# Patient Record
Sex: Male | Born: 1935 | Race: White | Hispanic: No | Marital: Married | Smoking: Former smoker
Health system: Southern US, Community
[De-identification: ages and names within clinical notes are randomized; demographics above are authoritative.]

## PROBLEM LIST (undated history)

## (undated) ENCOUNTER — Emergency Department (HOSPITAL_COMMUNITY): Admission: EM | Payer: PRIVATE HEALTH INSURANCE | Source: Home / Self Care

## (undated) DIAGNOSIS — K56609 Unspecified intestinal obstruction, unspecified as to partial versus complete obstruction: Secondary | ICD-10-CM

## (undated) DIAGNOSIS — H409 Unspecified glaucoma: Secondary | ICD-10-CM

## (undated) DIAGNOSIS — R112 Nausea with vomiting, unspecified: Secondary | ICD-10-CM

## (undated) DIAGNOSIS — Z860101 Personal history of adenomatous and serrated colon polyps: Secondary | ICD-10-CM

## (undated) DIAGNOSIS — E860 Dehydration: Secondary | ICD-10-CM

## (undated) DIAGNOSIS — N179 Acute kidney failure, unspecified: Secondary | ICD-10-CM

## (undated) DIAGNOSIS — I639 Cerebral infarction, unspecified: Secondary | ICD-10-CM

## (undated) DIAGNOSIS — E538 Deficiency of other specified B group vitamins: Secondary | ICD-10-CM

## (undated) DIAGNOSIS — H269 Unspecified cataract: Secondary | ICD-10-CM

## (undated) DIAGNOSIS — G459 Transient cerebral ischemic attack, unspecified: Secondary | ICD-10-CM

## (undated) DIAGNOSIS — T7840XA Allergy, unspecified, initial encounter: Secondary | ICD-10-CM

## (undated) DIAGNOSIS — E785 Hyperlipidemia, unspecified: Secondary | ICD-10-CM

## (undated) DIAGNOSIS — K219 Gastro-esophageal reflux disease without esophagitis: Secondary | ICD-10-CM

## (undated) DIAGNOSIS — Z86010 Personal history of colonic polyps: Secondary | ICD-10-CM

## (undated) DIAGNOSIS — E7211 Homocystinuria: Secondary | ICD-10-CM

## (undated) DIAGNOSIS — C61 Malignant neoplasm of prostate: Secondary | ICD-10-CM

## (undated) DIAGNOSIS — K5090 Crohn's disease, unspecified, without complications: Secondary | ICD-10-CM

## (undated) DIAGNOSIS — Z9889 Other specified postprocedural states: Secondary | ICD-10-CM

## (undated) DIAGNOSIS — K5790 Diverticulosis of intestine, part unspecified, without perforation or abscess without bleeding: Secondary | ICD-10-CM

## (undated) HISTORY — PX: VASECTOMY: SHX75

## (undated) HISTORY — DX: Unspecified cataract: H26.9

## (undated) HISTORY — DX: Crohn's disease, unspecified, without complications: K50.90

## (undated) HISTORY — DX: Dehydration: E86.0

## (undated) HISTORY — DX: Hyperlipidemia, unspecified: E78.5

## (undated) HISTORY — DX: Deficiency of other specified B group vitamins: E53.8

## (undated) HISTORY — DX: Personal history of adenomatous and serrated colon polyps: Z86.0101

## (undated) HISTORY — PX: TONSILLECTOMY: SUR1361

## (undated) HISTORY — PX: COLONOSCOPY: SHX174

## (undated) HISTORY — DX: Diverticulosis of intestine, part unspecified, without perforation or abscess without bleeding: K57.90

## (undated) HISTORY — PX: OTHER SURGICAL HISTORY: SHX169

## (undated) HISTORY — PX: APPENDECTOMY: SHX54

## (undated) HISTORY — DX: Unspecified intestinal obstruction, unspecified as to partial versus complete obstruction: K56.609

## (undated) HISTORY — DX: Homocystinuria: E72.11

## (undated) HISTORY — DX: Unspecified glaucoma: H40.9

## (undated) HISTORY — PX: CATARACT EXTRACTION: SUR2

## (undated) HISTORY — PX: CHOLECYSTECTOMY: SHX55

## (undated) HISTORY — PX: POLYPECTOMY: SHX149

## (undated) HISTORY — DX: Allergy, unspecified, initial encounter: T78.40XA

## (undated) HISTORY — DX: Gastro-esophageal reflux disease without esophagitis: K21.9

## (undated) HISTORY — DX: Personal history of colonic polyps: Z86.010

## (undated) HISTORY — DX: Malignant neoplasm of prostate: C61

---

## 1985-02-18 ENCOUNTER — Encounter: Payer: Self-pay | Admitting: Internal Medicine

## 2000-12-05 ENCOUNTER — Encounter: Payer: Self-pay | Admitting: Internal Medicine

## 2000-12-05 ENCOUNTER — Ambulatory Visit (HOSPITAL_COMMUNITY): Admission: RE | Admit: 2000-12-05 | Discharge: 2000-12-05 | Payer: Self-pay | Admitting: Internal Medicine

## 2003-01-14 ENCOUNTER — Ambulatory Visit (HOSPITAL_COMMUNITY): Admission: RE | Admit: 2003-01-14 | Discharge: 2003-01-14 | Payer: Self-pay | Admitting: Internal Medicine

## 2004-02-07 ENCOUNTER — Ambulatory Visit: Payer: Self-pay | Admitting: Internal Medicine

## 2004-07-31 ENCOUNTER — Ambulatory Visit: Payer: Self-pay | Admitting: Internal Medicine

## 2004-08-14 ENCOUNTER — Ambulatory Visit: Payer: Self-pay | Admitting: Internal Medicine

## 2004-08-14 ENCOUNTER — Encounter (INDEPENDENT_AMBULATORY_CARE_PROVIDER_SITE_OTHER): Payer: Self-pay | Admitting: *Deleted

## 2005-02-12 ENCOUNTER — Ambulatory Visit: Payer: Self-pay | Admitting: Internal Medicine

## 2005-02-26 ENCOUNTER — Emergency Department (HOSPITAL_COMMUNITY): Admission: EM | Admit: 2005-02-26 | Discharge: 2005-02-26 | Payer: Self-pay | Admitting: Emergency Medicine

## 2005-03-02 ENCOUNTER — Inpatient Hospital Stay (HOSPITAL_COMMUNITY): Admission: EM | Admit: 2005-03-02 | Discharge: 2005-03-03 | Payer: Self-pay | Admitting: Emergency Medicine

## 2005-03-14 ENCOUNTER — Ambulatory Visit: Payer: Self-pay

## 2005-03-14 ENCOUNTER — Encounter: Payer: Self-pay | Admitting: Cardiology

## 2006-03-14 ENCOUNTER — Ambulatory Visit: Payer: Self-pay | Admitting: Internal Medicine

## 2007-02-17 ENCOUNTER — Ambulatory Visit: Payer: Self-pay | Admitting: Internal Medicine

## 2007-02-17 LAB — CONVERTED CEMR LAB: Sed Rate: 24 mm/hr — ABNORMAL HIGH (ref 0–20)

## 2007-02-20 ENCOUNTER — Encounter: Payer: Self-pay | Admitting: Internal Medicine

## 2008-02-24 ENCOUNTER — Telehealth (INDEPENDENT_AMBULATORY_CARE_PROVIDER_SITE_OTHER): Payer: Self-pay | Admitting: *Deleted

## 2008-03-23 DIAGNOSIS — E785 Hyperlipidemia, unspecified: Secondary | ICD-10-CM | POA: Insufficient documentation

## 2008-03-23 DIAGNOSIS — Z862 Personal history of diseases of the blood and blood-forming organs and certain disorders involving the immune mechanism: Secondary | ICD-10-CM | POA: Insufficient documentation

## 2008-03-23 DIAGNOSIS — E538 Deficiency of other specified B group vitamins: Secondary | ICD-10-CM | POA: Insufficient documentation

## 2008-03-23 DIAGNOSIS — E7210 Disorders of sulfur-bearing amino-acid metabolism, unspecified: Secondary | ICD-10-CM | POA: Insufficient documentation

## 2008-03-23 DIAGNOSIS — K5090 Crohn's disease, unspecified, without complications: Secondary | ICD-10-CM | POA: Insufficient documentation

## 2008-03-23 DIAGNOSIS — Z8719 Personal history of other diseases of the digestive system: Secondary | ICD-10-CM

## 2008-03-23 DIAGNOSIS — Z86010 Personal history of colonic polyps: Secondary | ICD-10-CM | POA: Insufficient documentation

## 2008-03-28 ENCOUNTER — Ambulatory Visit: Payer: Self-pay | Admitting: Internal Medicine

## 2008-03-28 LAB — CONVERTED CEMR LAB
ALT: 14 units/L (ref 0–53)
AST: 18 units/L (ref 0–37)
Albumin: 3 g/dL — ABNORMAL LOW (ref 3.5–5.2)
Alkaline Phosphatase: 43 units/L (ref 39–117)
BUN: 12 mg/dL (ref 6–23)
Basophils Absolute: 0.1 10*3/uL (ref 0.0–0.1)
Basophils Relative: 0.7 % (ref 0.0–3.0)
CO2: 29 meq/L (ref 19–32)
Calcium: 8.8 mg/dL (ref 8.4–10.5)
Chloride: 108 meq/L (ref 96–112)
Creatinine, Ser: 1.2 mg/dL (ref 0.4–1.5)
Eosinophils Absolute: 0.1 10*3/uL (ref 0.0–0.7)
Eosinophils Relative: 1.3 % (ref 0.0–5.0)
GFR calc Af Amer: 77 mL/min
GFR calc non Af Amer: 63 mL/min
Glucose, Bld: 87 mg/dL (ref 70–99)
HCT: 38.9 % — ABNORMAL LOW (ref 39.0–52.0)
Hemoglobin: 13.4 g/dL (ref 13.0–17.0)
Iron: 158 ug/dL (ref 42–165)
Lymphocytes Relative: 25.4 % (ref 12.0–46.0)
MCHC: 34.4 g/dL (ref 30.0–36.0)
MCV: 99.9 fL (ref 78.0–100.0)
Monocytes Absolute: 0.5 10*3/uL (ref 0.1–1.0)
Monocytes Relative: 7.3 % (ref 3.0–12.0)
Neutro Abs: 4.7 10*3/uL (ref 1.4–7.7)
Neutrophils Relative %: 65.3 % (ref 43.0–77.0)
Platelets: 272 10*3/uL (ref 150–400)
Potassium: 4.1 meq/L (ref 3.5–5.1)
RBC: 3.89 M/uL — ABNORMAL LOW (ref 4.22–5.81)
RDW: 16.4 % — ABNORMAL HIGH (ref 11.5–14.6)
Saturation Ratios: 35.2 % (ref 20.0–50.0)
Sodium: 142 meq/L (ref 135–145)
Total Bilirubin: 1.2 mg/dL (ref 0.3–1.2)
Total Protein: 6 g/dL (ref 6.0–8.3)
Transferrin: 320.3 mg/dL (ref >=212.0)
Vitamin B-12: 1117 pg/mL — ABNORMAL HIGH (ref 211–911)
WBC: 7.3 10*3/uL (ref 4.5–10.5)

## 2008-09-19 ENCOUNTER — Encounter: Payer: Self-pay | Admitting: Internal Medicine

## 2009-01-03 ENCOUNTER — Ambulatory Visit: Payer: Self-pay | Admitting: Internal Medicine

## 2009-01-03 LAB — CONVERTED CEMR LAB: Vitamin B-12: 349 pg/mL (ref 211–911)

## 2009-01-04 LAB — CONVERTED CEMR LAB
Basophils Absolute: 0 10*3/uL (ref 0.0–0.1)
Basophils Relative: 0.7 % (ref 0.0–3.0)
Eosinophils Absolute: 0.1 10*3/uL (ref 0.0–0.7)
Eosinophils Relative: 1.4 % (ref 0.0–5.0)
HCT: 32.1 % — ABNORMAL LOW (ref 39.0–52.0)
Hemoglobin: 11.2 g/dL — ABNORMAL LOW (ref 13.0–17.0)
Iron: 39 ug/dL — ABNORMAL LOW (ref 42–165)
Lymphocytes Relative: 37.6 % (ref 12.0–46.0)
Lymphs Abs: 2.6 10*3/uL (ref 0.7–4.0)
MCHC: 34.9 g/dL (ref 30.0–36.0)
MCV: 94.1 fL (ref 78.0–100.0)
Monocytes Absolute: 0.7 10*3/uL (ref 0.1–1.0)
Monocytes Relative: 9.7 % (ref 3.0–12.0)
Neutro Abs: 3.5 10*3/uL (ref 1.4–7.7)
Neutrophils Relative %: 50.6 % (ref 43.0–77.0)
Platelets: 270 10*3/uL (ref 150.0–400.0)
RBC: 3.42 M/uL — ABNORMAL LOW (ref 4.22–5.81)
RDW: 15.6 % — ABNORMAL HIGH (ref 11.5–14.6)
Saturation Ratios: 8 % — ABNORMAL LOW (ref 20.0–50.0)
Transferrin: 349.3 mg/dL (ref 212.0–360.0)
WBC: 6.9 10*3/uL (ref 4.5–10.5)

## 2009-01-05 ENCOUNTER — Encounter: Payer: Self-pay | Admitting: Internal Medicine

## 2009-02-27 ENCOUNTER — Ambulatory Visit: Payer: Self-pay | Admitting: Internal Medicine

## 2009-02-28 LAB — CONVERTED CEMR LAB
Basophils Absolute: 0 10*3/uL (ref 0.0–0.1)
Basophils Relative: 0.3 % (ref 0.0–3.0)
Eosinophils Absolute: 0.1 10*3/uL (ref 0.0–0.7)
Eosinophils Relative: 1.4 % (ref 0.0–5.0)
HCT: 36.3 % — ABNORMAL LOW (ref 39.0–52.0)
Hemoglobin: 12.2 g/dL — ABNORMAL LOW (ref 13.0–17.0)
Iron: 148 ug/dL (ref 42–165)
Lymphocytes Relative: 27 % (ref 12.0–46.0)
Lymphs Abs: 1.9 10*3/uL (ref 0.7–4.0)
MCHC: 33.7 g/dL (ref 30.0–36.0)
MCV: 99 fL (ref 78.0–100.0)
Monocytes Absolute: 0.6 10*3/uL (ref 0.1–1.0)
Monocytes Relative: 9.1 % (ref 3.0–12.0)
Neutro Abs: 4.4 10*3/uL (ref 1.4–7.7)
Neutrophils Relative %: 62.2 % (ref 43.0–77.0)
Platelets: 214 10*3/uL (ref 150.0–400.0)
RBC: 3.67 M/uL — ABNORMAL LOW (ref 4.22–5.81)
RDW: 17.5 % — ABNORMAL HIGH (ref 11.5–14.6)
Saturation Ratios: 33.2 % (ref 20.0–50.0)
Transferrin: 318.6 mg/dL (ref 212.0–360.0)
WBC: 7 10*3/uL (ref 4.5–10.5)

## 2009-03-17 ENCOUNTER — Encounter: Payer: Self-pay | Admitting: Internal Medicine

## 2009-04-04 ENCOUNTER — Telehealth: Payer: Self-pay | Admitting: Internal Medicine

## 2009-06-19 ENCOUNTER — Encounter: Payer: Self-pay | Admitting: Internal Medicine

## 2009-08-02 ENCOUNTER — Encounter (INDEPENDENT_AMBULATORY_CARE_PROVIDER_SITE_OTHER): Payer: Self-pay | Admitting: *Deleted

## 2009-09-18 ENCOUNTER — Ambulatory Visit: Payer: Self-pay | Admitting: Internal Medicine

## 2009-09-18 LAB — CONVERTED CEMR LAB
ALT: 12 units/L (ref 0–53)
AST: 19 units/L (ref 0–37)
Albumin: 3.1 g/dL — ABNORMAL LOW (ref 3.5–5.2)
Alkaline Phosphatase: 41 units/L (ref 39–117)
BUN: 12 mg/dL (ref 6–23)
Basophils Absolute: 0 10*3/uL (ref 0.0–0.1)
Basophils Relative: 0.3 % (ref 0.0–3.0)
CO2: 28 meq/L (ref 19–32)
Calcium: 8.6 mg/dL (ref 8.4–10.5)
Chloride: 107 meq/L (ref 96–112)
Creatinine, Ser: 1.1 mg/dL (ref 0.4–1.5)
Eosinophils Absolute: 0.1 10*3/uL (ref 0.0–0.7)
Eosinophils Relative: 1.2 % (ref 0.0–5.0)
GFR calc non Af Amer: 69.66 mL/min (ref >60)
Glucose, Bld: 101 mg/dL — ABNORMAL HIGH (ref 70–99)
HCT: 33 % — ABNORMAL LOW (ref 39.0–52.0)
Hemoglobin: 11.4 g/dL — ABNORMAL LOW (ref 13.0–17.0)
Iron: 413 ug/dL — ABNORMAL HIGH (ref 42–165)
Lymphocytes Relative: 30.2 % (ref 12.0–46.0)
Lymphs Abs: 2.1 10*3/uL (ref 0.7–4.0)
MCHC: 34.6 g/dL (ref 30.0–36.0)
MCV: 98.6 fL (ref 78.0–100.0)
Monocytes Absolute: 0.5 10*3/uL (ref 0.1–1.0)
Monocytes Relative: 7.1 % (ref 3.0–12.0)
Neutro Abs: 4.3 10*3/uL (ref 1.4–7.7)
Neutrophils Relative %: 61.2 % (ref 43.0–77.0)
Platelets: 214 10*3/uL (ref 150.0–400.0)
Potassium: 3.8 meq/L (ref 3.5–5.1)
RBC: 3.35 M/uL — ABNORMAL LOW (ref 4.22–5.81)
RDW: 16.4 % — ABNORMAL HIGH (ref 11.5–14.6)
Saturation Ratios: 95.5 % — ABNORMAL HIGH (ref 20.0–50.0)
Sodium: 141 meq/L (ref 135–145)
Total Bilirubin: 1.3 mg/dL — ABNORMAL HIGH (ref 0.3–1.2)
Total Protein: 5.7 g/dL — ABNORMAL LOW (ref 6.0–8.3)
Transferrin: 308.9 mg/dL (ref 212.0–360.0)
Vitamin B-12: 447 pg/mL (ref 211–911)
WBC: 6.9 10*3/uL (ref 4.5–10.5)

## 2009-09-27 ENCOUNTER — Ambulatory Visit: Payer: Self-pay | Admitting: Internal Medicine

## 2009-09-30 ENCOUNTER — Encounter: Payer: Self-pay | Admitting: Internal Medicine

## 2009-11-23 ENCOUNTER — Encounter: Payer: Self-pay | Admitting: Internal Medicine

## 2009-11-29 ENCOUNTER — Encounter: Payer: Self-pay | Admitting: Internal Medicine

## 2009-12-26 ENCOUNTER — Encounter: Payer: Self-pay | Admitting: Internal Medicine

## 2010-03-13 ENCOUNTER — Encounter: Payer: Self-pay | Admitting: Internal Medicine

## 2010-03-23 ENCOUNTER — Telehealth (INDEPENDENT_AMBULATORY_CARE_PROVIDER_SITE_OTHER): Payer: Self-pay | Admitting: *Deleted

## 2010-03-26 ENCOUNTER — Telehealth: Payer: Self-pay | Admitting: Internal Medicine

## 2010-03-28 ENCOUNTER — Encounter: Payer: Self-pay | Admitting: Internal Medicine

## 2010-03-31 ENCOUNTER — Encounter: Payer: Self-pay | Admitting: Neurology

## 2010-04-10 NOTE — Letter (Signed)
Summary: Colonoscopy Letter  [facility]  [place], [place] [address]   Phone: [phone]  Fax: [phone]      Aug 02, 2009 MRN: [id number]   Ricardo Villa [address] [place], [place]  [address]   Dear Mr. STARY,   According to your medical record, it is time for you to schedule a Colonoscopy. The American Cancer Society recommends this procedure as a method to detect early colon cancer. Patients with a family history of colon cancer, or a personal history of colon polyps or inflammatory bowel disease are at increased risk.  This letter has beeen generated based on the recommendations made at the time of your procedure. If you feel that in your particular situation this may no longer apply, please contact our office.  Please call our office at [phone] to schedule this appointment or to update your records at your earliest convenience.  Thank you for cooperating with [place] to provide you with the very best care possible.   Sincerely,  Lowella Bandy. Olevia Perches, M.D.  [facility] [phone]

## 2010-04-10 NOTE — Progress Notes (Signed)
Summary: TRIAGE  Phone Note Call from Patient Call back at Home Phone [phone]   Call For: Dr Olevia Perches Complaint: Cough/Sore throat Summary of Call: Pt. is scheduled to take a shingles shot but was told it might not work because he is on mercatopurine 95m.  Initial call taken by: YIrwin BrakemanPSt. [facility]  April 04, 2009 10:32 AM  Follow-up for Phone Call        Pt. states he won't take the shot if Dr.Viki Carrera advised against it.  DR.Cato Liburd PLEASE ADVISE  Follow-up by: DVivia EwingLPN,  January 25, 2657910:41 AM  Additional Follow-up for Phone Call Additional follow up Details #1::        He is right, but very unlikely scanario. He should  take the shot. He will likely develop an immunity. Additional Follow-up by: DLafayette DragonMD,  April 04, 2009 1:35 PM    Additional Follow-up for Phone Call Additional follow up Details #2::    Above MD orders reviewed with patient's wife. Pt. instructed to call back as needed.  Follow-up by: DVivia EwingLPN,  January 25, 203832:07 PM

## 2010-04-10 NOTE — Letter (Signed)
Summary: Patient Notice- Polyp Results  [facility]  [address] [place], [place] [address]   Phone: [phone]  Fax: [phone]        September 30, 2009 MRN: [id number]    Ricardo Villa [address] [place], [place]  [address]    Dear [name],  I am pleased to inform you that the colon polyp(s) removed during your recent colonoscopy was (were) found to be benign (no cancer detected) upon pathologic examination.The polyp was tubovillous ( premalignant), there was no evidence of active Crohn's disease.Since is no active Crohn's disease I would like to reduce Your 6MP ( Mercaptopurine) to 25 mg/day ( 1/2 of the pill) instead of 50 mg a day ( 1 pill)  I recommend you have a repeat colonoscopy examination in 5_ years to look for recurrent polyps, as having colon polyps increases your risk for having recurrent polyps or even colon cancer in the future.  Should you develop new or worsening symptoms of abdominal pain, bowel habit changes or bleeding from the rectum or bowels, please schedule an evaluation with either your primary care physician or with me.  Additional information/recommendations:  __ No further action with gastroenterology is needed at this time. Please      follow-up with your primary care physician for your other healthcare      needs.  _x_ Please call [phone] to sche  _x_ Please come to see me in 6 months.  __ Continue treatment plan as outlined the day of your exam.  Please call us if you are having persistent problems or have questions about your condition that have not been fully answered at this time.  Sincerely,  Lafayette Dragon MD  This letter has been electronically signed by your physician.  Appended Document: Patient Notice- Polyp Results letter mailed

## 2010-04-10 NOTE — Letter (Signed)
Summary: [facility] Instructions  [facility]  [place], [place] [address]   Phone: [phone]  Fax: [phone]       Ricardo Villa    [birth year]    MRN: [id number]       Procedure Day Sudie Grumbling: Wednesday 09/27/09     Arrival Time: 7:30 am     Procedure Time: 8:00 am     Location of Procedure:                    _ x_  [place] (4th Floor)  [place]  Starting 5 days prior to your procedure (09/22/09) do not eat nuts, seeds, popcorn, corn, beans, peas,  salads, or any raw vegetables.  Do not take any fiber supplements (e.g. Metamucil, Citrucel, and Benefiber). ____________________________________________________________________________________________________   THE DAY BEFORE YOUR PROCEDURE         DATE: 09/26/09 DAY: Tuesday  1   Drink clear liquids the entire day-NO SOLID FOOD  2   Do not drink anything colored red or purple.  Avoid juices with pulp.  No orange juice.  3   Drink at least 64 oz. (8 glasses) of fluid/clear liquids during the day to prevent dehydration and help the prep work efficiently.  CLEAR LIQUIDS INCLUDE: Water Jello Ice Popsicles Tea (sugar ok, no milk/cream) Powdered fruit flavored drinks Coffee (sugar ok, no milk/cream) Gatorade Juice: apple, white grape, white cranberry  Lemonade Clear bullion, consomm, broth Carbonated beverages (any kind) Strained chicken noodle soup Hard Candy  4   Mix the entire bottle of Miralax with 64 oz. of Gatorade/Powerade in the morning and put in the refrigerator to chill.  5   At 3:00 pm take 2 Dulcolax/Bisacodyl tablets.  6   At 4:30 pm take one Reglan/Metoclopramide tablet.  7  Starting at 5:00 pm drink one 8 oz glass of the Miralax mixture every 15-20 minutes until you have finished drinking the entire 64 oz.  You should finish drinking prep around 7:30 or 8:00 pm.  8   If you are nauseated, you may take the 2nd Reglan/Metoclopramide  tablet at 6:30 pm.        9    At 8:00 pm take 2 more DULCOLAX/Bisacodyl tablets.        THE DAY OF YOUR PROCEDURE      DATE:  09/27/09 DAY: Wednesday  You may drink clear liquids until 6:00 am  (2 HOURS BEFORE PROCEDURE).   MEDICATION INSTRUCTIONS  Unless otherwise instructed, you should take regular prescription medications with a small sip of water as early as possible the morning of your procedure.   Stop taking Plavix or Aggrenox on  09/20/09 Per Dr [name]  (7 days before procedure).  HOLD IRON BEGINNING 10/08/09 PER DR Leydi Winstead        OTHER INSTRUCTIONS  You will need a responsible adult at least 74 years of age to accompany you and drive you home.   This person must remain in the waiting room during your procedure.  Wear loose fitting clothing that is easily removed.  Leave jewelry and other valuables at home.  However, you may wish to bring a book to read or an iPod/MP3 player to listen to music as you wait for your procedure to start.  Remove all body piercing jewelry and leave at home.  Total time from sign-in until discharge is approximately 2-3 hours.  You should go home directly after your procedure and  rest.  You can resume normal activities the day after your procedure.  The day of your procedure you should not:   Drive   Make legal decisions   Operate machinery   Drink alcohol   Return to work  You will receive specific instructions about eating, activities and medications before you leave.   The above instructions have been reviewed and explained to me by  Madlyn Frankel CMA Deborra Medina)  September 18, 2009 11:40 AM     I fully understand and can verbalize these instructions _____________________________ Date 09/18/09  Appended Document: Miralax Instructions After patient left, I realized that Dr Olevia Perches had written in her note that patient was only to be off Plavix x 5 days prior to his procedure (we normally hold Plavix x 7 days). I spoke with  Dr Olevia Perches and she states that patients urologist only held patient's Plavix x 3 days prior to surgery so we will go between 3 and 7 days and make him hold Plavix x only 5 days. I have called and spoken to Mr Dunshee to advise him of the above. He states that he will adjust the date of his discontinuation of Plavix to correspond with our wishes.

## 2010-04-10 NOTE — Miscellaneous (Signed)
Summary: Lomotil Refill  Clinical Lists Changes  Medications: Rx of LOMOTIL 2.5-0.025 MG  TABS (DIPHENOXYLATE-ATROPINE) Take 1  tablet by mouth once daily as directed;  #30 x 2;  Signed;  Entered by: Awilda Bill CMA (AAMA);  Authorized by: Lafayette Dragon MD;  Method used: Printed then faxed to [facility]*, [address] [place], [place]  [id number], Ph: [phone] or [phone], Fax: [phone]    Prescriptions: LOMOTIL 2.5-0.025 MG  TABS (DIPHENOXYLATE-ATROPINE) Take 1  tablet by mouth once daily as directed  #30 x 2   Entered by:   Awilda Bill CMA ([place])   Authorized by:   Lafayette Dragon MD   Signed by:   Awilda Bill CMA ([place]) on 03/17/2009   Method used:   Printed then faxed to ...       [facility] (retail)       [address] [place], [place]  [id number]       Ph: [phone] or [phone]       Fax: [phone]   RxID:   [id number]

## 2010-04-10 NOTE — Assessment & Plan Note (Signed)
Summary: CONSULT BEFORE COL PT ON PLAVIX.Marland KitchenMarland KitchenEM   History of Present Illness Visit Type: Follow-up Visit Primary GI MD: [name] MD Primary Provider: na Requesting Provider: na Chief Complaint: Consult for colon. Pt denies any GI complaints History of Present Illness:   This is a 76 year old white male with Crohn's disease of the terminal ileum who is status post terminal ileum resection in 1988 and again in 1995. He has been on Plavix since  hospitalization for TIA in December 2006. He has not seen Dr.Reynolds, his neurologist,  for several years. He has not had any episodes of dizziness. He has been diagnosed with prostate cancer. There is a history of adenomatous polyps of the colon on his last colonoscopy in 2006. He has a history of erythema nodosum in 1986. He is status post laparoscopic cholecystectomy in 1983. A CT Scan of the abdomen in 2004 showed a thickened small bowel in the terminal ileum c/w recurrent Crohn's disease,  the last colonoscopy also showed recurrence of the Crohn's disease at the anastomosis. He has been chronically anemic, taking iron supplements. His last hemoglobin in December 2010 was 12.2.   GI Review of Systems      Denies abdominal pain, acid reflux, belching, bloating, chest pain, dysphagia with liquids, dysphagia with solids, heartburn, loss of appetite, nausea, vomiting, vomiting blood, weight loss, and  weight gain.        Denies anal fissure, black tarry stools, change in bowel habit, constipation, diarrhea, diverticulosis, fecal incontinence, heme positive stool, hemorrhoids, irritable bowel syndrome, jaundice, light color stool, liver problems, rectal bleeding, and  rectal pain.    Current Medications (verified): 1)  Mercaptopurine 50 Mg Tabs (Mercaptopurine) .... Take 1 & 1/2 Tablet By Mouth Once A Day 2)  Lomotil 2.5-0.025 Mg  Tabs (Diphenoxylate-Atropine) .... Take 1  Tablet By Mouth Once Daily As Directed. Must Have Colonoscopy For Further  Refills! 3)  Tricor 48 Mg Tabs (Fenofibrate) .... Take 1 Tablet By Mouth Once A Day 4)  Plavix 75 Mg Tabs (Clopidogrel Bisulfate) .... Take 1 Tablet By Mouth Once A Day 5)  Ferrous Sulfate 325 (65 Fe) Mg Tabs (Ferrous Sulfate) .... One Tablet By Mouth Once Daily  Allergies (verified): No Known Drug Allergies  Past History:  Past Medical History: Reviewed history from 01/03/2009 and no changes required. 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 Cholecystectomy regional TI resection  appendectomy  Family History: Family History of Inflammatory Bowel Disease: Mother (crohns) Family History of Heart Disease: Father Family History of Diabetes: Paternal Grandfather No FH of Colon Cancer:  Social History: Reviewed history from 03/28/2008 and no changes required. Married, 2 girls Alcohol Use - yes Illicit Drug Use - no Patient is a former smoker. Quit 15 yrs ago  Daily Caffeine Use 2 cups coffee/day  Review of Systems  The patient denies allergy/sinus, anemia, anxiety-new, arthritis/joint pain, back pain, blood in urine, breast changes/lumps, change in vision, confusion, cough, coughing up blood, depression-new, fainting, fatigue, fever, headaches-new, hearing problems, heart murmur, heart rhythm changes, itching, menstrual pain, muscle pains/cramps, night sweats, nosebleeds, pregnancy symptoms, shortness of breath, skin rash, sleeping problems, sore throat, swelling of feet/legs, swollen lymph glands, thirst - excessive , urination - excessive , urination changes/pain, urine leakage, vision changes, and voice change.  Pertinent positive and  negative review of systems were noted in the above HPI. All other ROS was otherwise negative.   Vital Signs:  Patient profile:   76 year old male Height:      73 inches Weight:      168 pounds BMI:     22.25 BSA:     2.00 Pulse rate:   76 / minute Pulse rhythm:   regular BP sitting:   124 / 68  (left arm) Cuff size:   regular  Vitals Entered By: Hope Pigeon CMA (September 18, 2009 11:09 AM)   Impression & Recommendations:  Problem # 1:  COLONIC POLYPS, ADENOMATOUS, HX OF (ICD-V12.72) Patient had 2 adenomatous polyps on his last colonoscopy in 2006. Patient is due for a repeat colonoscopy at this time. He will discontinue Plavix for 5 days prior to the procedure. He will also stop his iron supplements.  Orders: Colonoscopy (Colon)  Problem # 2:  SMALL BOWEL OBSTRUCTION, HX OF (ICD-V12.79) There are no current symptoms of obstruction.  Problem # 3:  IRON DEFICIENCY ANEMIA, HX OF (ICD-V12.3) We will recheck a CBC and iron studies today.  Orders: TLB-CBC Platelet - w/Differential (85025-CBCD) TLB-CMP (Comprehensive Metabolic Pnl) (55374-MOLM) TLB-IBC Pnl (Iron/FE;Transferrin) (83550-IBC) TLB-B12, Serum-Total ONLY (78675-Q49) Colonoscopy (Colon)  Problem # 4:  VITAMIN B12 DEFICIENCY (ICD-266.2) Patient is on a monthly B12 supplement.  Orders: TLB-B12, Serum-Total ONLY (20100-F12)  Problem # 5:  CROHN'S DISEASE (ICD-555.9) Patient is asymptomatic at this time. He had an episode of diarrhea 2 weeks ago which subsided spontaneously. He is due for a colonoscopy at this time.  Orders: TLB-CBC Platelet - w/Differential (85025-CBCD) TLB-CMP (Comprehensive Metabolic Pnl) (19758-ITGP) TLB-IBC Pnl (Iron/FE;Transferrin) (83550-IBC) Colonoscopy (Colon)  Patient Instructions: 1)  Patient will be scheduled for a recall colonoscopy off Plavix. 2)  I encouraged patient to see Dr.Reynolds for followup of his TIA to see whether he still needs to stay on Plavix. 3)  Continue 6-MP 75 mg  daily. 4)  CBC, CMET, iron studies and B12 today. 5)  Refills on Diphenoxylate. 6)  Copy sent to: Dr Doy Mince, Neurology 7)  The medication list was reviewed and reconciled.  All changed / newly prescribed medications were explained.  A complete medication list was provided to the patient / caregiver. Prescriptions: LOMOTIL 2.5-0.025 MG  TABS (DIPHENOXYLATE-ATROPINE) Take 1  tablet by mouth once daily as directed.  #30 x 2   Entered by:   Madlyn Frankel CMA ([place])   Authorized by:   Lafayette Dragon MD   Signed by:   [place] ([place]) on 09/18/2009   Method used:   Printed then faxed to ...       [facility] (retail)       [address] [place], [place]  [id number]       Ph: [phone] or [phone]       Fax: [phone]   RxID:   [id number] DULCOLAX 5 MG  TBEC (BISACODYL) Day before procedure take 2 at 3pm and 2 at 8pm.  #4 x 0   Entered by:   Madlyn Frankel CMA ([place])   Authorized by:   Lafayette Dragon MD   Signed by:   [place] ([place]) on 09/18/2009   Method used:   Electronically to        [facility] (retail)       [address] [place], [place]  [id number]       Ph: [phone] or [phone]       Fax: [phone]   RxID:   [id number] REGLAN 10 MG  TABS (METOCLOPRAMIDE HCL) As per prep instructions.  #2 x 0   Entered by:   Madlyn Frankel CMA (AAMA)   Authorized by:   Lafayette Dragon MD   Signed by:   [place] ([place]) on 09/18/2009   Method used:   Electronically to        [facility] (retail)       [address] [place], [place]  [id number]       Ph: [phone] or [phone]       Fax: [phone]   RxID:   [id number] MIRALAX   POWD (POLYETHYLENE GLYCOL 3350) As per prep  instructions.  #255gm x 0   Entered by:   Madlyn Frankel CMA (AAMA)   Authorized by:   Lafayette Dragon MD   Signed by:   [place] ([place]) on 09/18/2009   Method  used:   Electronically to        [facility] (retail)       [address] [place], [place]  [id number]       Ph: [phone] or [phone]       Fax: [phone]   RxID:   [id number]

## 2010-04-10 NOTE — Miscellaneous (Signed)
Summary: Lomotil Rx  Clinical Lists Changes  Medications: Changed medication from LOMOTIL 2.5-0.025 MG  TABS (DIPHENOXYLATE-ATROPINE) Take 1  tablet by mouth once daily as directed to LOMOTIL 2.5-0.025 MG  TABS (DIPHENOXYLATE-ATROPINE) Take 1  tablet by mouth once daily as directed. MUST HAVE COLONOSCOPY FOR FURTHER REFILLS! - Signed Rx of LOMOTIL 2.5-0.025 MG  TABS (DIPHENOXYLATE-ATROPINE) Take 1  tablet by mouth once daily as directed. MUST HAVE COLONOSCOPY FOR FURTHER REFILLS!;  #30 x 1;  Signed;  Entered by: Awilda Bill CMA (AAMA);  Authorized by: Lafayette Dragon MD;  Method used: Printed then faxed to [facility]*, [address] [place], [place]  [id number], Ph: [phone] or [phone], Fax: [phone]    Prescriptions: LOMOTIL 2.5-0.025 MG  TABS (DIPHENOXYLATE-ATROPINE) Take 1  tablet by mouth once daily as directed. MUST HAVE COLONOSCOPY FOR FURTHER REFILLS!  #30 x 1   Entered by:   Awilda Bill CMA ([place])   Authorized by:   Lafayette Dragon MD   Signed by:   Awilda Bill CMA ([place]) on 06/19/2009   Method used:   Printed then faxed to ...       [facility] (retail)       [address] [place], [place]  [address]       Ph: [phone] or [phone]       Fax: [phone]   RxID:   [id number]

## 2010-04-10 NOTE — Procedures (Signed)
Summary: Colonoscopy  Patient: [name] Note: All result statuses are Final unless otherwise noted.  Tests: (1) Colonoscopy (COL)   COL Colonoscopy           [facility]     [place], [place]  [address]           COLONOSCOPY PROCEDURE REPORT           PATIENT:  Ricardo Villa, Ricardo Villa  MR#:  [id number]     BIRTHDATE:  19-Apr-1934, 75 yrs. old  GENDER:  male     ENDOSCOPIST:  Lowella Bandy. Olevia Perches, MD     REF. BY:     PROCEDURE DATE:  09/27/2009     PROCEDURE:  Colonoscopy 82[address]     ASA CLASS:  Class II     INDICATIONS:  Crohn's disease Crohn's since 1988, s/p TI resection     71696 and 1995,     last colon 2006- adenomatous polyp at the anastomosis     MEDICATIONS:   Versed 7 mg, Fentanyl 75 mcg           DESCRIPTION OF PROCEDURE:   After the risks benefits and     alternatives of the procedure were thoroughly explained, informed     consent was obtained.  Digital rectal exam was performed and     revealed no rectal masses.   The LB [id number] endoscope     was introduced through the anus and advanced to the terminal ileum     which was intubated for a short distance, without limitations.     The quality of the prep was good, using MiraLax.  The instrument     was then slowly withdrawn as the colon was fully examined.     <<PROCEDUREIMAGES>>           FINDINGS:  The right colon was surgically resected and an     ileo-colonic anastamosis was seen (see image1, image6, and     image7). open anastomosis,  A sessile polyp was found. at the     anastomosis, 20 mm, soft, villous, retrieved in 2 pieces Polyp was     snared, then cauterized with monopolar cautery. Retrieval was     successful (see image5, image4, image3, and image2). snare polyp     This was otherwise a normal examination of the colon (see image8).     Retroflexed views in the rectum revealed no abnormalities.    The     scope was then withdrawn from the patient and the  procedure     completed.           COMPLICATIONS:  None     ENDOSCOPIC IMPRESSION:     1) Prior right hemi-colectomy     2) Sessile polyp     3) Otherwise normal examination     RECOMMENDATIONS:     1) Await pathology results     continue 6MP     , follow H/H, continue Iron supplements     REPEAT EXAM:  In 5 year(s) for.           ______________________________     Lowella Bandy. Olevia Perches, MD           CC:           n.     eSIGNED:   Lowella Bandy. Famous Eisenhardt at 09/27/2009 09:10 AM           [name],  [name], [id number]  Note: An exclamation mark (!) indicates a result that was not dispersed into the flowsheet. Document Creation Date: 09/27/2009 9:12 AM _______________________________________________________________________  (1) Order result status: Final Collection or observation date-time: 09/27/2009 08:55 Requested date-time:  Receipt date-time:  Reported date-time:  Referring Physician:   Ordering Physician: [name] [phone]) Specimen Source:  Source: Tawanna Cooler Order Number: [id number] Lab site:   Appended Document: Colonoscopy     Procedures Next Due Date:    Colonoscopy: 10/2014

## 2010-04-12 NOTE — Miscellaneous (Signed)
Summary: Lomoti refill  Clinical Lists Changes  Medications: Rx of LOMOTIL 2.5-0.025 MG  TABS (DIPHENOXYLATE-ATROPINE) Take 1  tablet by mouth once daily as directed.;  #30 x 0;  Signed;  Entered by: Marlon Pel CMA (AAMA);  Authorized by: Lafayette Dragon MD;  Method used: Printed then faxed to [facility]*, [address], [place], [place]  [id number], Ph: [phone] or [phone], Fax: [phone]    Prescriptions: LOMOTIL 2.5-0.025 MG  TABS (DIPHENOXYLATE-ATROPINE) Take 1  tablet by mouth once daily as directed.  #30 x 0   Entered by:   Marlon Pel CMA ([place])   Authorized by:   Lafayette Dragon MD   Signed by:   Marlon Pel CMA ([place]) on 03/13/2010   Method used:   Printed then faxed to ...       [facility] (retail)       [address] [place], [place]  [id number]       Ph: [phone] or [phone]       Fax: [phone]   RxID:   [id number]

## 2010-04-12 NOTE — Medication Information (Signed)
Summary: Diphenoxylate/[facility]  Diphenoxylate/[facility]   Imported By: Phillis Knack 04/04/2010 09:23:15  _____________________________________________________________________  External Attachment:    Type:   Image     Comment:   External Document

## 2010-04-12 NOTE — Progress Notes (Signed)
Summary: Returning Regina's call  Phone Note Call from Patient Call back at [facility] Phone [phone]   Call For: Dr Olevia Perches Summary of Call: Returning Call Initial call taken by: Irwin Brakeman [facility],  March 26, 2010 8:30 AM  Follow-up for Phone Call        Notified patient of Regina/Dotty's suggestion of ordering his Lomotil early so we can work on Prior [employer]. or appeal if necessay. Per patient request, called Maggie Font to request insurance rejection to obtain the number. Follow-up by: Shella Maxim RN,  March 26, 2010 10:05 AM

## 2010-04-12 NOTE — Progress Notes (Signed)
  Phone Note Outgoing Call Call back at Home Phone [phone]   Call placed by: Leone Payor, RN Call placed to: Patient Summary of Call: Patient came by today with insurance letter that his insurance was denying his Lomotil.Spoke with Carla Drape and she suggested that patient order his medication about a week prior to when he needs it. The pharmacy will then call her and she can get a prior auth on the medication. Left a message at patient's home number to call back so I could give him this information. Initial call taken by: Leone Payor RN,  March 23, 2010 4:48 PM  Follow-up for Phone Call        prescription sent and process started Follow-up by: Madlyn Frankel CMA Deborra Medina),  March 26, 2010 3:01 PM

## 2010-04-20 ENCOUNTER — Encounter (INDEPENDENT_AMBULATORY_CARE_PROVIDER_SITE_OTHER): Payer: Self-pay | Admitting: *Deleted

## 2010-04-20 ENCOUNTER — Other Ambulatory Visit: Payer: Self-pay | Admitting: Internal Medicine

## 2010-04-20 ENCOUNTER — Other Ambulatory Visit: Payer: PRIVATE HEALTH INSURANCE

## 2010-04-20 DIAGNOSIS — Z862 Personal history of diseases of the blood and blood-forming organs and certain disorders involving the immune mechanism: Secondary | ICD-10-CM

## 2010-04-20 DIAGNOSIS — Z79899 Other long term (current) drug therapy: Secondary | ICD-10-CM

## 2010-04-20 LAB — CBC WITH DIFFERENTIAL/PLATELET
Basophils Absolute: 0 10*3/uL (ref 0.0–0.1)
Basophils Relative: 0.2 % (ref 0.0–3.0)
Eosinophils Absolute: 0.1 10*3/uL (ref 0.0–0.7)
Eosinophils Relative: 1.2 % (ref 0.0–5.0)
HCT: 37.6 % — ABNORMAL LOW (ref 39.0–52.0)
Hemoglobin: 12.8 g/dL — ABNORMAL LOW (ref 13.0–17.0)
Lymphocytes Relative: 25.5 % (ref 12.0–46.0)
Lymphs Abs: 1.9 10*3/uL (ref 0.7–4.0)
MCHC: 33.9 g/dL (ref 30.0–36.0)
MCV: 92.4 fl (ref 78.0–100.0)
Monocytes Absolute: 0.5 10*3/uL (ref 0.1–1.0)
Monocytes Relative: 7.4 % (ref 3.0–12.0)
Neutro Abs: 4.9 10*3/uL (ref 1.4–7.7)
Neutrophils Relative %: 65.7 % (ref 43.0–77.0)
Platelets: 243 10*3/uL (ref 150.0–400.0)
RBC: 4.07 Mil/uL — ABNORMAL LOW (ref 4.22–5.81)
RDW: 15.8 % — ABNORMAL HIGH (ref 11.5–14.6)
WBC: 7.4 10*3/uL (ref 4.5–10.5)

## 2010-04-20 LAB — VITAMIN B12: Vitamin B-12: 658 pg/mL (ref 211–911)

## 2010-04-20 LAB — FERRITIN: Ferritin: 20.9 ng/mL — ABNORMAL LOW (ref 22.0–322.0)

## 2010-04-20 LAB — IBC PANEL
Iron: 94 ug/dL (ref 42–165)
Saturation Ratios: 19.4 % — ABNORMAL LOW (ref 20.0–50.0)
Transferrin: 345.8 mg/dL (ref 212.0–360.0)

## 2010-04-20 LAB — HEPATIC FUNCTION PANEL
ALT: 14 U/L (ref 0–53)
AST: 24 U/L (ref 0–37)
Albumin: 3.1 g/dL — ABNORMAL LOW (ref 3.5–5.2)
Alkaline Phosphatase: 39 U/L (ref 39–117)
Bilirubin, Direct: 0.1 mg/dL (ref 0.0–0.3)
Total Bilirubin: 1 mg/dL (ref 0.3–1.2)
Total Protein: 5.6 g/dL — ABNORMAL LOW (ref 6.0–8.3)

## 2010-06-07 ENCOUNTER — Other Ambulatory Visit: Payer: Self-pay | Admitting: Internal Medicine

## 2010-07-16 ENCOUNTER — Other Ambulatory Visit: Payer: Self-pay | Admitting: *Deleted

## 2010-07-16 MED ORDER — DIPHENOXYLATE-ATROPINE 2.5-0.025 MG PO TABS: 1.0000 | ORAL_TABLET | Freq: Every day | ORAL | Status: DC

## 2010-07-23 ENCOUNTER — Other Ambulatory Visit: Payer: Self-pay | Admitting: Dermatology

## 2010-07-24 NOTE — Assessment & Plan Note (Signed)
[facility]                         GASTROENTEROLOGY OFFICE NOTE   NAME:Ricardo Villa, Ricardo Villa                   MRN:          [id number]  DATE:02/17/2007                            DOB:          1934/09/30    Ricardo Villa is a 76 year old gentleman with Crohn disease of distal  ileum, status post terminal ileal resection and lysis of adhesions in  1995.  He had a regional TI resection in 1988 at the time of incidental  appendectomy.  He also had a cholecystectomy in 1983.  We have followed  him now for about 20 years, and his disease has been stable in the last  13 years, although he has intermittent small bowel obstruction, which  usually resolves on its own at home.  We have put him on 6-  mercaptopurine 75 mg a day, which has controlled the disease quite well,  and his blood levels of the 6PGN metabolites were therapeutic, while the  6MMPN metabolites for hepatotoxicity were very low.  He is followed by  Dr. Maylon Peppers, and has an upcoming complete physical in January 2009.  Last colonoscopy was done in June 2006, and showed Crohn disease at the  ileocolic anastomosis.  We were able to discontinue his mesalamine last  year, because the literature shows no change in the prevention of the  terminal ileum disease with mesalamine.   PHYSICAL EXAM:  Blood pressure 118/60, pulse 80 and weight 169 pounds,  which is a 7-pound loss since last year.  He is alert, oriented.  LUNGS:  Clear to auscultation.  COR:  Normal S1, normal S2.  ABDOMEN:  Soft, nontender.  The patient is ticklish and involuntary  guards.  His bowel sounds were normoactive.  Liver edge at costal  margin.  Well-healed surgical scar.  No tenderness.  No mass in right  lower quadrant.  RECTAL EXAM:  Normal rectal tone.  Stool was hemoccult negative.   Last CT scan of the abdomen obtained was January 14, 2003.  It showed a  small amount of thickening in the right lower quadrant medial to  the  cecum, compatible with Crohn disease.  No evidence of fistulization.   IMPRESSION:  11. 76 year old gentleman with Crohn disease of the distal ileum,      status post 2 terminal ileal resections.  He has occasional      diarrhea, but overall doing well on immunomodulator.  2. History of iron deficiency anemia.  He has an upcoming exam by Dr.      Maylon Peppers.   PLAN:  1. 6-MP metabolite levels on 75 mg a day.  2. Cut back on the roughage.  3. Imodium p.r.n. diarrhea.  4. I will see him again in 1 year.  At this point, there are no other      tests done.  He will have the physical exam, blood tests with Dr.      Maylon Peppers.     Lowella Bandy. Olevia Perches, MD  Electronically Signed    DMB/MedQ  DD: 02/17/2007  DT: 02/17/2007  Job #: [id number]   cc:   Candace Gallus, M.D.

## 2010-07-27 NOTE — H&P (Signed)
NAME:  Ricardo Villa, Ricardo Villa NO.:  [id number]   MEDICAL RECORD NO.:  [id number]          PATIENT TYPE:  INP   LOCATION:  3028                         FACILITY:  [facility]   PHYSICIAN:  Shaune Pascal. Champey, M.D.DATE OF BIRTH:  November 28, 1934   DATE OF ADMISSION:  03/01/2005  DATE OF DISCHARGE:  03/03/2005                                HISTORY & PHYSICAL   REASON FOR CONSULTATION:  TIA/stroke.   REQUESTING PHYSICIAN:  Dr. Dorna Mai   HISTORY OF PRESENT ILLNESS:  Ricardo Villa is a 76 year old Caucasian male  with past medical history of coronary artery disease who presents with  sudden onset of left hand and finger numbness greater than left lower  extremity numbness and worsening of left lower extremity weakness. The  patient stated initially the symptoms started last Sunday where he developed  left lower greater than upper extremity weakness which has been slowly  resolving. He came to the emergency room on Tuesday. The symptoms had not  completely resolved. He was referred to Dr. Doy Mince of neurology and was  seen in the neurology office today with symptoms of left-sided weakness but  still not completely resolved. Outpatient workup was arranged. This p.m.  around 9 p.m. the patient developed new onset of left finger and hand  tingling and numbness greater in the upper extremity than lower extremity  and worsening of left lower extremity weakness that has gradually resolved  and improved and the patient states he is at the baseline of his prior  status prior to these new symptoms occurring. He denies any other  neurological symptoms such as headaches, vision changes, speech changes,  swallowing problems, chewing problems, dizziness, vertigo, or loss of  consciousness.   PAST MEDICAL HISTORY:  Positive for Crohn's disease.   CURRENT MEDICATIONS:  Include Asacol, mercaptopurine, Lomotil, aspirin, fish  oil, and glucosamine.   ALLERGIES:  The patient has no known drug  allergies.   FAMILY HISTORY:  Positive for diabetes and stroke.   SOCIAL HISTORY:  The patient currently lives with his wife. Denies any  smoking or excessive alcohol use.   REVIEW OF SYSTEMS:  Positive as per history of present illness. Review of  systems is negative as per history of present illness in greater than nine  other organ systems.   PHYSICAL EXAMINATION:  VITAL SIGNS:  Temperature is 98.0, blood pressure is  112/68, pulse is 91, respirations are 20, O2 saturation is 95% on room air.  HEENT:  Normocephalic, atraumatic. Extraocular muscles are intact. Pupils  equal, round, and reactive to light.  NECK:  Supple, no carotid bruits.  HEART:  Regular.  LUNGS:  Clear.  ABDOMEN:  Soft, nontender.  EXTREMITIES:  Show no edema with good pulses.  NEUROLOGIC:  The patient is alert and oriented x3.  Language is fluent.  Memory and knowledge are within normal limits. Cranial nerves II-XII are  grossly intact. Motor examination reveals 5/5 strength and normal tone in  all four extremities. No drift is noted. Sensory examination is within  normal limits to light touch and pinprick. Reflexes are 1+ throughout,  symmetric. Toes are downgoing bilaterally. Cerebellar  function is within  normal limits finger-to-nose and rapid alternating movements. Gait not  assessed secondary to safety.   LABORATORY DATA:  Sodium is 140, potassium is 3.7, chloride was 108, CO2 is  28, BUN 13, creatinine is 1.1, glucose 122. Hemoglobin is 13.3, hematocrit  is 39. PT is 12.1, INR is 0.9, PTT is 26. CT of the head showed no acute  bleed or infarct.   IMPRESSION:  A 76 year old who presents with transient ischemic attack of  left-sided numbness and worsening weakness which has resolved and the  patient is back to baseline with some residual left lower extremity weakness  since last Sunday. As the is being admitted in the outpatient we will admit  the patient to the stroke MD service and obtain an MRI/MRA  of the brain and  MRA of the neck. Will check lipids and homocysteine levels. Will continue  his home medications and increase his aspirin to 325 mg per day. We will  also order a 2-D echocardiogram and carotid Dopplers and get PT, OT and  speech evaluation.      Shaune Pascal. Estella Husk, M.D.  Electronically Signed     DRC/MEDQ  D:  03/02/2005  T:  03/03/2005  Job:  [id number]

## 2010-07-27 NOTE — Discharge Summary (Signed)
NAME:  Ricardo Villa, Ricardo Villa NO.:  [id number]   MEDICAL RECORD NO.:  [id number]          PATIENT TYPE:  INP   LOCATION:  3028                         FACILITY:  [facility]   PHYSICIAN:  Legrand Como L. Reynolds, M.D.DATE OF BIRTH:  1934/12/29   DATE OF ADMISSION:  03/01/2005  DATE OF DISCHARGE:  03/03/2005                                 DISCHARGE SUMMARY   ADMISSION DIAGNOSES:  1.  Transient left-sided weakness sensory changes presumed transient      ischemic attack.  2.  History of Crohn's disease.   DISCHARGE DIAGNOSES:  1.  Transient left-sided weakness sensory changes presumed small vessel      transient ischemic attack.  2.  Extensive white matter hyperdensities by imaging of uncertain etiology      presumed due to small vessel disease with usual risk factors including      hypertension, diabetes and hypercholesterolemia, rule out vasculitis,      multiple emboli, MS or other inflammatory states.  3.  Hypertriglyceridemia.  4.  Hyperhomocysteinemia.  5.  History of Crohn's disease.  6.  Abnormal chest x-ray with benign finding of fluid in the fissure on      followup chest CT.   CONDITION ON DISCHARGE:  Stable.   DIET:  Low salt, low fat, low cholesterol.   ACTIVITY:  Ad lib.   DISCHARGE MEDICATIONS:  1.  Aggrenox one p.o. b.i.d. (new).  2.  Tricor 48 mg one p.o. daily (new).  3.  MetaNx one p.o. daily (new)  4.  Asacol 40 mg b.i.d.  5.  Purinethol 55 mg daily.  6.  Lomotil daily.  7.  Fish oil 1000 mg daily.  8.  Glucose 1000 mg daily.   STUDIES:  1.  CT of the head performed March 01, 2005 demonstrating diffuse small      vessel ischemic changes in the hemispheric white matter in the pons      without definite acute infarct and along with mild atrophy.  2.  MRI of the brain performed March 02, 2005 demonstrating multiple      hyperdensities on T2 and FLARE imaging throughout the subcortical areas      of the hemispheres and in the pons without  finding of acute infarct as      well as mild atrophy.  3.  MRA of the intracranial circulation demonstrating mild diffuse      intracranial atherosclerotic disease.  4.  MRA of the extracranial circulation with contrast demonstrating      questionable narrowing of the proximal and distal left vertebral      otherwise no hemodynamically significant findings.  5.  Chest x-ray March 01, 2005 possible 2-3 cm mass in the right lower      lobe.  6.  Chest CT March 02, 2005 (preliminary reading) Pseudotumor with      fluid in the fissure of the right lung and no significant findings.  7.  Laboratory review hemoglobin and hematocrit 13.3 and 39, respectively.      Electrolytes in the emergency room remarkable only for an elevated      glucose of  122.  AST and ALT 20 and 22, respectively.  Sedimentation      rate normal at 16.  Homocystine level elevated at 17.5.  Lipid panel      total cholesterol 126, triglycerides elevated at 333.  HDL low at 26.      VLDL elevated at 67.  LDL normal at 33 although likely falsely low due      to elevated triglyceride level.  Pending labs at discharge include      antinuclear antibodies, anticardiolipin antibody panel and lupus      anticoagulant panel.  Serum RPR is nonreactive.  8.  Telemetry demonstrating no arrhythmias.   HOSPITAL COURSE:  Please see admission H&P by Dr. Estella Husk for admission  details as well as my clinic note from March 01, 2005.  Briefly, this is  a 76 year old man who five days prior to admission had an episode of mild  left hemiparesis which lasted a day or two and was worked up in the  emergency room on February 26, 2005 but not admitted.  CT scan at that time  showed extensive white matter disease.  He was seen in the office on  March 01, 2005 and an urgent outpatient workup was arranged but then he  had recurrent symptoms on the evening of March 01, 2005 and presented to  the emergency room and was subsequently  admitted.  His symptoms only lasted  a couple of hours on the evening of March 01, 2005 and subsequently  resolved and he remained asymptomatic throughout his hospitalization.  His  neurologic examination was basically normal throughout his entire stay.  He  underwent workup for cerebrovascular disease as noted above.  Of note, the  patient did not have known chronic risk factors for small vessel disease  including hypertension, diabetes, hypercholesterolemia and has fairly  extensive small vessel disease changes on his imaging studies and the  etiology of this is uncertain at the time of discharge.  We could not  arrange for 2-D echocardiogram  to be done during his hospital stay but he  did have the MRI/MRA lab studies as noted above.  On the basis of his  laboratory studies Tricor and MetaNx were added to his regimen and his  aspirin was discontinued and changed to Aggrenox.  On the morning of  March 03, 2005 he was felt stable for discharge and was released home.   DISPOSITION:  The patient was advised to call [facility]  on March 05, 2005 to followup on scheduling for an outpatient 2-D  echocardiogram to be performed at [facility] which was ordered on  that day.  He is also asked to call the office to followup with myself and  Burnetta Sabin, nurse practitioner in two weeks.  At that time we will review  his labs which remain pending for his hospitalization as well as the results  of the 2-D echocardiogram and proceed with further workup.  At a minimum he  will need a transcranial Doppler and a  bubble study and depending on findings and other clinical course he may  require angiogram to exclude vasculitis, possibly LP to rule out vasculitis  and perhaps demyelinating disease.  The patient and his wife are  understanding and agreement of the plan.      Ricardo Villa, M.D.  Electronically Signed    MLR/MEDQ  D:  03/03/2005  T:  03/05/2005   Job:  [id number]   cc:   Candace Gallus,  M.D.  Fax: [phone]   [name], M.D. [facility]  [address]  [place] [address]

## 2010-07-27 NOTE — Assessment & Plan Note (Signed)
[facility]                         GASTROENTEROLOGY OFFICE NOTE   NAME:Ricardo Villa, Ricardo Villa                   MRN:          [id number]  DATE:03/14/2006                            DOB:          11/25/1934    Ricardo Villa is a 77 year old gentleman with Crohn disease of the  ileocolic anastomosis, status post terminal ileal resection and lysis of  adhesions in 1988 and in 1995.  Last colonoscopy in June 2006 showed  active Crohn disease at the ileocolic anastomosis.  He has a history of  iron-deficiency anemia as well as B12 deficiency.  He has done extremely  well for a period of past year being on immunomodulator 6-mercaptopurine  75 mg daily as well as Asacol 400 mg twice a day.  He denies any  abdominal pain, symptoms of obstruction, rectal bleeding.  He had a  hospitalization at [facility] in December 2006 on the neurology  service of Dr. Doy Mince with questionable TIA.  Since then the patient's  medications included Plavix 75 mg a day and Tricor 48 mg daily.   PHYSICAL EXAMINATION:  VITAL SIGNS:  Blood pressure 110/68, pulse 68,  weight 176 pounds and stable.  LUNGS:  Clear to auscultation.  CARDIAC:  With normal S1 and normal S2.  ABDOMEN:  Soft with well-healed surgical scar.  Somewhat hyperactive  bowel sounds but no distention and no tenderness.  RECTAL:  Prostate 2+,  Stool Hemoccult-negative.   IMPRESSION:  A 76 year old white male with stable Crohn disease at the  ileocolic anastomosis, status post previous terminal ileal resection x2.   PLAN:  1. May discontinue Asacol since therapeutic benefits of mesalamine at      the ileocolic anastomosis are doubtful.  2. Continue 6-MP at 75 mg a day.  We will get the metabolite levels      today.  3. Iron studies as well as B12 today.  4. I will see him again in one year.     Lowella Bandy. Olevia Perches, MD  Electronically Signed    DMB/MedQ  DD: 03/14/2006  DT: 03/14/2006  Job #: [id number]   cc:    Candace Gallus, M.D.

## 2010-10-12 ENCOUNTER — Other Ambulatory Visit: Payer: Self-pay | Admitting: *Deleted

## 2010-10-12 MED ORDER — DIPHENOXYLATE-ATROPINE 2.5-0.025 MG PO TABS: 1.0000 | ORAL_TABLET | Freq: Every day | ORAL | Status: DC

## 2010-11-13 ENCOUNTER — Encounter: Payer: Self-pay | Admitting: Internal Medicine

## 2010-11-13 ENCOUNTER — Other Ambulatory Visit (INDEPENDENT_AMBULATORY_CARE_PROVIDER_SITE_OTHER): Payer: Medicare Other

## 2010-11-13 ENCOUNTER — Ambulatory Visit (INDEPENDENT_AMBULATORY_CARE_PROVIDER_SITE_OTHER): Payer: Medicare Other | Admitting: Internal Medicine

## 2010-11-13 VITALS — BP 110/64 | HR 64 | Ht 73.0 in | Wt 172.0 lb

## 2010-11-13 DIAGNOSIS — E785 Hyperlipidemia, unspecified: Secondary | ICD-10-CM

## 2010-11-13 DIAGNOSIS — Z79899 Other long term (current) drug therapy: Secondary | ICD-10-CM

## 2010-11-13 DIAGNOSIS — K5010 Crohn's disease of large intestine without complications: Secondary | ICD-10-CM

## 2010-11-13 DIAGNOSIS — D509 Iron deficiency anemia, unspecified: Secondary | ICD-10-CM

## 2010-11-13 LAB — CBC WITH DIFFERENTIAL/PLATELET
Basophils Absolute: 0 10*3/uL (ref 0.0–0.1)
Eosinophils Relative: 1.1 % (ref 0.0–5.0)
Monocytes Relative: 8 % (ref 3.0–12.0)
Neutrophils Relative %: 68.6 % (ref 43.0–77.0)
Platelets: 267 10*3/uL (ref 150.0–400.0)
WBC: 7.9 10*3/uL (ref 4.5–10.5)

## 2010-11-13 LAB — COMPREHENSIVE METABOLIC PANEL
ALT: 14 U/L (ref 0–53)
Alkaline Phosphatase: 43 U/L (ref 39–117)
Creatinine, Ser: 1.3 mg/dL (ref 0.4–1.5)
Glucose, Bld: 114 mg/dL — ABNORMAL HIGH (ref 70–99)
Sodium: 143 mEq/L (ref 135–145)
Total Bilirubin: 0.9 mg/dL (ref 0.3–1.2)
Total Protein: 5.8 g/dL — ABNORMAL LOW (ref 6.0–8.3)

## 2010-11-13 LAB — VITAMIN B12: Vitamin B-12: 531 pg/mL (ref 211–911)

## 2010-11-13 LAB — IBC PANEL
Iron: 116 ug/dL (ref 42–165)
Transferrin: 336.7 mg/dL (ref 212.0–360.0)

## 2010-11-13 LAB — SEDIMENTATION RATE: Sed Rate: 19 mm/hr (ref 0–22)

## 2010-11-13 MED ORDER — DIPHENOXYLATE-ATROPINE 2.5-0.025 MG PO TABS: 1.0000 | ORAL_TABLET | Freq: Every day | ORAL | Status: DC

## 2010-11-13 MED ORDER — MERCAPTOPURINE 50 MG PO TABS: ORAL_TABLET | ORAL | Status: DC

## 2010-11-13 NOTE — Progress Notes (Signed)
Ricardo Villa 08-08-1934 MRN [id number]     History of Present Illness:  This is a 76 year old white male with Crohn's disease of the terminal ileum who is coming for a yearly checkup. His initial diagnosis was made in 1988. He had a terminal ileum resection in 1988 and again in 1995. His last colonoscopy in July 2011 showed a prior right hemicolectomy and sessile polyp at the anastomosis which was a tubular adenoma. He needs a repeat colonoscopy in 5 years. He denies any abdominal pain. He has occasional diarrhea but less than once a month. His weight has been stable. He has been followed for prostate cancer by Dr.Borden. He has been on 6 MP 50 mg daily, iron supplements daily and B12 supplements monthly.    Past Medical History  Diagnosis Date  . Hyperhomocysteinemia   . Hyperlipidemia   . Small bowel obstruction   . History of adenomatous polyp of colon   . Vitamin B12 deficiency   . Crohn disease   . Prostate cancer    Past Surgical History  Procedure Date  . Cholecystectomy   . Terminal ileum resection   . Appendectomy     reports that he has quit smoking. He has never used smokeless tobacco. He reports that he drinks alcohol. He reports that he does not use illicit drugs. family history includes Crohn's disease in his mother; Diabetes in his paternal grandfather; and Heart disease in his father.  There is no history of Colon cancer. No Known Allergies      Review of Systems:  The remainder of the 10  point ROS is negative except as outlined in H&P   Physical Exam: General appearance  Well developed, in no distress. Eyes- non icteric. HEENT nontraumatic, normocephalic. Mouth no lesions, tongue papillated, no cheilosis. Neck supple without adenopathy, thyroid not enlarged, no carotid bruits, no JVD. Lungs Clear to auscultation bilaterally. Cor normal S1 normal S2, regular rhythm , no murmur,  quiet precordium. Abdomen soft nontender abdomen with voluntary  guarding. Hyperactive bowel sounds. Liver edge at costal margin. Well-healed surgical scars. Rectal: Extremities no pedal edema. Skin no lesions, Dupuytren contractures right hand. Neurological alert and oriented x 3. Psychological normal mood and affect.  Assessment and Plan:  Problem #1 Crohn's disease of the terminal ileum, status post a right hemicolectomy. He is doing well. There have been no episodes of small bowel obstruction. He has chronic iron deficiency anemia which needs to be followed closely. He is also on 6-MP indefinitely. He will remain on the same dose.  We will repeat his labs today.   11/13/2010 Ricardo Villa

## 2010-11-13 NOTE — Patient Instructions (Signed)
Your physician has requested that you go to the basement for the following lab work before leaving today: CBC, IBC, B12 level, CMET, Sedimentation Rate, TSH We have sent the following medications to your pharmacy for you to pick up at your convenience: 6 MP #60 with refills Lomotil #30 with 11 refills CC: Dr Edwin Dada

## 2010-11-14 ENCOUNTER — Telehealth: Payer: Self-pay | Admitting: *Deleted

## 2010-11-14 NOTE — Telephone Encounter (Signed)
Message copied by Hulan Saas on Wed Nov 14, 2010  9:03 AM ------      Message from: Lafayette Dragon      Created: Tue Nov 13, 2010  5:55 PM       Please call pt with all labs normal, continue Iron and B12.

## 2010-11-14 NOTE — Telephone Encounter (Signed)
Spoke with patient's wife and gave her the results and Dr. Nichola Sizer recommendations

## 2011-09-07 ENCOUNTER — Other Ambulatory Visit: Payer: Self-pay | Admitting: Internal Medicine

## 2011-09-09 ENCOUNTER — Other Ambulatory Visit: Payer: Self-pay | Admitting: Internal Medicine

## 2011-09-09 MED ORDER — MERCAPTOPURINE 50 MG PO TABS: ORAL_TABLET | ORAL | Status: DC

## 2011-09-09 NOTE — Telephone Encounter (Signed)
rx sent

## 2011-10-19 ENCOUNTER — Other Ambulatory Visit: Payer: Self-pay | Admitting: Internal Medicine

## 2011-10-22 ENCOUNTER — Other Ambulatory Visit (INDEPENDENT_AMBULATORY_CARE_PROVIDER_SITE_OTHER): Payer: Medicare Other

## 2011-10-22 ENCOUNTER — Encounter: Payer: Self-pay | Admitting: Internal Medicine

## 2011-10-22 ENCOUNTER — Ambulatory Visit (INDEPENDENT_AMBULATORY_CARE_PROVIDER_SITE_OTHER): Payer: Medicare Other | Admitting: Internal Medicine

## 2011-10-22 VITALS — BP 120/60 | HR 80 | Ht 71.0 in | Wt 167.1 lb

## 2011-10-22 DIAGNOSIS — K5090 Crohn's disease, unspecified, without complications: Secondary | ICD-10-CM

## 2011-10-22 DIAGNOSIS — Z79899 Other long term (current) drug therapy: Secondary | ICD-10-CM

## 2011-10-22 DIAGNOSIS — D849 Immunodeficiency, unspecified: Secondary | ICD-10-CM

## 2011-10-22 LAB — IBC PANEL
Iron: 126 ug/dL (ref 42–165)
Saturation Ratios: 27.3 % (ref 20.0–50.0)
Transferrin: 330.2 mg/dL (ref 212.0–360.0)

## 2011-10-22 LAB — CBC WITH DIFFERENTIAL/PLATELET
Basophils Relative: 0.4 % (ref 0.0–3.0)
Eosinophils Absolute: 0.1 10*3/uL (ref 0.0–0.7)
Eosinophils Relative: 1.7 % (ref 0.0–5.0)
Hemoglobin: 12.5 g/dL — ABNORMAL LOW (ref 13.0–17.0)
Lymphocytes Relative: 18.6 % (ref 12.0–46.0)
Monocytes Relative: 8.4 % (ref 3.0–12.0)
Neutro Abs: 5.2 10*3/uL (ref 1.4–7.7)
Neutrophils Relative %: 70.9 % (ref 43.0–77.0)
RBC: 3.95 Mil/uL — ABNORMAL LOW (ref 4.22–5.81)
WBC: 7.4 10*3/uL (ref 4.5–10.5)

## 2011-10-22 LAB — COMPREHENSIVE METABOLIC PANEL
ALT: 13 U/L (ref 0–53)
AST: 22 U/L (ref 0–37)
Albumin: 3.5 g/dL (ref 3.5–5.2)
Alkaline Phosphatase: 42 U/L (ref 39–117)
BUN: 16 mg/dL (ref 6–23)
Potassium: 3.9 mEq/L (ref 3.5–5.1)
Sodium: 141 mEq/L (ref 135–145)

## 2011-10-22 MED ORDER — DIPHENOXYLATE-ATROPINE 2.5-0.025 MG PO TABS: 1.0000 | ORAL_TABLET | Freq: Every day | ORAL | Status: DC

## 2011-10-22 NOTE — Patient Instructions (Addendum)
Your physician has requested that you go to the basement for the following lab work before leaving today: CBC, CMET, Sed Rate, IBC Panel, B12 We have sent the following medications to your pharmacy for you to pick up at your convenience: Lomotil Please follow up with Dr Olevia Perches in 1 year. CC: Dr Leighton Ruff

## 2011-10-22 NOTE — Progress Notes (Signed)
Ricardo Villa [birth year] MRN [id number]        History of Present Illness:  This is a 77 year old, white male with the Crohn's disease since 77. Terminal ileum resection in 1988 and again in 1995. He has chronic iron deficiency and B12 deficiency. Last colonoscopy in July 2011 showed sessile polyp, which was a tubular adenoma. He has a history of partial small bowel obstruction. Last appointment September 2012. His  hemoglobin was 12 point 7, hematocrit 38 point 3, B12 531 and iron saturation of 24%. He has been on 6 MP, iron and B12 supplements. There is a history of cholecystectomy. He has a history of prostate cancer. He has no complaints today  Past Medical History  Diagnosis Date  . Hyperhomocysteinemia   . Hyperlipidemia   . Small bowel obstruction   . History of adenomatous polyp of colon   . Vitamin B12 deficiency   . Crohn disease   . Prostate cancer    Past Surgical History  Procedure Date  . Cholecystectomy   . Terminal ileum resection   . Appendectomy     reports that he has quit smoking. He has never used smokeless tobacco. He reports that he drinks alcohol. He reports that he does not use illicit drugs. family history includes Crohn's disease in his mother; Diabetes in his paternal grandfather; and Heart disease in his father.  There is no history of Colon cancer. No Known Allergies      Review of Systems: Negative for abdominal pain. Positive for occasional diarrhea  The remainder of the 10 point ROS is negative except as outlined in H&P   Physical Exam: General appearance  Well developed, in no distress. Eyes- non icteric. HEENT nontraumatic, normocephalic. Mouth no lesions, tongue papillated, no cheilosis. Neck supple without adenopathy, thyroid not enlarged, no carotid bruits, no JVD. Lungs Clear to auscultation bilaterally. Cor normal S1, normal S2, regular rhythm, no murmur,  quiet precordium. Abdomen: Soft, nontender with normal active bowel  sounds. No distention. Well-healed surgical scars. No palpable mass Rectal: Normal rectal sphincter tone. Dark Hemoccult-positive stool Extremities no pedal edema. Skin no lesions. Neurological alert and oriented x 3. Psychological normal mood and affect.  Assessment and Plan:  Asymptomatic. Crohn's disease with prior terminal ileum resection. He is again Hemoccult positive. And I suspect it originates at the ileocolic anastomosis. He will continue supplement. his iron and B12. We are checking his metabolic panel, blood count, B12 and iron levels. He needs a refill on his Lomotil. His recall colonoscopy. Will be due in 2016   10/22/2011 [name]

## 2011-10-28 ENCOUNTER — Telehealth: Payer: Self-pay | Admitting: Internal Medicine

## 2011-10-28 NOTE — Telephone Encounter (Signed)
Unable to reach patient on home phone. Left a message on cell number.

## 2011-10-28 NOTE — Telephone Encounter (Signed)
Spoke with patient and gave him labs results and recommendations,

## 2012-01-04 ENCOUNTER — Other Ambulatory Visit: Payer: Self-pay | Admitting: Internal Medicine

## 2012-04-18 ENCOUNTER — Other Ambulatory Visit: Payer: Self-pay | Admitting: Internal Medicine

## 2012-07-10 ENCOUNTER — Other Ambulatory Visit: Payer: Self-pay | Admitting: Internal Medicine

## 2012-07-10 DIAGNOSIS — K5090 Crohn's disease, unspecified, without complications: Secondary | ICD-10-CM

## 2012-07-10 NOTE — Telephone Encounter (Signed)
Patient to come for labs next week. He states he will do this. Advised him we will send 57m after his labs are complete. Also advised that he will need an office visit in August 2014.

## 2012-07-10 NOTE — Telephone Encounter (Signed)
Message copied by Larina Bras on Fri Jul 10, 2012 12:39 PM ------      Message from: Lafayette Dragon      Created: Fri Jul 10, 2012 12:24 PM       Please refill 6MP and obtain CBC, Iron, B12 levels. 33MO 55m, 2 po qd, #60  X 1 year      ----- Message -----         From: DLarina Bras CMA         Sent: 07/10/2012   9:20 AM           To: DLafayette Dragon MD            Dr BOlevia Perches      Patient requests refills on 6MP. However, [place] [facility]. He is due for follow up office visit this August. Should I have him come for labs/appointment before refilling med since he has apparently been off of the 6MP for quite sometime?       ------

## 2012-07-17 ENCOUNTER — Other Ambulatory Visit (INDEPENDENT_AMBULATORY_CARE_PROVIDER_SITE_OTHER): Payer: Medicare Other

## 2012-07-17 DIAGNOSIS — K5090 Crohn's disease, unspecified, without complications: Secondary | ICD-10-CM

## 2012-07-17 LAB — CBC WITH DIFFERENTIAL/PLATELET
Basophils Absolute: 0 K/uL (ref 0.0–0.1)
Basophils Relative: 0.3 % (ref 0.0–3.0)
Eosinophils Absolute: 0.1 K/uL (ref 0.0–0.7)
Eosinophils Relative: 1.4 % (ref 0.0–5.0)
HCT: 37.5 % — ABNORMAL LOW (ref 39.0–52.0)
Hemoglobin: 12.6 g/dL — ABNORMAL LOW (ref 13.0–17.0)
Lymphocytes Relative: 21.5 % (ref 12.0–46.0)
Lymphs Abs: 1.6 K/uL (ref 0.7–4.0)
MCHC: 33.7 g/dL (ref 30.0–36.0)
MCV: 95.3 fl (ref 78.0–100.0)
Monocytes Absolute: 0.7 K/uL (ref 0.1–1.0)
Monocytes Relative: 9.7 % (ref 3.0–12.0)
Neutro Abs: 5 K/uL (ref 1.4–7.7)
Neutrophils Relative %: 67.1 % (ref 43.0–77.0)
Platelets: 215 K/uL (ref 150.0–400.0)
RBC: 3.93 Mil/uL — ABNORMAL LOW (ref 4.22–5.81)
RDW: 15.4 % — ABNORMAL HIGH (ref 11.5–14.6)
WBC: 7.4 K/uL (ref 4.5–10.5)

## 2012-07-17 LAB — IBC PANEL
Iron: 264 ug/dL — ABNORMAL HIGH (ref 42–165)
Saturation Ratios: 56.2 % — ABNORMAL HIGH (ref 20.0–50.0)
Transferrin: 335.7 mg/dL (ref 212.0–360.0)

## 2012-07-20 ENCOUNTER — Other Ambulatory Visit: Payer: Self-pay | Admitting: *Deleted

## 2012-07-20 DIAGNOSIS — D508 Other iron deficiency anemias: Secondary | ICD-10-CM

## 2012-07-21 ENCOUNTER — Other Ambulatory Visit: Payer: Self-pay | Admitting: Internal Medicine

## 2012-09-17 ENCOUNTER — Other Ambulatory Visit: Payer: Self-pay | Admitting: Internal Medicine

## 2012-10-17 ENCOUNTER — Other Ambulatory Visit: Payer: Self-pay | Admitting: Internal Medicine

## 2012-10-27 ENCOUNTER — Encounter: Payer: Self-pay | Admitting: Internal Medicine

## 2012-10-27 ENCOUNTER — Ambulatory Visit (INDEPENDENT_AMBULATORY_CARE_PROVIDER_SITE_OTHER): Payer: Medicare Other | Admitting: Internal Medicine

## 2012-10-27 VITALS — BP 114/76 | HR 80 | Ht 71.0 in | Wt 168.8 lb

## 2012-10-27 DIAGNOSIS — K5000 Crohn's disease of small intestine without complications: Secondary | ICD-10-CM

## 2012-10-27 DIAGNOSIS — R195 Other fecal abnormalities: Secondary | ICD-10-CM

## 2012-10-27 DIAGNOSIS — D509 Iron deficiency anemia, unspecified: Secondary | ICD-10-CM

## 2012-10-27 MED ORDER — MERCAPTOPURINE 50 MG PO TABS: ORAL_TABLET | ORAL | Status: DC

## 2012-10-27 MED ORDER — DIPHENOXYLATE-ATROPINE 2.5-0.025 MG PO TABS: ORAL_TABLET | ORAL | Status: DC

## 2012-10-27 NOTE — Progress Notes (Signed)
Ricardo Villa 28-Jun-1934 MRN [id number]   History of Present Illness:  This is a 78 year old white male with Crohn's disease of the terminal ileum since 1988. He had a terminal ileal resection in 1988 and again in 1995. He has chronic iron deficiency anemia and B12 deficiency. His last colonoscopy in July 2011 showed a sessile polyp which was a tubular adenoma. He has been under good control with 6 MP 50 mg daily. There is a history of a laparoscopic cholecystectomy and prostate cancer. He has a history of recurrent small bowel obstructions but has had no episodes in the last year. He takes one Lomotil once a day. His weight has been stable at 168 pounds. He is up-to-date on his flu vaccine (12/03/11) as well as pneumococcal vaccine (02/08/03). Both of these vaccines were completed with Dr Leighton Ruff.   Past Medical History  Diagnosis Date  . Hyperhomocysteinemia   . Hyperlipidemia   . Small bowel obstruction   . History of adenomatous polyp of colon   . Vitamin B12 deficiency   . Crohn disease   . Prostate cancer    Past Surgical History  Procedure Laterality Date  . Cholecystectomy    . Terminal ileum resection    . Appendectomy      reports that he has quit smoking. He has never used smokeless tobacco. He reports that  drinks alcohol. He reports that he does not use illicit drugs. family history includes Crohn's disease in his mother; Diabetes in his paternal grandfather; Heart disease in his father. There is no history of Colon cancer. No Known Allergies      Review of Systems: Occasional diarrhea. Denies rectal bleeding  The remainder of the 10 point ROS is negative except as outlined in H&P   Physical Exam: General appearance  Well developed, in no distress. Eyes- non icteric. HEENT nontraumatic, normocephalic. Mouth no lesions, tongue papillated, no cheilosis. Neck supple without adenopathy, thyroid not enlarged, no carotid bruits, no JVD. Lungs Clear to  auscultation bilaterally. Cor normal S1, normal S2, regular rhythm, no murmur,  quiet precordium. Abdomen: Soft abdomen. Nontender. Increased tympany. Normoactive bowel sounds. No palpable mass, post cholecystectomy scar in right upper quadrant. Rectal: Soft Hemoccult-positive stool. Extremities no pedal edema. Skin no lesions., Dupuytren's' contractures Neurological alert and oriented x 3. Psychological normal mood and affect.  Assessment and Plan:  Problem #72 78 year old white male with stable Crohn's ileitis post 2 resections. He has had no recent small bowel obstructions. His iron levels as well as B12 levels were normal as of May 2014, he is always heme positive likely from an anastomotic inflammation. He is up-to-date on his colonoscopy. We will refill his 6-MP as well as his Lomotil. I will see him in one year.   10/27/2012 [name]

## 2012-10-27 NOTE — Patient Instructions (Addendum)
We have sent the following medications to your pharmacy for you to pick up at your convenience: 47m Lomotil  CC: Dr ELeighton Ruff

## 2012-11-14 ENCOUNTER — Other Ambulatory Visit: Payer: Self-pay | Admitting: Neurology

## 2012-12-09 ENCOUNTER — Emergency Department (HOSPITAL_BASED_OUTPATIENT_CLINIC_OR_DEPARTMENT_OTHER): Payer: Medicare Other

## 2012-12-09 ENCOUNTER — Inpatient Hospital Stay (HOSPITAL_BASED_OUTPATIENT_CLINIC_OR_DEPARTMENT_OTHER)
Admission: EM | Admit: 2012-12-09 | Discharge: 2012-12-16 | DRG: 871 | Disposition: A | Discharge status: Home / Self Care | Payer: Medicare Other | Attending: Internal Medicine | Admitting: Internal Medicine

## 2012-12-09 ENCOUNTER — Encounter (HOSPITAL_BASED_OUTPATIENT_CLINIC_OR_DEPARTMENT_OTHER): Payer: Self-pay | Admitting: Emergency Medicine

## 2012-12-09 DIAGNOSIS — J150 Pneumonia due to Klebsiella pneumoniae: Secondary | ICD-10-CM | POA: Diagnosis not present

## 2012-12-09 DIAGNOSIS — K5090 Crohn's disease, unspecified, without complications: Secondary | ICD-10-CM | POA: Diagnosis present

## 2012-12-09 DIAGNOSIS — Z79899 Other long term (current) drug therapy: Secondary | ICD-10-CM

## 2012-12-09 DIAGNOSIS — D696 Thrombocytopenia, unspecified: Secondary | ICD-10-CM | POA: Diagnosis not present

## 2012-12-09 DIAGNOSIS — R571 Hypovolemic shock: Secondary | ICD-10-CM

## 2012-12-09 DIAGNOSIS — D649 Anemia, unspecified: Secondary | ICD-10-CM | POA: Diagnosis not present

## 2012-12-09 DIAGNOSIS — E860 Dehydration: Secondary | ICD-10-CM | POA: Diagnosis present

## 2012-12-09 DIAGNOSIS — E876 Hypokalemia: Secondary | ICD-10-CM | POA: Diagnosis present

## 2012-12-09 DIAGNOSIS — K5669 Other intestinal obstruction: Secondary | ICD-10-CM | POA: Diagnosis present

## 2012-12-09 DIAGNOSIS — E872 Acidosis: Secondary | ICD-10-CM

## 2012-12-09 DIAGNOSIS — N19 Unspecified kidney failure: Secondary | ICD-10-CM

## 2012-12-09 DIAGNOSIS — Z8546 Personal history of malignant neoplasm of prostate: Secondary | ICD-10-CM

## 2012-12-09 DIAGNOSIS — N179 Acute kidney failure, unspecified: Secondary | ICD-10-CM | POA: Diagnosis present

## 2012-12-09 DIAGNOSIS — E785 Hyperlipidemia, unspecified: Secondary | ICD-10-CM | POA: Diagnosis present

## 2012-12-09 DIAGNOSIS — A419 Sepsis, unspecified organism: Principal | ICD-10-CM | POA: Diagnosis present

## 2012-12-09 DIAGNOSIS — K5000 Crohn's disease of small intestine without complications: Secondary | ICD-10-CM | POA: Diagnosis present

## 2012-12-09 DIAGNOSIS — J690 Pneumonitis due to inhalation of food and vomit: Secondary | ICD-10-CM | POA: Diagnosis not present

## 2012-12-09 DIAGNOSIS — Z87891 Personal history of nicotine dependence: Secondary | ICD-10-CM

## 2012-12-09 DIAGNOSIS — K56609 Unspecified intestinal obstruction, unspecified as to partial versus complete obstruction: Secondary | ICD-10-CM

## 2012-12-09 DIAGNOSIS — Z7982 Long term (current) use of aspirin: Secondary | ICD-10-CM

## 2012-12-09 DIAGNOSIS — N390 Urinary tract infection, site not specified: Secondary | ICD-10-CM

## 2012-12-09 LAB — CBC WITH DIFFERENTIAL/PLATELET
Eosinophils Relative: 0 % (ref 0–5)
HCT: 45.8 % (ref 39.0–52.0)
Lymphocytes Relative: 18 % (ref 12–46)
Lymphs Abs: 0.9 10*3/uL (ref 0.7–4.0)
MCV: 93.7 fL (ref 78.0–100.0)
Monocytes Absolute: 0.1 10*3/uL (ref 0.1–1.0)
RBC: 4.89 MIL/uL (ref 4.22–5.81)
WBC: 4.8 10*3/uL (ref 4.0–10.5)

## 2012-12-09 LAB — COMPREHENSIVE METABOLIC PANEL
ALT: 31 U/L (ref 0–53)
AST: 52 U/L — ABNORMAL HIGH (ref 0–37)
Alkaline Phosphatase: 60 U/L (ref 39–117)
CO2: 18 mEq/L — ABNORMAL LOW (ref 19–32)
Chloride: 105 mEq/L (ref 96–112)
GFR calc non Af Amer: 20 mL/min — ABNORMAL LOW (ref >90)
Potassium: 3.3 mEq/L — ABNORMAL LOW (ref 3.5–5.1)
Sodium: 147 mEq/L — ABNORMAL HIGH (ref 135–145)
Total Bilirubin: 2.2 mg/dL — ABNORMAL HIGH (ref 0.3–1.2)

## 2012-12-09 MED ORDER — SODIUM CHLORIDE 0.9 % IV BOLUS (SEPSIS)
1000.0000 mL | Freq: Once | INTRAVENOUS | Status: AC
  Administered 2012-12-09: 1000 mL via INTRAVENOUS

## 2012-12-09 MED ORDER — ONDANSETRON HCL 4 MG/2ML IJ SOLN
4.0000 mg | Freq: Once | INTRAMUSCULAR | Status: AC
  Administered 2012-12-09: 4 mg via INTRAVENOUS
  Filled 2012-12-09: qty 2

## 2012-12-09 NOTE — ED Notes (Signed)
Pt with vomiting since this am

## 2012-12-09 NOTE — ED Notes (Signed)
LAC drawn and hand delivered to Dr. Dina Rich. Results 7.69

## 2012-12-09 NOTE — ED Notes (Signed)
MD at bedside. 

## 2012-12-09 NOTE — ED Notes (Addendum)
Wife states she gave pt a phenergan suppository PTA-pt drowsy-easily aroused-sats 93%RA-placed on O2 2LNC-sats increased to 96% immediately after

## 2012-12-10 ENCOUNTER — Inpatient Hospital Stay (HOSPITAL_COMMUNITY): Payer: Medicare Other

## 2012-12-10 ENCOUNTER — Encounter (HOSPITAL_COMMUNITY): Payer: Self-pay | Admitting: General Surgery

## 2012-12-10 ENCOUNTER — Telehealth: Payer: Self-pay | Admitting: Internal Medicine

## 2012-12-10 DIAGNOSIS — A419 Sepsis, unspecified organism: Secondary | ICD-10-CM

## 2012-12-10 DIAGNOSIS — K56609 Unspecified intestinal obstruction, unspecified as to partial versus complete obstruction: Secondary | ICD-10-CM

## 2012-12-10 DIAGNOSIS — N179 Acute kidney failure, unspecified: Secondary | ICD-10-CM

## 2012-12-10 DIAGNOSIS — R6521 Severe sepsis with septic shock: Secondary | ICD-10-CM

## 2012-12-10 DIAGNOSIS — E872 Acidosis: Secondary | ICD-10-CM

## 2012-12-10 LAB — TYPE AND SCREEN
ABO/RH(D): A POS
Antibody Screen: NEGATIVE

## 2012-12-10 LAB — BASIC METABOLIC PANEL
BUN: 32 mg/dL — ABNORMAL HIGH (ref 6–23)
CO2: 17 mEq/L — ABNORMAL LOW (ref 19–32)
Chloride: 113 mEq/L — ABNORMAL HIGH (ref 96–112)
Creatinine, Ser: 2.32 mg/dL — ABNORMAL HIGH (ref 0.50–1.35)
GFR calc Af Amer: 30 mL/min — ABNORMAL LOW (ref >90)
GFR calc non Af Amer: 26 mL/min — ABNORMAL LOW (ref >90)
Glucose, Bld: 105 mg/dL — ABNORMAL HIGH (ref 70–99)
Potassium: 4 mEq/L (ref 3.5–5.1)

## 2012-12-10 LAB — URINE MICROSCOPIC-ADD ON

## 2012-12-10 LAB — GLUCOSE, CAPILLARY: Glucose-Capillary: 91 mg/dL (ref 70–99)

## 2012-12-10 LAB — MRSA PCR SCREENING: MRSA by PCR: NEGATIVE

## 2012-12-10 LAB — ABO/RH: ABO/RH(D): A POS

## 2012-12-10 LAB — URINALYSIS, ROUTINE W REFLEX MICROSCOPIC
Hgb urine dipstick: NEGATIVE
Ketones, ur: 15 mg/dL — AB
Protein, ur: NEGATIVE mg/dL
Urobilinogen, UA: 1 mg/dL (ref 0.0–1.0)

## 2012-12-10 LAB — CORTISOL: Cortisol, Plasma: 53.8 ug/dL

## 2012-12-10 LAB — TROPONIN I: Troponin I: <0.3 ng/mL (ref <0.30)

## 2012-12-10 LAB — CREATININE, SERUM
GFR calc Af Amer: 23 mL/min — ABNORMAL LOW (ref >90)
GFR calc non Af Amer: 20 mL/min — ABNORMAL LOW (ref >90)

## 2012-12-10 MED ORDER — IOHEXOL 300 MG/ML  SOLN
50.0000 mL | Freq: Once | INTRAMUSCULAR | Status: AC | PRN
  Administered 2012-12-10: 50 mL via ORAL

## 2012-12-10 MED ORDER — ONDANSETRON HCL 4 MG/2ML IJ SOLN
4.0000 mg | Freq: Four times a day (QID) | INTRAMUSCULAR | Status: DC | PRN
  Administered 2012-12-10: 4 mg via INTRAVENOUS

## 2012-12-10 MED ORDER — ONDANSETRON HCL 4 MG/2ML IJ SOLN
INTRAMUSCULAR | Status: AC
  Administered 2012-12-10: 4 mg via INTRAVENOUS
  Filled 2012-12-10: qty 2

## 2012-12-10 MED ORDER — HEPARIN SODIUM (PORCINE) 5000 UNIT/ML IJ SOLN
5000.0000 [IU] | Freq: Three times a day (TID) | INTRAMUSCULAR | Status: DC
  Administered 2012-12-10 – 2012-12-11 (×4): 5000 [IU] via SUBCUTANEOUS
  Filled 2012-12-10 (×7): qty 1

## 2012-12-10 MED ORDER — PIPERACILLIN-TAZOBACTAM 3.375 G IVPB
3.3750 g | Freq: Three times a day (TID) | INTRAVENOUS | Status: DC
  Administered 2012-12-10 – 2012-12-15 (×15): 3.375 g via INTRAVENOUS
  Filled 2012-12-10 (×17): qty 50

## 2012-12-10 MED ORDER — NOREPINEPHRINE BITARTRATE 1 MG/ML IJ SOLN
2.0000 ug/min | Freq: Once | INTRAVENOUS | Status: AC
  Administered 2012-12-10: 2 ug/min via INTRAVENOUS
  Filled 2012-12-10: qty 4

## 2012-12-10 MED ORDER — SODIUM CHLORIDE 0.9 % IV SOLN
Freq: Once | INTRAVENOUS | Status: AC
  Administered 2012-12-10: 01:00:00 via INTRAVENOUS

## 2012-12-10 MED ORDER — ONDANSETRON HCL 4 MG/2ML IJ SOLN
4.0000 mg | Freq: Once | INTRAMUSCULAR | Status: AC
  Administered 2012-12-10: 4 mg via INTRAVENOUS

## 2012-12-10 MED ORDER — SODIUM CHLORIDE 0.9 % IV SOLN
Freq: Once | INTRAVENOUS | Status: AC
  Administered 2012-12-10: 1000 mL via INTRAVENOUS

## 2012-12-10 MED ORDER — PANTOPRAZOLE SODIUM 40 MG IV SOLR
40.0000 mg | Freq: Every day | INTRAVENOUS | Status: DC
  Filled 2012-12-10: qty 40

## 2012-12-10 MED ORDER — VANCOMYCIN HCL IN DEXTROSE 750-5 MG/150ML-% IV SOLN
750.0000 mg | INTRAVENOUS | Status: DC
  Filled 2012-12-10: qty 150

## 2012-12-10 MED ORDER — SODIUM CHLORIDE 0.9 % IV BOLUS (SEPSIS)
1000.0000 mL | INTRAVENOUS | Status: DC | PRN
  Administered 2012-12-10: 1000 mL via INTRAVENOUS

## 2012-12-10 MED ORDER — SODIUM CHLORIDE 0.9 % IV SOLN
INTRAVENOUS | Status: DC
  Administered 2012-12-10: 05:00:00 via INTRAVENOUS

## 2012-12-10 MED ORDER — PIPERACILLIN-TAZOBACTAM IN DEX 2-0.25 GM/50ML IV SOLN
2.2500 g | Freq: Four times a day (QID) | INTRAVENOUS | Status: DC
  Administered 2012-12-10 (×2): 2.25 g via INTRAVENOUS
  Filled 2012-12-10 (×4): qty 50

## 2012-12-10 MED ORDER — SODIUM CHLORIDE 0.9 % IV BOLUS (SEPSIS): 1000.0000 mL | INTRAVENOUS | Status: DC | PRN

## 2012-12-10 MED ORDER — NOREPINEPHRINE BITARTRATE 1 MG/ML IJ SOLN
2.0000 ug/min | INTRAVENOUS | Status: DC
  Filled 2012-12-10: qty 4

## 2012-12-10 MED ORDER — DEXTROSE 5 % IV SOLN
5.0000 ug/min | INTRAVENOUS | Status: DC
  Administered 2012-12-10: 3 ug/min via INTRAVENOUS
  Administered 2012-12-10: 7 ug/min via INTRAVENOUS
  Administered 2012-12-10: 4 ug/min via INTRAVENOUS
  Administered 2012-12-10: 5 ug/min via INTRAVENOUS
  Filled 2012-12-10 (×3): qty 4

## 2012-12-10 MED ORDER — SODIUM CHLORIDE 0.9 % IV SOLN: 250.0000 mL | INTRAVENOUS | Status: DC | PRN

## 2012-12-10 MED ORDER — ACETAMINOPHEN 160 MG/5ML PO SOLN
650.0000 mg | Freq: Four times a day (QID) | ORAL | Status: DC | PRN
  Administered 2012-12-10 – 2012-12-14 (×3): 650 mg
  Filled 2012-12-10 (×3): qty 20.3

## 2012-12-10 MED ORDER — VANCOMYCIN HCL 10 G IV SOLR
1250.0000 mg | Freq: Once | INTRAVENOUS | Status: AC
  Administered 2012-12-10: 1250 mg via INTRAVENOUS
  Filled 2012-12-10: qty 1250

## 2012-12-10 MED ORDER — DEXTROSE-NACL 5-0.45 % IV SOLN
INTRAVENOUS | Status: DC
  Administered 2012-12-10 – 2012-12-12 (×2): via INTRAVENOUS
  Administered 2012-12-12: 75 mL/h via INTRAVENOUS

## 2012-12-10 MED ORDER — PANTOPRAZOLE SODIUM 40 MG IV SOLR
40.0000 mg | Freq: Every day | INTRAVENOUS | Status: DC
  Administered 2012-12-10 – 2012-12-15 (×6): 40 mg via INTRAVENOUS
  Filled 2012-12-10 (×8): qty 40

## 2012-12-10 NOTE — Care Management Note (Signed)
    Page 1 of 1   12/11/2012     1:52:21 PM   CARE MANAGEMENT NOTE 12/11/2012  Patient:  Ricardo Villa, Ricardo Villa   Account Number:  [id number]  Date Initiated:  12/10/2012  Documentation initiated by:  [facility]  Subjective/Objective Assessment:   Admitted with sepsis.     Action/Plan:   Anticipated DC Date:  12/17/2012   Anticipated DC Plan:  [place]  CM consult      Choice offered to / List presented to:             Status of service:  In process, will continue to follow Medicare Important Message given?   (If response is "NO", the following Medicare IM given date fields will be blank) Date Medicare IM given:   Date Additional Medicare IM given:    Discharge Disposition:    Per UR Regulation:  Reviewed for med. necessity/level of care/duration of stay  If discussed at [place] of Stay Meetings, dates discussed:    Comments:  Contact:  Wife Loyal Gambler   [phone]  12-11-12 1:45pm Luz Lex, RNBSN [phone] Independent at home prior to admission.  Lives with wife Guerry Minors.

## 2012-12-10 NOTE — Procedures (Signed)
Central Venous Catheter Insertion Procedure Note Ricardo Villa [id number] 10/23/1934  Procedure: Insertion of Central Venous Catheter Indications: Assessment of intravascular volume and Drug and/or fluid administration  Procedure Details Consent: Risks of procedure as well as the alternatives and risks of each were explained to the (patient/caregiver).  Consent for procedure obtained. Time Out: Verified patient identification, verified procedure, site/side was marked, verified correct patient position, special equipment/implants available, medications/allergies/relevent history reviewed, required imaging and test results available.  Performed  Maximum sterile technique was used including antiseptics, cap, gloves, gown, hand hygiene, mask and sheet. Skin prep: Chlorhexidine; local anesthetic administered A antimicrobial bonded/coated triple lumen catheter was placed in the left internal jugular vein using the Seldinger technique.  Ultrasound was used to verify the patency of the vein and for real time needle guidance.  Evaluation Blood flow good Complications: No apparent complications Patient did tolerate procedure well. Chest X-ray ordered to verify placement.  CXR: pending.  MCQUAID, DOUGLAS 12/10/2012, 5:09 AM

## 2012-12-10 NOTE — Progress Notes (Signed)
Positive blood culture results called to Dr Oliver Pila, aerobic bottle gram positive cocci in clusters.

## 2012-12-10 NOTE — Progress Notes (Addendum)
78yo male c/o vomiting since am, CT reveals SBO w/ transition point near prior abdominal surgery, to begin IV ABX.  Will start Zosyn 2.25g IV Q6H for CrCl ~20 ml/min and monitor CBC, Cx, SCr.  Wynona Neat, PharmD, BCPS 12/10/2012 4:13 AM   Addendum: Renal function is improving, SCr down to 2.32, CrCl ~29 ml/min. Cultures pending, WBC wnl, afeb  Increase Zosyn to 3.375 g IV q8h (infuse over 4 hrs)   Renold Genta, Pharm.D., BCPS Clinical Pharmacist Pager: [phone] 12/10/2012 1:44 PM

## 2012-12-10 NOTE — ED Provider Notes (Addendum)
CSN: [id number]     Arrival date & time 12/09/12  2130 History   First MD Initiated Contact with Patient 12/09/12 2147     Chief Complaint  Patient presents with  . Emesis   (Consider location/radiation/quality/duration/timing/severity/associated sxs/prior Treatment) HPI  This is a 78 rolled male who presents with vomiting. Patient has a history of Crohn's disease and colon cancer as well as small bowel obstruction. Patient reports onset of vomiting this morning.  He's had abdominal pain that comes and goes. He describes nonbilious, nonbloody emesis. He denies any diarrhea. He denies any bloody stools. He reports vomiting more than 10 times today. He endorses chills without fever. Patient reports decreased urine output. Patient states that he has had episodes like this in the past. He denies any headache, chest pain, shortness of breath, and focal weakness or numbness.  Past Medical History  Diagnosis Date  . Hyperhomocysteinemia   . Hyperlipidemia   . Small bowel obstruction   . History of adenomatous polyp of colon   . Vitamin B12 deficiency   . Crohn disease   . Prostate cancer    Past Surgical History  Procedure Laterality Date  . Cholecystectomy    . Terminal ileum resection    . Appendectomy     Family History  Problem Relation Age of Onset  . Crohn's disease Mother   . Heart disease Father   . Diabetes Paternal Grandfather   . Colon cancer Neg Hx    History  Substance Use Topics  . Smoking status: Former Research scientist (life sciences)  . Smokeless tobacco: Never Used  . Alcohol Use: Yes    Review of Systems  Constitutional: Positive for chills. Negative for fever.  Respiratory: Negative.  Negative for chest tightness and shortness of breath.   Cardiovascular: Negative.  Negative for chest pain.  Gastrointestinal: Positive for nausea, vomiting and abdominal pain. Negative for diarrhea and blood in stool.  Genitourinary: Negative for dysuria.       Decreased urinary output   Musculoskeletal: Negative for back pain.  Skin: Negative for rash.  Neurological: Negative for headaches.  All other systems reviewed and are negative.    Allergies  Review of patient's allergies indicates no known allergies.  Home Medications   Current Outpatient Rx  Name  Route  Sig  Dispense  Refill  . diphenoxylate-atropine (LOMOTIL) 2.5-0.025 MG per tablet      Take 1 tablet by mouth 1-2 times daily as needed   60 tablet   4   . fenofibrate (TRICOR) 48 MG tablet      TAKE ONE TABLET EVERY DAY   30 tablet   3   . mercaptopurine (PURINETHOL) 50 MG tablet      Take 2 tablets by mouth once daily   60 tablet   2   . promethazine (PHENERGAN) 25 MG suppository   Rectal   Place 25 mg rectally once. Pt is not prescribed this medication. Wife gave med to pt.          BP 84/54  Pulse 102  Temp(Src) 98.1 F (36.7 C) (Oral)  Resp 16  SpO2 99% Physical Exam  Nursing note and vitals reviewed. Constitutional: He is oriented to person, place, and time. No distress.  Elderly, ill-appearing, nontoxic  HENT:  Head: Normocephalic and atraumatic.  mucous membranes dry  Eyes: Pupils are equal, round, and reactive to light.  Neck: Neck supple.  Cardiovascular: Regular rhythm and normal heart sounds.   No murmur heard. Tachycardia  Pulmonary/Chest: Effort  normal and breath sounds normal. No respiratory distress. He has no wheezes.  Abdominal: Soft. Bowel sounds are normal. There is no tenderness. There is no rebound.  Mild distention without rebound or guarding, midline abdominal scar noted  Musculoskeletal: He exhibits no edema.  Lymphadenopathy:    He has no cervical adenopathy.  Neurological: He is alert and oriented to person, place, and time.  Skin: Skin is warm.  Clammy  Psychiatric: He has a normal mood and affect.    ED Course  Procedures (including critical care time)  CRITICAL CARE Performed by: Thayer Jew, F   Total critical care time: 35  min  Critical care time was exclusive of separately billable procedures and treating other patients.  Critical care was necessary to treat or prevent imminent or life-threatening deterioration.  Critical care was time spent personally by me on the following activities: development of treatment plan with patient and/or surrogate as well as nursing, discussions with consultants, evaluation of patient's response to treatment, examination of patient, obtaining history from patient or surrogate, ordering and performing treatments and interventions, ordering and review of laboratory studies, ordering and review of radiographic studies, pulse oximetry and re-evaluation of patient's condition. Labs Review Labs Reviewed  CBC WITH DIFFERENTIAL - Abnormal; Notable for the following:    Neutrophils Relative % 79 (*)    All other components within normal limits  COMPREHENSIVE METABOLIC PANEL - Abnormal; Notable for the following:    Sodium 147 (*)    Potassium 3.3 (*)    CO2 18 (*)    Glucose, Bld 166 (*)    BUN 24 (*)    Creatinine, Ser 2.90 (*)    AST 52 (*)    Total Bilirubin 2.2 (*)    GFR calc non Af Amer 20 (*)    GFR calc Af Amer 23 (*)    All other components within normal limits  CG4 I-STAT (LACTIC ACID) - Abnormal; Notable for the following:    Lactic Acid, Venous 7.69 (*)    All other components within normal limits  CULTURE, BLOOD (ROUTINE X 2)  CULTURE, BLOOD (ROUTINE X 2)  LIPASE, BLOOD  TROPONIN I  URINALYSIS, ROUTINE W REFLEX MICROSCOPIC   Imaging Review Dg Chest Port 1 View  12/09/2012   CLINICAL DATA:  Weakness and vomiting.  EXAM: PORTABLE CHEST - 1 VIEW  COMPARISON:  03/01/2005.  FINDINGS: The cardiac silhouette, mediastinal and hilar contours are within normal limits. Low lung volumes with vascular crowding and streaky basilar atelectasis. The right lower lung lesion is no longer visualized. This was shown to be fluid/ pseudotumor in the fissure. The bony thorax is intact.   IMPRESSION: Low lung volumes with vascular crowding and streaky basilar atelectasis but no infiltrates or effusions.   Electronically Signed   By: Kalman Jewels M.D.   On: 12/09/2012 22:16   EKG independently reviewed by myself: Sinus tachycardia with a rate of 118, no evidence of acute elevation. There is ST depression noted in the lateral and inferior leads.  MDM  No diagnosis found.   1.  EMesis 2.  Lactic Acidosis 3.  Dehydration 4.  Acute kidney injury  This is a 78 year old male who presents with vomiting. He was noted to be tachycardic and hypotensive on initial evaluation. He is ill-appearing but nontoxic. Abdominal exam is only positive for mild distention without tenderness. Patient was given 2 L of fluid. Lab work was obtained including lactate which was 7.69. Patient's BMP shows evidence of acute dehydration  and volume contraction. Creatinine is 2.9. White count is normal.   The patient is afebrile, without white count, and not hypoxic. He is tachycardic  (meets 1 SIRS criteria). Blood cultures obtained but antibiotics not initiated.  Patient has had significant improvement of his tachycardia with 2 L of fluids. His blood pressures remained soft with systolics in the 15X to 45O. He is very volume depleted.  Repeat pulmonary exam continues to show clear breath sounds.  Patient will be given another liter of fluid over 2 hours. CT scan with oral contrast will be obtained to evaluate for intra-abdominal pathology given history of crohns and SBO.  The patient's primary doctor is Leighton Ruff with [facility]. Patient prefers to be admitted to [place] been signed out to Dr. Karle Starch pending CT.   Merryl Hacker, MD 12/10/12 5929  Merryl Hacker, MD 12/10/12 [place], MD 12/10/12 [phone]

## 2012-12-10 NOTE — Progress Notes (Signed)
Pt had episode of emesis despite NG tube being hooked to LIMS. During emesis, HR became tachycardic in 140s, SpO2 sats fell from 94% on 4L to 85%. Placed on venturi mask at 50%, sats returned to 90s. Pt vomited ~250cc's bilious, foul smelling fluid with chunks of undigested food present. Pt was very shaky during episode, but said that "yesterday was worse and more of it."   PRN zofran given to pt IV. After episode HR remained tachycardic 110s. Unable to wean back to 4L [place], pt remains on 50% venturi mask at this time. MD ([place]) notified. Pt is able to cough and clear throat on command. Has strong cough, but is wet.   Pt had productive cough upon admission and "for past few week" per wife and pt report.   Will monitor.   Rayburn Ma, RN

## 2012-12-10 NOTE — Progress Notes (Signed)
Md notified of new temp 38.1, pt having chills and shivering. HR was SR 90 now ST 150. Received order for tylenol prn.

## 2012-12-10 NOTE — H&P (Signed)
PULMONARY  / CRITICAL CARE MEDICINE  Name: Ricardo Villa MRN: [id number] DOB: 13-Jul-1934    ADMISSION DATE:  12/09/2012 CONSULTATION DATE:  12/10/2012   REFERRING MD :  Karle Starch EDP PRIMARY SERVICE: PCCM  CHIEF COMPLAINT:  Abdominal Pain  BRIEF PATIENT DESCRIPTION: 78 y/o male with Crohn's disease admitted on 10/2 with small bowel obstruction and septic shock.  SIGNIFICANT EVENTS / STUDIES:  10/2 CT abdomen> SBO, transition point R abdomen near prior surgical changes  LINES / TUBES: 10/2 L IJ CVL >>  CULTURES: 10/2 Blood >>  ANTIBIOTICS: 10/2 zosyn >>  HISTORY OF PRESENT ILLNESS:  78 y/o male with Crohn's disease admitted on 10/2 with small bowel obstruction and septic shock.  He noted the sudden onset of nausea and vomiting on 10/2 without abdominal pain.  He threw up multiple times throughout the day, never seeing hematemesis.  He denied fevers or chills.  He last kept down breakfast on 9/30.  In the [place] ER he was noted to have a small bowel obstruction and septic shock with an elevated lactic acid.  PAST MEDICAL HISTORY :  Past Medical History  Diagnosis Date  . Hyperhomocysteinemia   . Hyperlipidemia   . Small bowel obstruction   . History of adenomatous polyp of colon   . Vitamin B12 deficiency   . Crohn disease   . Prostate cancer    Past Surgical History  Procedure Laterality Date  . Cholecystectomy    . Terminal ileum resection    . Appendectomy     Prior to Admission medications   Medication Sig Start Date End Date Taking? Authorizing Provider  diphenoxylate-atropine (LOMOTIL) 2.5-0.025 MG per tablet Take 1 tablet by mouth 1-2 times daily as needed 10/27/12  Yes Lafayette Dragon, MD  fenofibrate (TRICOR) 48 MG tablet TAKE ONE TABLET EVERY DAY 11/14/12  Yes Larey Seat, MD  mercaptopurine (PURINETHOL) 50 MG tablet Take 2 tablets by mouth once daily 10/27/12  Yes Lafayette Dragon, MD  promethazine (PHENERGAN) 25 MG suppository Place 25 mg  rectally once. Pt is not prescribed this medication. Wife gave med to pt.   Yes Historical Provider, MD   No Known Allergies  FAMILY HISTORY:  Family History  Problem Relation Age of Onset  . Crohn's disease Mother   . Heart disease Father   . Diabetes Paternal Grandfather   . Colon cancer Neg Hx    SOCIAL HISTORY:  reports that he has quit smoking. He has never used smokeless tobacco. He reports that  drinks alcohol. He reports that he does not use illicit drugs.  REVIEW OF SYSTEMS:   Gen: Denies fever, chills, weight change, fatigue, night sweats HEENT: Denies blurred vision, double vision, hearing loss, tinnitus, sinus congestion, rhinorrhea, sore throat, neck stiffness, dysphagia PULM: Denies shortness of breath, +cough, denies sputum production, hemoptysis, wheezing CV: Denies chest pain, edema, orthopnea, paroxysmal nocturnal dyspnea, palpitations GI: per HPI GU: Denies dysuria, hematuria, polyuria, oliguria, urethral discharge Endocrine: Denies hot or cold intolerance, polyuria, polyphagia or appetite change Derm: Denies rash, dry skin, scaling or peeling skin change Heme: Denies easy bruising, bleeding, bleeding gums Neuro: Denies headache, numbness, weakness, slurred speech, loss of memory or consciousness   SUBJECTIVE:   VITAL SIGNS: Temp:  [98.1 F (36.7 C)] 98.1 F (36.7 C) (10/01 2133) Pulse Rate:  [96-128] 96 (10/02 0323) Resp:  [16-18] 16 (10/02 0323) BP: (67-88)/(38-56) 75/52 mmHg (10/02 0323) SpO2:  [88 %-99 %] 91 % (10/02 0323) HEMODYNAMICS:  VENTILATOR SETTINGS:   INTAKE / OUTPUT: Intake/Output   None     PHYSICAL EXAMINATION:  Gen: no acute distress HEENT: NCAT, PERRL, EOMi, OP clear, neck supple without masses PULM: Few crackles in bases bilaterally, good air movement CV: RRR, no mgr, no JVD AB: No bowel sounds, soft, mildly tender, no hsm Ext: warm, no edema, no clubbing, no cyanosis Derm: no rash or skin breakdown Neuro: A&Ox4, CN II-XII  intact,  MAEW   LABS:  CBC Recent Labs     12/09/12  2150  WBC  4.8  HGB  15.6  HCT  45.8  PLT  268   Coag's No results found for this basename: APTT, INR,  in the last 72 hours BMET Recent Labs     12/09/12  2150  NA  147*  K  3.3*  CL  105  CO2  18*  BUN  24*  CREATININE  2.90*  GLUCOSE  166*   Electrolytes Recent Labs     12/09/12  2150  CALCIUM  10.5   Sepsis Markers No results found for this basename: LACTICACIDVEN, PROCALCITON, O2SATVEN,  in the last 72 hours ABG No results found for this basename: PHART, PCO2ART, PO2ART,  in the last 72 hours Liver Enzymes Recent Labs     12/09/12  2150  AST  52*  ALT  31  ALKPHOS  60  BILITOT  2.2*  ALBUMIN  3.9   Cardiac Enzymes Recent Labs     12/09/12  2223  TROPONINI  <0.30   Glucose No results found for this basename: GLUCAP,  in the last 72 hours   CXR: low lung volumes, ? Atelectasis in bases  ASSESSMENT / PLAN:  GASTROINTESTINAL A:  SBO in setting of Crohn's disease P:   -General surgery to evaluate  -NG tube -Pantoprazole for stress ulcer prophylaxis -consult GI [place] 10/2 per patient request  PULMONARY A: Aspiration pneumonitis without respiratory failure P:   -no need for mechanical ventilation at this point -Incentive spirometry -OOB as tolerated  CARDIOVASCULAR A: Septic shock P:  -place CVL -start sepsis protocol -measure CVP -levophed for MAP > 65 -repeat lactic acid and troponin -12 lead -tele  RENAL A:  AKI due to sepsis, baseline Cr 1.3-1.5  P:   -Foley -monitor UOP -repeat BMET 1100AM 10/2   HEMATOLOGIC A:  No acute issues  P:  -type and screen for possible surgery  INFECTIOUS A:  Septic shock due to an intra-abdominal source; concern for gut translocation in setting of SBO; no intra-abdominal free air on CT abdomen P:   -sepsis protocol -start zosyn  ENDOCRINE A:  No acute issues P:   -monitor glucose  NEUROLOGIC A:  No acute issues P:    -fentanyl prn pain  Code: Full Family: updated wife at bedside  TODAY'S SUMMARY:    I have personally obtained a history, examined the patient, evaluated laboratory and imaging results, formulated the assessment and plan and placed orders. CRITICAL CARE: The patient is critically ill with multiple organ systems failure and requires high complexity decision making for assessment and support, frequent evaluation and titration of therapies, application of advanced monitoring technologies and extensive interpretation of multiple databases. Critical Care Time devoted to patient care services described in this note is 60 minutes.   Lemmie Evens Pulmonary and [place] Pager: [phone]  12/10/2012, 4:14 AM

## 2012-12-10 NOTE — ED Notes (Signed)
Pt's O2 sat dropped to 88 on 2L O2 via [place]. O2 increased to 4L and pt encouraged to deep breathe. RT asked to evaluate pt. She placed pt on venti mask at 50% and sat stabilized at 92.

## 2012-12-10 NOTE — Progress Notes (Signed)
eLink Physician-Brief Progress Note Patient Name: Ricardo Villa DOB: [birth year] MRN: [id number]  Date of Service  12/10/2012   HPI/Events of Note   GPC in clusters / blood  eICU Interventions   Vancomycin added   Intervention Category Intermediate Interventions: Communication with other healthcare providers and/or family  Doree Fudge 12/10/2012, 10:12 PM

## 2012-12-10 NOTE — Telephone Encounter (Signed)
Spoke with wife and told her patient's hospital MD has sent Dr. Olevia Perches a note. Told her if she wants a consult to ask his hospital MD for this.

## 2012-12-10 NOTE — Progress Notes (Addendum)
ANTIBIOTIC CONSULT NOTE - INITIAL  Pharmacy Consult for vancomycin Indication: bacteremia  No Known Allergies  Patient Measurements: Height: 5' 11"  (180.3 cm) Weight: 169 lb 1.5 oz (76.7 kg) IBW/kg (Calculated) : 75.3   Vital Signs: Temp: 100.9 F (38.3 C) (10/02 2200) Temp src: Core (Comment) (10/02 2200) BP: 103/53 mmHg (10/02 2215) Pulse Rate: 103 (10/02 2215) Intake/Output from previous day: 10/01 0701 - 10/02 0700 In: 7851.5 [I.V.:2791.5; IV Piggyback:2050] Out: 600 [Urine:100; Emesis/NG output:500] Intake/Output from this shift: Total I/O In: 401.3 [I.V.:401.3] Out: 470 [Urine:120; Stool:350]  Labs:  Recent Labs  12/09/12 2150 12/10/12 0550 12/10/12 1140  WBC 4.8  --   --   HGB 15.6  --   --   PLT 268  --   --   CREATININE 2.90* 2.86* 2.32*   Estimated Creatinine Clearance: 28.9 ml/min (by C-G formula based on Cr of 2.32). No results found for this basename: VANCOTROUGH, Corlis Leak, VANCORANDOM, [place], GENTPEAK, [place], [place], TOBRAPEAK, TOBRARND, AMIKACINPEAK, AMIKACINTROU, AMIKACIN,  in the last 72 hours   Microbiology: Recent Results (from the past 720 hour(s))  CULTURE, BLOOD (ROUTINE X 2)     Status: None   Collection Time    12/09/12 10:20 PM      Result Value Range Status   Specimen Description BLOOD LEFT WRIST   Final   Special Requests     Final   Value: BOTTLES DRAWN AEROBIC AND ANAEROBIC AER 5cc ANA 5cc   Culture  Setup Time     Final   Value: 12/10/2012 03:56     Performed at [facility]   Culture     Final   Value: [place] IN CLUSTERS     Note: Gram Stain Report Called to,Read Back By and Verified With: MANDIE ROLLS ON 12/10/2012 AT 10:05P BY WILEJ     Performed at [facility]   Report Status PENDING   Incomplete  MRSA PCR SCREENING     Status: None   Collection Time    12/10/12  6:07 AM      Result Value Range Status   MRSA by PCR NEGATIVE  NEGATIVE Final   Comment:            The  GeneXpert MRSA Assay (FDA     approved for NASAL specimens     only), is one component of a     comprehensive MRSA colonization     surveillance program. It is not     intended to diagnose MRSA     infection nor to guide or     monitor treatment for     MRSA infections.    Medical History: Past Medical History  Diagnosis Date  . Hyperhomocysteinemia   . Hyperlipidemia   . Small bowel obstruction   . History of adenomatous polyp of colon   . Vitamin B12 deficiency   . Crohn disease   . Prostate cancer     Medications:  Scheduled:  . heparin  5,000 Units Subcutaneous Q8H  . pantoprazole (PROTONIX) IV  40 mg Intravenous QHS  . piperacillin-tazobactam (ZOSYN)  IV  3.375 g Intravenous Q8H   Assessment: 78 yo man to start vancomycin for gpc in blood cultures.  He is also on zosyn.  Goal of Therapy:  Vancomycin trough level 15-20 mcg/ml  Plan:  Vancomycin 1250 mg IV X 1 then 750 mg IV q24 hours. F/u renal function, cultures and clinical progress  Seay, Lora Poteet 12/10/2012,10:44 PM  Addendum: SCr continues  to trend down. UOP is good.  Increase maintenance dose of vancomycin to 1000 mg IV q24h  [facility], Pharm.D., BCPS Clinical Pharmacist Pager: [phone] 12/11/2012 1:53 PM

## 2012-12-10 NOTE — Consult Note (Signed)
Agree with above.  Small bowel obstruction secondary to narrowing at previous ileocolic anastomosis.  This is likely a chronic obstruction since he has calcified pills proximal to the anastomosis.  Will attempt conservative therapy, but may need exploratory laparotomy with possible revision of his anastomosis.  Imogene Burn. Georgette Dover, MD, [facility]  General/ Trauma Surgery  12/10/2012 8:43 AM

## 2012-12-10 NOTE — ED Notes (Signed)
Patient transported to CT 

## 2012-12-10 NOTE — Consult Note (Signed)
Blackwood 1934-09-08  [id number].   Requesting MD: Dr. Simonne Maffucci Chief Complaint/Reason for Consult: SBO HPI: This is a 78 yo male with a h/o crohn's disease with prior ileocecectomy with ileocolic anastomosis done 30+ years ago.  The patient has done well with this.  The wife tells me that Dr. [name] has told them in the past that his anastomosis was tight; however, his last c-scope from 2011 does not say this.  The patient developed nausea and vomiting along with some abdominal pain yesterday while at work.  His last BM was yesterday morning and has not had any flatus or BM since then.  He continued to have multiple episodes of emesis and presented [facility] where he was found to have a SBO likely secondary to a stricture at his ileocolonic anastomosis.  His Cr was 2.9 as well.  He was transferred to [facility] where we have been asked to evaluate him.  Review of Systems: Please see HPI, otherwise all other systems have been reviewed and are negative.  Family History  Problem Relation Age of Onset  . Crohn's disease Mother   . Heart disease Father   . Diabetes Paternal Grandfather   . Colon cancer Neg Hx     Past Medical History  Diagnosis Date  . Hyperhomocysteinemia   . Hyperlipidemia   . Small bowel obstruction   . History of adenomatous polyp of colon   . Vitamin B12 deficiency   . Crohn disease   . Prostate cancer     Past Surgical History  Procedure Laterality Date  . Cholecystectomy    . Terminal ileum resection    . Appendectomy      Social History:  reports that he has quit smoking. He has never used smokeless tobacco. He reports that  drinks alcohol. He reports that he does not use illicit drugs.  Allergies: No Known Allergies  Medications Prior to Admission  Medication Sig Dispense Refill  . diphenoxylate-atropine (LOMOTIL) 2.5-0.025 MG per tablet Take 1 tablet by mouth 1-2 times daily as needed  60 tablet  4  . fenofibrate (TRICOR) 48 MG  tablet TAKE ONE TABLET EVERY DAY  30 tablet  3  . mercaptopurine (PURINETHOL) 50 MG tablet Take 2 tablets by mouth once daily  60 tablet  2  . promethazine (PHENERGAN) 25 MG suppository Place 25 mg rectally once. Pt is not prescribed this medication. Wife gave med to pt.        Blood pressure 111/59, pulse 95, temperature 97.3 F (36.3 C), temperature source Oral, resp. rate 22, height 5' 11"  (1.803 m), weight 169 lb 1.5 oz (76.7 kg), SpO2 97.00%. Physical Exam: General: pleasant, WD, WN white male who is laying in bed in NAD HEENT: head is normocephalic, atraumatic.  Sclera are noninjected.  PERRL.  Ears and nose without any masses or lesions.  Mouth is pink and moist.  NRB in place as well as NGT with feculent appearing output. Heart: regular, rate, and rhythm.  Normal s1,s2. No obvious murmurs, gallops, or rubs noted.  Palpable radial and pedal pulses bilaterally Lungs: CTAB, no wheezes, rhonchi, or rales noted.  Respiratory effort nonlabored Abd: soft, NT, mild distention, hypoactive BS, no masses, hernias, or organomegaly.  Right upper quadrant oblique scar as well as midline scar from prior surgeries MS: all 4 extremities are symmetrical with no cyanosis, clubbing, or edema. Skin: warm and dry with no masses, lesions, or rashes Psych: A&Ox3 with an appropriate affect.  Results for orders placed during the hospital encounter of 12/09/12 (from the past 48 hour(s))  CBC WITH DIFFERENTIAL     Status: Abnormal   Collection Time    12/09/12  9:50 PM      Result Value Range   WBC 4.8  4.0 - 10.5 K/uL   RBC 4.89  4.22 - 5.81 MIL/uL   Hemoglobin 15.6  13.0 - 17.0 g/dL   HCT 45.8  39.0 - 52.0 %   MCV 93.7  78.0 - 100.0 fL   MCH 31.9  26.0 - 34.0 pg   MCHC 34.1  30.0 - 36.0 g/dL   RDW 15.1  11.5 - 15.5 %   Platelets 268  150 - 400 K/uL   Neutrophils Relative % 79 (*) 43 - 77 %   Neutro Abs 3.8  1.7 - 7.7 K/uL   Lymphocytes Relative 18  12 - 46 %   Lymphs Abs 0.9  0.7 - 4.0 K/uL    Monocytes Relative 3  3 - 12 %   Monocytes Absolute 0.1  0.1 - 1.0 K/uL   Eosinophils Relative 0  0 - 5 %   Eosinophils Absolute 0.0  0.0 - 0.7 K/uL   Basophils Relative 0  0 - 1 %   Basophils Absolute 0.0  0.0 - 0.1 K/uL  COMPREHENSIVE METABOLIC PANEL     Status: Abnormal   Collection Time    12/09/12  9:50 PM      Result Value Range   Sodium 147 (*) 135 - 145 mEq/L   Potassium 3.3 (*) 3.5 - 5.1 mEq/L   Chloride 105  96 - 112 mEq/L   CO2 18 (*) 19 - 32 mEq/L   Glucose, Bld 166 (*) 70 - 99 mg/dL   BUN 24 (*) 6 - 23 mg/dL   Creatinine, Ser 2.90 (*) 0.50 - 1.35 mg/dL   Calcium 10.5  8.4 - 10.5 mg/dL   Total Protein 7.3  6.0 - 8.3 g/dL   Albumin 3.9  3.5 - 5.2 g/dL   AST 52 (*) 0 - 37 U/L   ALT 31  0 - 53 U/L   Alkaline Phosphatase 60  39 - 117 U/L   Total Bilirubin 2.2 (*) 0.3 - 1.2 mg/dL   GFR calc non Af Amer 20 (*) >90 mL/min   GFR calc Af Amer 23 (*) >90 mL/min   Comment: (NOTE)     The eGFR has been calculated using the CKD EPI equation.     This calculation has not been validated in all clinical situations.     eGFR's persistently <90 mL/min signify possible Chronic Kidney     Disease.  CG4 I-STAT (LACTIC ACID)     Status: Abnormal   Collection Time    12/09/12  9:57 PM      Result Value Range   Lactic Acid, Venous 7.69 (*) 0.5 - 2.2 mmol/L  LIPASE, BLOOD     Status: None   Collection Time    12/09/12 10:23 PM      Result Value Range   Lipase 16  11 - 59 U/L  TROPONIN I     Status: None   Collection Time    12/09/12 10:23 PM      Result Value Range   Troponin I <0.30  <0.30 ng/mL   Comment:            Due to the release kinetics of cTnI,     a negative result within the first  hours     of the onset of symptoms does not rule out     myocardial infarction with certainty.     If myocardial infarction is still suspected,     repeat the test at appropriate intervals.  CG4 I-STAT (LACTIC ACID)     Status: Abnormal   Collection Time    12/10/12  1:22 AM       Result Value Range   Lactic Acid, Venous 3.26 (*) 0.5 - 2.2 mmol/L  GLUCOSE, CAPILLARY     Status: None   Collection Time    12/10/12  4:46 AM      Result Value Range   Glucose-Capillary 91  70 - 99 mg/dL   Comment 1 Documented in Chart     Comment 2 Notify RN    LACTIC ACID, PLASMA     Status: None   Collection Time    12/10/12  5:50 AM      Result Value Range   Lactic Acid, Venous 2.2  0.5 - 2.2 mmol/L  TROPONIN I     Status: None   Collection Time    12/10/12  5:50 AM      Result Value Range   Troponin I <0.30  <0.30 ng/mL   Comment:            Due to the release kinetics of cTnI,     a negative result within the first hours     of the onset of symptoms does not rule out     myocardial infarction with certainty.     If myocardial infarction is still suspected,     repeat the test at appropriate intervals.  TYPE AND SCREEN     Status: None   Collection Time    12/10/12  5:50 AM      Result Value Range   ABO/RH(D) A POS     Antibody Screen NEG     Sample Expiration 12/13/2012    PROTIME-INR     Status: None   Collection Time    12/10/12  5:50 AM      Result Value Range   Prothrombin Time 14.3  11.6 - 15.2 seconds   INR 1.13  0.00 - 1.49  CREATININE, SERUM     Status: Abnormal   Collection Time    12/10/12  5:50 AM      Result Value Range   Creatinine, Ser 2.86 (*) 0.50 - 1.35 mg/dL   GFR calc non Af Amer 20 (*) >90 mL/min   GFR calc Af Amer 23 (*) >90 mL/min   Comment: (NOTE)     The eGFR has been calculated using the CKD EPI equation.     This calculation has not been validated in all clinical situations.     eGFR's persistently <90 mL/min signify possible Chronic Kidney     Disease.  ABO/RH     Status: None   Collection Time    12/10/12  5:50 AM      Result Value Range   ABO/RH(D) A POS    URINALYSIS, ROUTINE W REFLEX MICROSCOPIC     Status: Abnormal   Collection Time    12/10/12  6:07 AM      Result Value Range   Color, Urine AMBER (*) YELLOW    Comment: BIOCHEMICALS MAY BE AFFECTED BY COLOR   APPearance CLOUDY (*) CLEAR   Specific Gravity, Urine 1.027  1.005 - 1.030   pH 5.0  5.0 - 8.0   Glucose, UA  NEGATIVE  NEGATIVE mg/dL   Hgb urine dipstick NEGATIVE  NEGATIVE   Bilirubin Urine LARGE (*) NEGATIVE   Ketones, ur 15 (*) NEGATIVE mg/dL   Protein, ur NEGATIVE  NEGATIVE mg/dL   Urobilinogen, UA 1.0  0.0 - 1.0 mg/dL   Nitrite NEGATIVE  NEGATIVE   Leukocytes, UA TRACE (*) NEGATIVE  MRSA PCR SCREENING     Status: None   Collection Time    12/10/12  6:07 AM      Result Value Range   MRSA by PCR NEGATIVE  NEGATIVE   Comment:            The GeneXpert MRSA Assay (FDA     approved for NASAL specimens     only), is one component of a     comprehensive MRSA colonization     surveillance program. It is not     intended to diagnose MRSA     infection nor to guide or     monitor treatment for     MRSA infections.  URINE MICROSCOPIC-ADD ON     Status: Abnormal   Collection Time    12/10/12  6:07 AM      Result Value Range   Squamous Epithelial / LPF RARE  RARE   WBC, UA 0-2  <3 WBC/hpf   RBC / HPF 0-2  <3 RBC/hpf   Bacteria, UA FEW (*) RARE   Casts GRANULAR CAST (*) NEGATIVE   Comment: HYALINE CASTS   Crystals CA OXALATE CRYSTALS (*) NEGATIVE   Urine-Other AMORPHOUS URATES/PHOSPHATES     Ct Abdomen Pelvis Wo Contrast  12/10/2012   CLINICAL DATA:  Abdominal pain, nausea, vomiting. History of Crohn's disease.  EXAM: CT ABDOMEN AND PELVIS WITHOUT CONTRAST  TECHNIQUE: Multidetector CT imaging of the abdomen and pelvis was performed following the standard protocol without intravenous contrast.  COMPARISON:  01/14/2003  FINDINGS: Bilateral lower lobe airspace opacities are noted, some reticulonodular densities in other interstitial thickening and ground-glass opacities. Findings are nonspecific but may represent chronic MAI infection. No pleural effusions. Heart is borderline in size.  Small bowel loops are dilated. Transition point is in  the right abdomen in the area of prior surgical clips compatible with small bowel obstruction. No active inflammatory process visualized. Multiple radiopaque pills within the dilated small bowel loops. This could be related to Crohn's or postsurgical stricture.  Prior cholecystectomy. Liver, spleen, pancreas, adrenals and kidneys are unremarkable. Small nonobstructing stone in the lower pole of the right kidney. No ureteral stones or hydronephrosis.  Urinary bladder is decompressed, grossly unremarkable. Trace free fluid in the pelvis. Aorta and iliac vessels are calcified, non aneurysmal.  No acute bony abnormality.  IMPRESSION: Dilated, fluid-filled small bowel loops compatible with small bowel obstruction. Transition point in the right abdomen in the area of prior small bowel surgical changes.   Electronically Signed   By: Rolm Baptise M.D.   On: 12/10/2012 00:53   Dg Chest Port 1 View  12/10/2012   CLINICAL DATA:  Support devices, central line placement.  EXAM: PORTABLE CHEST - 1 VIEW  COMPARISON:  12/09/2012  FINDINGS: Interval placement of left central line. The tip is in the upper right atrium. No pneumothorax. Bibasilar atelectasis. Heart is normal size. No effusions.  IMPRESSION: Left central line tip at the upper right atrium. No pneumothorax.  Bibasilar atelectasis.   Electronically Signed   By: Rolm Baptise M.D.   On: 12/10/2012 06:44   Dg Chest Port 1 View  12/09/2012  CLINICAL DATA:  Weakness and vomiting.  EXAM: PORTABLE CHEST - 1 VIEW  COMPARISON:  03/01/2005.  FINDINGS: The cardiac silhouette, mediastinal and hilar contours are within normal limits. Low lung volumes with vascular crowding and streaky basilar atelectasis. The right lower lung lesion is no longer visualized. This was shown to be fluid/ pseudotumor in the fissure. The bony thorax is intact.  IMPRESSION: Low lung volumes with vascular crowding and streaky basilar atelectasis but no infiltrates or effusions.   Electronically  Signed   By: Kalman Jewels M.D.   On: 12/09/2012 22:16       Assessment/Plan Patient Active Problem List   Diagnosis Date Noted  . SBO (small bowel obstruction) 12/10/2012  . Septic shock(785.52) 12/10/2012  . AKI (acute kidney injury) 12/10/2012  . VITAMIN B12 DEFICIENCY 03/23/2008  . 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 HYPERLIPIDEMIA 03/23/2008  . CROHN'S DISEASE 03/23/2008  . IRON DEFICIENCY ANEMIA, HX OF 03/23/2008  . COLONIC POLYPS, ADENOMATOUS, HX OF 03/23/2008  . SMALL BOWEL OBSTRUCTION, HX OF 03/23/2008   Plan: 1. We will obtain repeat abdominal films today, as well as follow the patient clinically.  His CT scan definitely shows some narrowing at his prior anastomosis.  Hopefully, with some decompression and conservative management, the patient may open up, but if not he will require an abdominal operation.  This has been discussed with the patient and his wife.  They have asked for [place] to let Dr. Olevia Perches know as well.  I will send her a message in epic.  We will continue to follow this patient closely.  Edmond Ginsberg E 12/10/2012, 8:29 AM Pager: [phone]

## 2012-12-10 NOTE — ED Notes (Signed)
MD at bedside discussing admission

## 2012-12-10 NOTE — Progress Notes (Signed)
PULMONARY  / CRITICAL CARE MEDICINE  Name: Ricardo Villa MRN: [id number] DOB: 07-May-1934    ADMISSION DATE:  12/09/2012 CONSULTATION DATE:  12/10/2012   REFERRING MD :  Karle Starch EDP PRIMARY SERVICE: PCCM  CHIEF COMPLAINT:  Abdominal Pain  BRIEF PATIENT DESCRIPTION: 78 y/o male with Crohn's disease admitted on 10/2 with small bowel obstruction and septic shock.  SIGNIFICANT EVENTS / STUDIES:  10/2 CT abdomen: SBO, transition point R abdomen near prior surgical changes  LINES / TUBES: L IJ CVL 10/2 >>   CULTURES: MRSA PCR 10/02 >> NEG Urine 10/02 >>  Blood 10/2 >>  ANTIBIOTICS: 10/2 zosyn >>  SUBJECTIVE:  No new complaints  VITAL SIGNS: Temp:  [97.3 F (36.3 C)-99.2 F (37.3 C)] 97.3 F (36.3 C) (10/02 0800) Pulse Rate:  [44-128] 95 (10/02 0815) Resp:  [15-24] 22 (10/02 0815) BP: (67-118)/(38-70) 111/59 mmHg (10/02 0815) SpO2:  [88 %-100 %] 97 % (10/02 0815) FiO2 (%):  [50 %] 50 % (10/02 0800) Weight:  [76.7 kg (169 lb 1.5 oz)] 76.7 kg (169 lb 1.5 oz) (10/02 0435) HEMODYNAMICS: CVP:  [1 mmHg-6 mmHg] 1 mmHg VENTILATOR SETTINGS: Vent Mode:  [-]  FiO2 (%):  [50 %] 50 % INTAKE / OUTPUT: Intake/Output     10/01 0701 - 10/02 0700 10/02 0701 - 10/03 0700   I.V. (mL/kg) 2791.5 (36.4) 168.8 (2.2)   Other 10 10   IV Piggyback 50    Total Intake(mL/kg) 2851.5 (37.2) 178.8 (2.3)   Urine (mL/kg/hr) 100 80 (0.4)   Emesis/NG output 500    Total Output 600 80   Net +2251.5 +98.8          PHYSICAL EXAMINATION:  Gen: no acute distress HEENT: WNL PULM: clear anteriorly CV: RRR s M AB: soft, minimally tender, + BS Ext: no edema Neuro: no focal deficits   LABS:  CBC Recent Labs     12/09/12  2150  WBC  4.8  HGB  15.6  HCT  45.8  PLT  268   Coag's Recent Labs     12/10/12  0550  INR  1.13   BMET Recent Labs     12/09/12  2150  12/10/12  0550  NA  147*   --   K  3.3*   --   CL  105   --   CO2  18*   --   BUN  24*   --   CREATININE  2.90*   2.86*  GLUCOSE  166*   --    Electrolytes Recent Labs     12/09/12  2150  CALCIUM  10.5   Sepsis Markers No results found for this basename: LACTICACIDVEN, PROCALCITON, O2SATVEN,  in the last 72 hours ABG No results found for this basename: PHART, PCO2ART, PO2ART,  in the last 72 hours Liver Enzymes Recent Labs     12/09/12  2150  AST  52*  ALT  31  ALKPHOS  60  BILITOT  2.2*  ALBUMIN  3.9   Cardiac Enzymes Recent Labs     12/09/12  2223  12/10/12  0550  TROPONINI  <0.30  <0.30   Glucose Recent Labs     12/10/12  0446  GLUCAP  91     CXR: NAD  ASSESSMENT / PLAN:  GASTROINTESTINAL A:  SBO - clinically seems to be improving Crohn's Dz Prior laparotomy P:   Cont NGT suction Cont NPO CCS following 2v abd xrays per [facility]   PULMONARY A: Suspected aspiration  Clear CXR but R>L basilar infiltrates on CT abdomen P:   Cont supplemental O2 Cont airway hygiene   CARDIOVASCULAR A: Hypovolemic/septic shock - improved after 6 l NS P:  D/C further CVP monitoring Cont to wean NE for MAP > 65 mmHg  RENAL A:  AKI due to sepsis/hypovolemia, baseline Cr 1.3-1.5 Uo improving P:   Monitor BMET intermittently Correct electrolytes as indicated   HEMATOLOGIC A:  No acute issues  P:  -type and screen for possible surgery  INFECTIOUS A:  Severe sepsis - abdominal (?translocation) and possible aspiration P:   Micro and abx as above  ENDOCRINE A:  No acute issues P:     NEUROLOGIC A:  Mild abd pain P:   -fentanyl prn    TODAY'S SUMMARY:    I have personally obtained a history, examined the patient, evaluated laboratory and imaging results, formulated the assessment and plan and placed orders. CRITICAL CARE: The patient is critically ill with multiple organ systems failure and requires high complexity decision making for assessment and support, frequent evaluation and titration of therapies, application of advanced monitoring technologies and  extensive interpretation of multiple databases. Critical Care Time devoted to patient care services described in this note is 30 minutes.   Baldo [facility] and [place] Pager: [phone]  12/10/2012, 9:36 AM

## 2012-12-10 NOTE — ED Notes (Signed)
MD at bedside discussing plan of care based on CT results. Marland Kitchen

## 2012-12-10 NOTE — ED Notes (Signed)
LAC drawn and resulted. Results hand delivered to Dr. Karlton Lemon. Results 3.26

## 2012-12-10 NOTE — ED Provider Notes (Addendum)
Care assumed at the change of shift, pending CT which is detailed below but in brief shows SBO with transition point near previous abdominal surgery. He has continued to be hypotensive despite 2500cc NS. His HR has improved, he has remained afebrile and states he is feeling better. Denies any SOB but RA SpO2 has dropped into upper 80s. Started on [place] at 2L with good improvement. Discussed with Dr. Joya Gaskins on call for PCCM who will accept the patient for admission to ICU. Family requested [facility] but due to renal dysfunction, Dr. Joya Gaskins would like for the patient to be admitted at [facility]. Family amenable. Dr. Barry Dienes with [facility] also consulted and will evaluate the patient on arrival to [facility]. NG tube placed by nurse for decompression. Pt resting comfortably now, continue with IVF, NPO. Lactate improved considerably from initial presentation.   Diagnoses 1. SBO (small bowel obstruction)   2. Renal failure   3. Dehydration   4. Lactic acidosis   5. Hypovolemic shock      Ct Abdomen Pelvis Wo Contrast  12/10/2012   CLINICAL DATA:  Abdominal pain, nausea, vomiting. History of Crohn's disease.  EXAM: CT ABDOMEN AND PELVIS WITHOUT CONTRAST  TECHNIQUE: Multidetector CT imaging of the abdomen and pelvis was performed following the standard protocol without intravenous contrast.  COMPARISON:  01/14/2003  FINDINGS: Bilateral lower lobe airspace opacities are noted, some reticulonodular densities in other interstitial thickening and ground-glass opacities. Findings are nonspecific but may represent chronic MAI infection. No pleural effusions. Heart is borderline in size.  Small bowel loops are dilated. Transition point is in the right abdomen in the area of prior surgical clips compatible with small bowel obstruction. No active inflammatory process visualized. Multiple radiopaque pills within the dilated small bowel loops. This could be related to Crohn's or postsurgical stricture.  Prior cholecystectomy. Liver, spleen,  pancreas, adrenals and kidneys are unremarkable. Small nonobstructing stone in the lower pole of the right kidney. No ureteral stones or hydronephrosis.  Urinary bladder is decompressed, grossly unremarkable. Trace free fluid in the pelvis. Aorta and iliac vessels are calcified, non aneurysmal.  No acute bony abnormality.  IMPRESSION: Dilated, fluid-filled small bowel loops compatible with small bowel obstruction. Transition point in the right abdomen in the area of prior small bowel surgical changes.   Electronically Signed   By: Rolm Baptise M.D.   On: 12/10/2012 00:53    Shalik B. Karle Starch, MD 12/10/12 0239  Pt remains hypotensive after 3rd liter. Spoke with Dr. Joya Gaskins who agrees with plan to begin Levophed. Temporarily through peripheral line so as not to delay transfer to ICU. He is also now on Vente mask to maintain oxygen saturations but otherwise appears comfortable. [facility] here for transport.   [place] B. Karle Starch, MD 12/10/12 [phone]

## 2012-12-11 ENCOUNTER — Inpatient Hospital Stay (HOSPITAL_COMMUNITY): Payer: Medicare Other

## 2012-12-11 LAB — CBC
Hemoglobin: 10.8 g/dL — ABNORMAL LOW (ref 13.0–17.0)
MCH: 32 pg (ref 26.0–34.0)
MCHC: 34.2 g/dL (ref 30.0–36.0)
MCV: 93.5 fL (ref 78.0–100.0)
RDW: 15.6 % — ABNORMAL HIGH (ref 11.5–15.5)

## 2012-12-11 LAB — COMPREHENSIVE METABOLIC PANEL
ALT: 19 U/L (ref 0–53)
Albumin: 2.1 g/dL — ABNORMAL LOW (ref 3.5–5.2)
CO2: 22 mEq/L (ref 19–32)
Calcium: 7 mg/dL — ABNORMAL LOW (ref 8.4–10.5)
Creatinine, Ser: 1.86 mg/dL — ABNORMAL HIGH (ref 0.50–1.35)
GFR calc Af Amer: 39 mL/min — ABNORMAL LOW (ref >90)
GFR calc non Af Amer: 34 mL/min — ABNORMAL LOW (ref >90)
Glucose, Bld: 130 mg/dL — ABNORMAL HIGH (ref 70–99)
Sodium: 143 mEq/L (ref 135–145)
Total Protein: 4.9 g/dL — ABNORMAL LOW (ref 6.0–8.3)

## 2012-12-11 LAB — URINE CULTURE: Colony Count: NO GROWTH

## 2012-12-11 MED ORDER — VANCOMYCIN HCL IN DEXTROSE 1-5 GM/200ML-% IV SOLN
1000.0000 mg | INTRAVENOUS | Status: DC
  Administered 2012-12-11: 1000 mg via INTRAVENOUS
  Filled 2012-12-11 (×3): qty 200

## 2012-12-11 MED ORDER — PHENOL 1.4 % MT LIQD: 1.0000 | OROMUCOSAL | Status: DC | PRN

## 2012-12-11 NOTE — Progress Notes (Signed)
Subjective: Feels much better, having some flatus and had some loose stool  Objective: Vital signs in last 24 hours: Temp:  [97.3 F (36.3 C)-102.9 F (39.4 C)] 99.3 F (37.4 C) (10/03 0803) Pulse Rate:  [80-156] 89 (10/03 0700) Resp:  [15-33] 16 (10/03 0700) BP: (75-130)/(38-104) 101/50 mmHg (10/03 0700) SpO2:  [82 %-100 %] 96 % (10/03 0700) FiO2 (%):  [50 %] 50 % (10/02 1130) Weight:  [169 lb 1.5 oz (76.7 kg)] 169 lb 1.5 oz (76.7 kg) (10/03 0500) Last BM Date: 12/10/12  Intake/Output from previous day: 10/02 0701 - 10/03 0700 In: 4048.3 [P.O.:50; I.V.:3428.3; NG/GT:60; IV Piggyback:400] Out: 5726 [Urine:1260; Emesis/NG output:1650; Stool:350] Intake/Output this shift:    General appearance: no distress GI: incision well healed, some bs present, nontender, minimally distended  Lab Results:   Recent Labs  12/09/12 2150 12/11/12 0420  WBC 4.8 7.5  HGB 15.6 10.8*  HCT 45.8 31.6*  PLT 268 149*   BMET  Recent Labs  12/10/12 1140 12/11/12 0420  NA 145 143  K 4.0 3.8  CL 113* 113*  CO2 17* 22  GLUCOSE 105* 130*  BUN 32* 32*  CREATININE 2.32* 1.86*  CALCIUM 7.1* 7.0*   PT/INR  Recent Labs  12/10/12 0550  LABPROT 14.3  INR 1.13   ABG No results found for this basename: PHART, PCO2, PO2, HCO3,  in the last 72 hours  Studies/Results: Ct Abdomen Pelvis Wo Contrast  12/10/2012   CLINICAL DATA:  Abdominal pain, nausea, vomiting. History of Crohn's disease.  EXAM: CT ABDOMEN AND PELVIS WITHOUT CONTRAST  TECHNIQUE: Multidetector CT imaging of the abdomen and pelvis was performed following the standard protocol without intravenous contrast.  COMPARISON:  01/14/2003  FINDINGS: Bilateral lower lobe airspace opacities are noted, some reticulonodular densities in other interstitial thickening and ground-glass opacities. Findings are nonspecific but may represent chronic MAI infection. No pleural effusions. Heart is borderline in size.  Small bowel loops are dilated.  Transition point is in the right abdomen in the area of prior surgical clips compatible with small bowel obstruction. No active inflammatory process visualized. Multiple radiopaque pills within the dilated small bowel loops. This could be related to Crohn's or postsurgical stricture.  Prior cholecystectomy. Liver, spleen, pancreas, adrenals and kidneys are unremarkable. Small nonobstructing stone in the lower pole of the right kidney. No ureteral stones or hydronephrosis.  Urinary bladder is decompressed, grossly unremarkable. Trace free fluid in the pelvis. Aorta and iliac vessels are calcified, non aneurysmal.  No acute bony abnormality.  IMPRESSION: Dilated, fluid-filled small bowel loops compatible with small bowel obstruction. Transition point in the right abdomen in the area of prior small bowel surgical changes.   Electronically Signed   By: Rolm Baptise M.D.   On: 12/10/2012 00:53   Dg Chest Port 1 View  12/10/2012   CLINICAL DATA:  Support devices, central line placement.  EXAM: PORTABLE CHEST - 1 VIEW  COMPARISON:  12/09/2012  FINDINGS: Interval placement of left central line. The tip is in the upper right atrium. No pneumothorax. Bibasilar atelectasis. Heart is normal size. No effusions.  IMPRESSION: Left central line tip at the upper right atrium. No pneumothorax.  Bibasilar atelectasis.   Electronically Signed   By: Rolm Baptise M.D.   On: 12/10/2012 06:44   Dg Chest Port 1 View  12/09/2012   CLINICAL DATA:  Weakness and vomiting.  EXAM: PORTABLE CHEST - 1 VIEW  COMPARISON:  03/01/2005.  FINDINGS: The cardiac silhouette, mediastinal and hilar contours are  within normal limits. Low lung volumes with vascular crowding and streaky basilar atelectasis. The right lower lung lesion is no longer visualized. This was shown to be fluid/ pseudotumor in the fissure. The bony thorax is intact.  IMPRESSION: Low lung volumes with vascular crowding and streaky basilar atelectasis but no infiltrates or effusions.    Electronically Signed   By: Kalman Jewels M.D.   On: 12/09/2012 22:16   Dg Abd 2 Views  12/11/2012   *RADIOLOGY REPORT*  Clinical Data: Evaluate small bowel obstruction, subsequent encounter.  ABDOMEN - 2 VIEW  Comparison: 12/10/2012; abdominal CT - 12/09/2012  Findings:  There is persistent moderate gaseous distension of several loops of rather featureless appearing small bowel with index looped within the mid upper abdomen measuring approximately 6.3 cm in diameter. There is a minimal amount of distal colonic gas seen within the splenic flexure and descending colon.  No pneumoperitoneum, pneumatosis or portal venous gas.  Post cholecystectomy.  Enteric staple lines overlies the right mid and lower abdomen. Partially opaque presumed pill fragments overlie the lower abdomen.  Limited visualization of the lower thorax suggests minimal right infrahilar opacities with possible trace bilateral effusions.  Enteric side port overlies the mid/distal esophagus. Central venous catheter tip overlies the superior cavoatrial junction.  No acute osseous abnormality.  IMPRESSION: 1.  Overall findings suggestive of improving but likely persistent partial small bowel obstruction.  2. Malpositioned enteric tube with side port projecting over the mid/distal esophagus.  Advancement is 20 cm is recommended.   Original Report Authenticated By: Jake Seats, MD   Dg Abd 2 Views  12/10/2012   CLINICAL DATA:  Nausea and vomiting yesterday. Small bowel obstruction on CT performed earlier this same date.  EXAM: ABDOMEN - 2 VIEW  COMPARISON:  CT, 12/10/2012 at 0048 hr  FINDINGS: Dilated small bowel is evident, with a loop of dilated small bowel noted in the left mid abdomen. No colonic dilation is seen. There are bowel anastomosis staples in the right mid to lower abdomen. Air-fluid levels are noted on the decubitus view. There is no free air.  A oval density with a lucent center projects in the left upper pelvis. This likely  reflects 1 of the density seen within dilated small bowel on the CT.  A bone island lies in the right ilium.  A nasogastric tube has its tip in the mid stomach.  IMPRESSION: Small bowel obstruction, with most of the dilated small bowel loops being fluid filled. No convincing change from the earlier CT. No free air.   Electronically Signed   By: Lajean Manes   On: 12/10/2012 10:21    Anti-infectives: Anti-infectives   Start     Dose/Rate Route Frequency Ordered Stop   12/11/12 2300  vancomycin (VANCOCIN) IVPB 750 mg/150 ml premix     750 mg 150 mL/hr over 60 Minutes Intravenous Every 24 hours 12/10/12 2247     12/10/12 2300  vancomycin (VANCOCIN) 1,250 mg in sodium chloride 0.9 % 250 mL IVPB     1,250 mg 166.7 mL/hr over 90 Minutes Intravenous  Once 12/10/12 2247 12/11/12 0049   12/10/12 1800  piperacillin-tazobactam (ZOSYN) IVPB 3.375 g     3.375 g 12.5 mL/hr over 240 Minutes Intravenous Every 8 hours 12/10/12 1341     12/10/12 0600  piperacillin-tazobactam (ZOSYN) IVPB 2.25 g  Status:  Discontinued     2.25 g 100 mL/hr over 30 Minutes Intravenous 4 times per day 12/10/12 0411 12/10/12 1341  Assessment/Plan: ? Resolving sbo at anastomosis  I think he is better today and is moving gas/bms through.  His xrays are somewhat better. Ng is not in good position and will be advanced this am.  I think reasonable to continue conservative therapy and see if improves.  I think he still may need to have this addressed as it is likely a chronic problem.  Might be reasonable to see about dilation if he gets better otherwise will need revision.   Continue ng, npo today, will recheck in am with films.   [facility] 12/11/2012

## 2012-12-12 ENCOUNTER — Inpatient Hospital Stay (HOSPITAL_COMMUNITY): Payer: Medicare Other

## 2012-12-12 DIAGNOSIS — N179 Acute kidney failure, unspecified: Secondary | ICD-10-CM

## 2012-12-12 DIAGNOSIS — K56609 Unspecified intestinal obstruction, unspecified as to partial versus complete obstruction: Secondary | ICD-10-CM

## 2012-12-12 DIAGNOSIS — R578 Other shock: Secondary | ICD-10-CM

## 2012-12-12 DIAGNOSIS — E860 Dehydration: Secondary | ICD-10-CM

## 2012-12-12 LAB — CULTURE, BLOOD (ROUTINE X 2)

## 2012-12-12 LAB — BASIC METABOLIC PANEL
BUN: 13 mg/dL (ref 6–23)
Chloride: 111 mEq/L (ref 96–112)
GFR calc Af Amer: 73 mL/min — ABNORMAL LOW (ref >90)
GFR calc non Af Amer: 63 mL/min — ABNORMAL LOW (ref >90)
Glucose, Bld: 106 mg/dL — ABNORMAL HIGH (ref 70–99)
Potassium: 3.3 mEq/L — ABNORMAL LOW (ref 3.5–5.1)
Sodium: 142 mEq/L (ref 135–145)

## 2012-12-12 LAB — CLOSTRIDIUM DIFFICILE BY PCR: Toxigenic C. Difficile by PCR: NEGATIVE

## 2012-12-12 NOTE — Progress Notes (Signed)
Subjective: Multiple bms, passing flatus, feels well  Objective: Vital signs in last 24 hours: Temp:  [97.5 F (36.4 C)-98.1 F (36.7 C)] 97.5 F (36.4 C) (10/04 0552) Pulse Rate:  [91-97] 95 (10/04 0552) Resp:  [17-22] 20 (10/04 0552) BP: (114-138)/(54-70) 134/68 mmHg (10/04 0552) SpO2:  [92 %-96 %] 95 % (10/04 0552) Weight:  [170 lb (77.111 kg)] 170 lb (77.111 kg) (10/03 1551) Last BM Date: 12/12/12  Intake/Output from previous day: 10/03 0701 - 10/04 0700 In: 2160 [I.V.:1950; NG/GT:60; IV Piggyback:150] Out: 1315 [Urine:965; Emesis/NG output:350] Intake/Output this shift:    General appearance: no distress Resp: clear to auscultation bilaterally Cardio: regular rate and rhythm GI: soft nontender nondistended bs present  Lab Results:   Recent Labs  12/09/12 2150 12/11/12 0420  WBC 4.8 7.5  HGB 15.6 10.8*  HCT 45.8 31.6*  PLT 268 149*   BMET  Recent Labs  12/10/12 1140 12/11/12 0420  NA 145 143  K 4.0 3.8  CL 113* 113*  CO2 17* 22  GLUCOSE 105* 130*  BUN 32* 32*  CREATININE 2.32* 1.86*  CALCIUM 7.1* 7.0*   PT/INR  Recent Labs  12/10/12 0550  LABPROT 14.3  INR 1.13   ABG No results found for this basename: PHART, PCO2, PO2, HCO3,  in the last 72 hours  Studies/Results: Dg Abd 1 View  12/12/2012   CLINICAL DATA:  Followup small bowel obstruction  EXAM: ABDOMEN - 1 VIEW  COMPARISON:  Plain film 12/11/2012  FINDINGS: NG tube extends to the stomach. Single dilated loop of small bowel to 4 cm. . Surgical clips in right lower quadrant. There is gas and stool in the sigmoid colon.  IMPRESSION: No evidence of mechanical small bowel obstruction. Potential ileus or partial obstruction.   Electronically Signed   By: Suzy Bouchard M.D.   On: 12/12/2012 09:11   Dg Abd 2 Views  12/11/2012   *RADIOLOGY REPORT*  Clinical Data: Evaluate small bowel obstruction, subsequent encounter.  ABDOMEN - 2 VIEW  Comparison: 12/10/2012; abdominal CT - 12/09/2012   Findings:  There is persistent moderate gaseous distension of several loops of rather featureless appearing small bowel with index looped within the mid upper abdomen measuring approximately 6.3 cm in diameter. There is a minimal amount of distal colonic gas seen within the splenic flexure and descending colon.  No pneumoperitoneum, pneumatosis or portal venous gas.  Post cholecystectomy.  Enteric staple lines overlies the right mid and lower abdomen. Partially opaque presumed pill fragments overlie the lower abdomen.  Limited visualization of the lower thorax suggests minimal right infrahilar opacities with possible trace bilateral effusions.  Enteric side port overlies the mid/distal esophagus. Central venous catheter tip overlies the superior cavoatrial junction.  No acute osseous abnormality.  IMPRESSION: 1.  Overall findings suggestive of improving but likely persistent partial small bowel obstruction.  2. Malpositioned enteric tube with side port projecting over the mid/distal esophagus.  Advancement is 20 cm is recommended.   Original Report Authenticated By: Jake Seats, MD    Anti-infectives: Anti-infectives   Start     Dose/Rate Route Frequency Ordered Stop   12/11/12 2300  vancomycin (VANCOCIN) IVPB 750 mg/150 ml premix  Status:  Discontinued     750 mg 150 mL/hr over 60 Minutes Intravenous Every 24 hours 12/10/12 2247 12/11/12 1351   12/11/12 2300  vancomycin (VANCOCIN) IVPB 1000 mg/200 mL premix     1,000 mg 200 mL/hr over 60 Minutes Intravenous Every 24 hours 12/11/12 1351  12/10/12 2300  vancomycin (VANCOCIN) 1,250 mg in sodium chloride 0.9 % 250 mL IVPB     1,250 mg 166.7 mL/hr over 90 Minutes Intravenous  Once 12/10/12 2247 12/11/12 0049   12/10/12 1800  piperacillin-tazobactam (ZOSYN) IVPB 3.375 g     3.375 g 12.5 mL/hr over 240 Minutes Intravenous Every 8 hours 12/10/12 1341     12/10/12 0600  piperacillin-tazobactam (ZOSYN) IVPB 2.25 g  Status:  Discontinued     2.25 g 100  mL/hr over 30 Minutes Intravenous 4 times per day 12/10/12 0411 12/10/12 1341      Assessment/Plan: Resolving sbo  I think he is resolving clinically and radiologically.  I removed ng this am, will let him have some sips of clears and can have clears tray for dinner if doing well. Needs to be oob, pulm toilet   Kyeisha Janowicz 12/12/2012

## 2012-12-12 NOTE — Progress Notes (Signed)
[facility] PROGRESS NOTE  Ricardo Villa HYW:[id number] DOB: Jan 11, 1935 DOA: 12/09/2012 PCP: [name], MD  Assessment/Plan: 78 y/o male with Crohn's disease admitted on 10/2 with small bowel obstruction and septic shock.  1-SBO: Patient with PMH Crohn diseases, cholecystectomy, terminal ileum resection, appendectomy.  -Improving clinically. Denies abdominal pain. Had one episode of watery stool overnight. C. diff negative.  -NG tube removed 10-04. Patient started on diet.  -KUB 10-04: No evidence of mechanical small bowel obstruction. Potential ileus or partial obstruction.  2-Severe sepsis : abdominal (? translocation) and possible aspiration. Continue with zosyn to cover for aspiration. Blood culture 1 of 2 staph coagulase negative likely contaminant. Resolved with IV fluids. Lactic acid decrease to 2.2.   3-Aspiration PNA ? ; patient hd an episode of vomiting and worsening hypoxemia. -Clear CXR but R>L basilar infiltrates on CT abdomen.  -Requiring less amount of oxygen. Will continue with Zosyn day 3. Pulmonary toilet.   4-Acute renal failure: due to sepsis/hypovolemia, baseline Cr 1.3-1.5 Cr peak to 2.9, improving with IV fluids. Cr has decrease to 1.8. Continue with IV fluids.   5-Hypovolemic/septic shock - improved after 6 l NS. In setting dehydration, hypovolemia. Resolved.   6-History of Crohn; follow up by Dr Maurene Capes, no acute problems.   7-Staph Coagulase negative 1 of 2 blood culture: probably contaminate. Will discontinue Vancomycin.   Code Status: Full code.  Family Communication: Care discussed with patient.  Disposition Plan: Remain inpatient.    Consultants:  Surgery  CCM transfer care to [facility].   Procedures:  none  Antibiotics:  Vancomycin 10-03  Zosyn 10-02  HPI/Subjective: He denies abdominal pain, nausea, vomiting.  He had episode of watery stool last night. He is passing gas.   Objective: Filed Vitals:   12/12/12 0552  BP: 134/68  Pulse: 95  Temp: 97.5 F (36.4 C)  Resp: 20    Intake/Output Summary (Last 24 hours) at 12/12/12 0839 Last data filed at 12/12/12 0700  Gross per 24 hour  Intake   2005 ml  Output   1245 ml  Net    760 ml   Filed Weights   12/10/12 0435 12/11/12 0500 12/11/12 1551  Weight: 76.7 kg (169 lb 1.5 oz) 76.7 kg (169 lb 1.5 oz) 77.111 kg (170 lb)    Exam:   General:  No distress, NG tube in place.   Cardiovascular: S 1, S 2 RRR  Respiratory: CTA  Abdomen: BS present, soft, NT, no significant distention.   Musculoskeletal: no edema.   Data Reviewed: Basic Metabolic Panel:  Recent Labs Lab 12/09/12 2150 12/10/12 0550 12/10/12 1140 12/11/12 0420  NA 147*  --  145 143  K 3.3*  --  4.0 3.8  CL 105  --  113* 113*  CO2 18*  --  17* 22  GLUCOSE 166*  --  105* 130*  BUN 24*  --  32* 32*  CREATININE 2.90* 2.86* 2.32* 1.86*  CALCIUM 10.5  --  7.1* 7.0*   Liver Function Tests:  Recent Labs Lab 12/09/12 2150 12/11/12 0420  AST 52* 38*  ALT 31 19  ALKPHOS 60 65  BILITOT 2.2* 0.9  PROT 7.3 4.9*  ALBUMIN 3.9 2.1*    Recent Labs Lab 12/09/12 2223  LIPASE 16   No results found for this basename: AMMONIA,  in the last 168 hours CBC:  Recent Labs Lab 12/09/12 2150 12/11/12 0420  WBC 4.8 7.5  NEUTROABS 3.8  --   HGB 15.6 10.8*  HCT  45.8 31.6*  MCV 93.7 93.5  PLT 268 149*   Cardiac Enzymes:  Recent Labs Lab 12/09/12 2223 12/10/12 0550  TROPONINI <0.30 <0.30   BNP (last 3 results) No results found for this basename: PROBNP,  in the last 8760 hours CBG:  Recent Labs Lab 12/10/12 0446  GLUCAP 91    Recent Results (from the past 240 hour(s))  CULTURE, BLOOD (ROUTINE X 2)     Status: None   Collection Time    12/09/12 10:20 PM      Result Value Range Status   Specimen Description BLOOD LEFT WRIST   Final   Special Requests     Final   Value: BOTTLES DRAWN AEROBIC AND ANAEROBIC AER 5cc ANA 5cc   Culture  Setup Time      Final   Value: 12/10/2012 03:56     Performed at [facility]   Culture     Final   Value: GRAM POSITIVE COCCI IN CLUSTERS     Note: Gram Stain Report Called to,Read Back By and Verified With: MANDIE ROLLS ON 12/10/2012 AT 10:05P BY [employer]     Performed at [facility]   Report Status PENDING   Incomplete  CULTURE, BLOOD (ROUTINE X 2)     Status: None   Collection Time    12/09/12 10:30 PM      Result Value Range Status   Specimen Description BLOOD RIGHT WRIST   Final   Special Requests     Final   Value: BOTTLES DRAWN AEROBIC AND ANAEROBIC AER 3cc ANA 3cc   Culture  Setup Time     Final   Value: 12/10/2012 03:57     Performed at [facility]   Culture     Final   Value:        BLOOD CULTURE RECEIVED NO GROWTH TO DATE CULTURE WILL BE HELD FOR 5 DAYS BEFORE ISSUING A FINAL NEGATIVE REPORT     Performed at [facility]   Report Status PENDING   Incomplete  MRSA PCR SCREENING     Status: None   Collection Time    12/10/12  6:07 AM      Result Value Range Status   MRSA by PCR NEGATIVE  NEGATIVE Final   Comment:            The GeneXpert MRSA Assay (FDA     approved for NASAL specimens     only), is one component of a     comprehensive MRSA colonization     surveillance program. It is not     intended to diagnose MRSA     infection nor to guide or     monitor treatment for     MRSA infections.  URINE CULTURE     Status: None   Collection Time    12/10/12  6:08 AM      Result Value Range Status   Specimen Description URINE, CATHETERIZED   Final   Special Requests Normal   Final   Culture  Setup Time     Final   Value: 12/10/2012 11:46     Performed at [place]     Final   Value: NO GROWTH     Performed at [facility]   Culture     Final   Value: NO GROWTH     Performed at [facility]   Report Status 12/11/2012 FINAL   Final  Studies: Dg Abd 2 Views  12/11/2012   *RADIOLOGY REPORT*   Clinical Data: Evaluate small bowel obstruction, subsequent encounter.  ABDOMEN - 2 VIEW  Comparison: 12/10/2012; abdominal CT - 12/09/2012  Findings:  There is persistent moderate gaseous distension of several loops of rather featureless appearing small bowel with index looped within the mid upper abdomen measuring approximately 6.3 cm in diameter. There is a minimal amount of distal colonic gas seen within the splenic flexure and descending colon.  No pneumoperitoneum, pneumatosis or portal venous gas.  Post cholecystectomy.  Enteric staple lines overlies the right mid and lower abdomen. Partially opaque presumed pill fragments overlie the lower abdomen.  Limited visualization of the lower thorax suggests minimal right infrahilar opacities with possible trace bilateral effusions.  Enteric side port overlies the mid/distal esophagus. Central venous catheter tip overlies the superior cavoatrial junction.  No acute osseous abnormality.  IMPRESSION: 1.  Overall findings suggestive of improving but likely persistent partial small bowel obstruction.  2. Malpositioned enteric tube with side port projecting over the mid/distal esophagus.  Advancement is 20 cm is recommended.   Original Report Authenticated By: Jake Seats, MD   Dg Abd 2 Views  12/10/2012   CLINICAL DATA:  Nausea and vomiting yesterday. Small bowel obstruction on CT performed earlier this same date.  EXAM: ABDOMEN - 2 VIEW  COMPARISON:  CT, 12/10/2012 at 0048 hr  FINDINGS: Dilated small bowel is evident, with a loop of dilated small bowel noted in the left mid abdomen. No colonic dilation is seen. There are bowel anastomosis staples in the right mid to lower abdomen. Air-fluid levels are noted on the decubitus view. There is no free air.  A oval density with a lucent center projects in the left upper pelvis. This likely reflects 1 of the density seen within dilated small bowel on the CT.  A bone island lies in the right ilium.  A nasogastric tube has  its tip in the mid stomach.  IMPRESSION: Small bowel obstruction, with most of the dilated small bowel loops being fluid filled. No convincing change from the earlier CT. No free air.   Electronically Signed   By: Lajean Manes   On: 12/10/2012 10:21    Scheduled Meds: . pantoprazole (PROTONIX) IV  40 mg Intravenous QHS  . piperacillin-tazobactam (ZOSYN)  IV  3.375 g Intravenous Q8H  . vancomycin  1,000 mg Intravenous Q24H   Continuous Infusions: . dextrose 5 % and 0.45% NaCl 75 mL/hr (12/12/12 0221)    Principal Problem:   SBO (small bowel obstruction) Active Problems:   CROHN'S DISEASE   Septic shock(785.52)   AKI (acute kidney injury)    Time spent: 25 minutes.     REGALADO,BELKYS  [facility] Pager [phone]. If 7PM-7AM, please contact night-coverage at [email or web address], password [facility] 12/12/2012, 8:39 AM  LOS: 3 days

## 2012-12-13 LAB — BASIC METABOLIC PANEL
BUN: 10 mg/dL (ref 6–23)
CO2: 24 mEq/L (ref 19–32)
Chloride: 109 mEq/L (ref 96–112)
GFR calc non Af Amer: 61 mL/min — ABNORMAL LOW (ref >90)
Glucose, Bld: 107 mg/dL — ABNORMAL HIGH (ref 70–99)
Potassium: 2.8 mEq/L — ABNORMAL LOW (ref 3.5–5.1)
Sodium: 141 mEq/L (ref 135–145)

## 2012-12-13 LAB — CBC
HCT: 29.4 % — ABNORMAL LOW (ref 39.0–52.0)
Hemoglobin: 10.1 g/dL — ABNORMAL LOW (ref 13.0–17.0)
MCHC: 34.4 g/dL (ref 30.0–36.0)
Platelets: 109 10*3/uL — ABNORMAL LOW (ref 150–400)
RBC: 3.23 MIL/uL — ABNORMAL LOW (ref 4.22–5.81)
WBC: 5.8 10*3/uL (ref 4.0–10.5)

## 2012-12-13 MED ORDER — POTASSIUM CHLORIDE CRYS ER 20 MEQ PO TBCR
40.0000 meq | EXTENDED_RELEASE_TABLET | ORAL | Status: AC
  Administered 2012-12-13 (×2): 40 meq via ORAL
  Filled 2012-12-13 (×3): qty 2

## 2012-12-13 MED ORDER — ALBUTEROL SULFATE (5 MG/ML) 0.5% IN NEBU: 2.5000 mg | INHALATION_SOLUTION | Freq: Four times a day (QID) | RESPIRATORY_TRACT | Status: DC | PRN

## 2012-12-13 MED ORDER — POTASSIUM CHLORIDE 10 MEQ/100ML IV SOLN
10.0000 meq | INTRAVENOUS | Status: AC
  Administered 2012-12-13 (×4): 10 meq via INTRAVENOUS
  Filled 2012-12-13 (×4): qty 100

## 2012-12-13 NOTE — Progress Notes (Addendum)
[facility] PROGRESS NOTE  Ricardo Villa YIA:[id number] DOB: January 09, 1935 DOA: 12/09/2012 PCP: Ricardo Heck, MD  Assessment/Plan: 78 y/o male with Crohn's disease admitted on 10/2 with small bowel obstruction and septic shock.  1-SBO: Patient with PMH of Crohn diseases, cholecystectomy, terminal ileum resection, appendectomy.  -tolerating clear diet. Had 2 watery stool last night. C diff negative.  -NG tube removed 10-04.  -KUB 10-04: No evidence of mechanical small bowel obstruction. Potential ileus or partial obstruction.  2-Severe sepsis : Resolved.  abdominal (? translocation) and possible aspiration. Continue with zosyn to cover for aspiration. Blood culture 1 of 2 staph coagulase negative likely contaminant. Resolved with IV fluids. Lactic acid decrease to 2.2.   3-Aspiration PNA ?:  patient hd an episode of vomiting and worsening hypoxemia. -Clear CXR but R>L basilar infiltrates on CT abdomen.  -Requiring less amount of oxygen. Will continue with Zosyn day 4. Pulmonary toilet. Add albuterol had sporadic wheezes. Will transition to oral Augmentin 10-06.   4-Acute renal failure: due to sepsis/hypovolemia, baseline Cr 1.3-1.5 Cr peak to 2.9, improving with IV fluids. Cr has decrease to 1.1.  -Will NSL. Tolerating diet.   5-Hypovolemic/septic shock - improved after 6 l NS. In setting dehydration, hypovolemia. Resolved.   6-History of Crohn; follow up by Dr Maurene Capes, no acute problems.   7-Staph Coagulase negative 1 of 2 blood culture: probably contaminate. Will discontinue Vancomycin.  8-Hypokalemia: replete with 3 runs IV times 4, 40 meq time 2 doses.   Code Status: Full code.  Family Communication: Care discussed with patient.  Disposition Plan: Remain inpatient.    Consultants:  Surgery  CCM transfer care to [facility].   Procedures:  none  Antibiotics:  Vancomycin 10-03  Zosyn 10-02  HPI/Subjective: Feeling better. Tolerating clear diet. No  nausea, abdominal pain. No worsening dyspnea.   Objective: Filed Vitals:   12/13/12 0647  BP: 106/49  Pulse: 59  Temp: 97.5 F (36.4 C)  Resp: 20    Intake/Output Summary (Last 24 hours) at 12/13/12 1103 Last data filed at 12/13/12 1036  Gross per 24 hour  Intake    480 ml  Output   1350 ml  Net   -870 ml   Filed Weights   12/10/12 0435 12/11/12 0500 12/11/12 1551  Weight: 76.7 kg (169 lb 1.5 oz) 76.7 kg (169 lb 1.5 oz) 77.111 kg (170 lb)    Exam:   General:  No distress, NG tube in place.   Cardiovascular: S 1, S 2 RRR  Respiratory: CTA  Abdomen: BS present, soft, NT, no significant distention.   Musculoskeletal: no edema.   Data Reviewed: Basic Metabolic Panel:  Recent Labs Lab 12/09/12 2150 12/10/12 0550 12/10/12 1140 12/11/12 0420 12/12/12 1555 12/13/12 0615  NA 147*  --  145 143 142 141  K 3.3*  --  4.0 3.8 3.3* 2.8*  CL 105  --  113* 113* 111 109  CO2 18*  --  17* 22 23 24   GLUCOSE 166*  --  105* 130* 106* 107*  BUN 24*  --  32* 32* 13 10  CREATININE 2.90* 2.86* 2.32* 1.86* 1.11 1.13  CALCIUM 10.5  --  7.1* 7.0* 7.3* 7.6*   Liver Function Tests:  Recent Labs Lab 12/09/12 2150 12/11/12 0420  AST 52* 38*  ALT 31 19  ALKPHOS 60 65  BILITOT 2.2* 0.9  PROT 7.3 4.9*  ALBUMIN 3.9 2.1*    Recent Labs Lab 12/09/12 2223  LIPASE 16   No results  found for this basename: AMMONIA,  in the last 168 hours CBC:  Recent Labs Lab 12/09/12 2150 12/11/12 0420 12/13/12 0615  WBC 4.8 7.5 5.8  NEUTROABS 3.8  --   --   HGB 15.6 10.8* 10.1*  HCT 45.8 31.6* 29.4*  MCV 93.7 93.5 91.0  PLT 268 149* 109*   Cardiac Enzymes:  Recent Labs Lab 12/09/12 2223 12/10/12 0550  TROPONINI <0.30 <0.30   BNP (last 3 results) No results found for this basename: PROBNP,  in the last 8760 hours CBG:  Recent Labs Lab 12/10/12 0446  GLUCAP 91    Recent Results (from the past 240 hour(s))  CULTURE, BLOOD (ROUTINE X 2)     Status: None   Collection  Time    12/09/12 10:20 PM      Result Value Range Status   Specimen Description BLOOD LEFT WRIST   Final   Special Requests     Final   Value: BOTTLES DRAWN AEROBIC AND ANAEROBIC AER 5cc ANA 5cc   Culture  Setup Time     Final   Value: 12/10/2012 03:56     Performed at [facility]   Culture     Final   Value: STAPHYLOCOCCUS SPECIES (COAGULASE NEGATIVE)     Note: THE SIGNIFICANCE OF ISOLATING THIS ORGANISM FROM A SINGLE SET OF BLOOD CULTURES WHEN MULTIPLE SETS ARE DRAWN IS UNCERTAIN. PLEASE NOTIFY THE MICROBIOLOGY DEPARTMENT WITHIN ONE WEEK IF SPECIATION AND SENSITIVITIES ARE REQUIRED.     Note: Gram Stain Report Called to,Read Back By and Verified With: MANDIE ROLLS ON 12/10/2012 AT 10:05P BY WILEJ     Performed at [facility]   Report Status 12/12/2012 FINAL   Final  CULTURE, BLOOD (ROUTINE X 2)     Status: None   Collection Time    12/09/12 10:30 PM      Result Value Range Status   Specimen Description BLOOD RIGHT WRIST   Final   Special Requests     Final   Value: BOTTLES DRAWN AEROBIC AND ANAEROBIC AER 3cc ANA 3cc   Culture  Setup Time     Final   Value: 12/10/2012 03:57     Performed at [facility]   Culture     Final   Value:        BLOOD CULTURE RECEIVED NO GROWTH TO DATE CULTURE WILL BE HELD FOR 5 DAYS BEFORE ISSUING A FINAL NEGATIVE REPORT     Performed at [facility]   Report Status PENDING   Incomplete  MRSA PCR SCREENING     Status: None   Collection Time    12/10/12  6:07 AM      Result Value Range Status   MRSA by PCR NEGATIVE  NEGATIVE Final   Comment:            The GeneXpert MRSA Assay (FDA     approved for NASAL specimens     only), is one component of a     comprehensive MRSA colonization     surveillance program. It is not     intended to diagnose MRSA     infection nor to guide or     monitor treatment for     MRSA infections.  URINE CULTURE     Status: None   Collection Time    12/10/12  6:08 AM      Result Value  Range Status   Specimen Description URINE, CATHETERIZED   Final   Special  Requests Normal   Final   Culture  Setup Time     Final   Value: 12/10/2012 11:46     Performed at [place]     Final   Value: NO GROWTH     Performed at [facility]   Culture     Final   Value: NO GROWTH     Performed at [facility]   Report Status 12/11/2012 FINAL   Final  CLOSTRIDIUM DIFFICILE BY PCR     Status: None   Collection Time    12/11/12 11:38 PM      Result Value Range Status   C difficile by pcr NEGATIVE  NEGATIVE Final     Studies: Dg Abd 1 View  12/12/2012   CLINICAL DATA:  Followup small bowel obstruction  EXAM: ABDOMEN - 1 VIEW  COMPARISON:  Plain film 12/11/2012  FINDINGS: NG tube extends to the stomach. Single dilated loop of small bowel to 4 cm. . Surgical clips in right lower quadrant. There is gas and stool in the sigmoid colon.  IMPRESSION: No evidence of mechanical small bowel obstruction. Potential ileus or partial obstruction.   Electronically Signed   By: Suzy Bouchard M.D.   On: 12/12/2012 09:11    Scheduled Meds: . pantoprazole (PROTONIX) IV  40 mg Intravenous QHS  . piperacillin-tazobactam (ZOSYN)  IV  3.375 g Intravenous Q8H  . potassium chloride  10 mEq Intravenous Q1 Hr x 4  . potassium chloride  40 mEq Oral Q4H   Continuous Infusions:    Principal Problem:   SBO (small bowel obstruction) Active Problems:   CROHN'S DISEASE   Septic shock(785.52)   AKI (acute kidney injury)    Time spent: 25 minutes.     Kinza Gouveia  [facility] Pager [phone]. If 7PM-7AM, please contact night-coverage at [email or web address], password [facility] 12/13/2012, 11:03 AM  LOS: 4 days    Thrombocytopenia/ anemia: anemia could be anemia critical illness. repeat Hb in am. Guaiac stool. Patient received heparin, will ask pharmacy to send HIT panel. Thrombocytopenia could be secondary to sepsis.

## 2012-12-13 NOTE — Progress Notes (Signed)
  Subjective: Feels well no pain bowels moving Objective: Vital signs in last 24 hours: Temp:  [97.5 F (36.4 C)-98.2 F (36.8 C)] 97.5 F (36.4 C) (10/05 0647) Pulse Rate:  [59-95] 59 (10/05 0647) Resp:  [17-20] 20 (10/05 0647) BP: (106-127)/(49-62) 106/49 mmHg (10/05 0647) SpO2:  [94 %-97 %] 95 % (10/05 0647) Last BM Date: 12/12/12  Intake/Output from previous day: 10/04 0701 - 10/05 0700 In: 240 [P.O.:240] Out: 1350 [Urine:850; Stool:500] Intake/Output this shift: Total I/O In: 240 [P.O.:240] Out: -   GI: soft ND nt scars noted  Lab Results:   Recent Labs  12/11/12 0420 12/13/12 0615  WBC 7.5 5.8  HGB 10.8* 10.1*  HCT 31.6* 29.4*  PLT 149* 109*   BMET  Recent Labs  12/12/12 1555 12/13/12 0615  NA 142 141  K 3.3* 2.8*  CL 111 109  CO2 23 24  GLUCOSE 106* 107*  BUN 13 10  CREATININE 1.11 1.13  CALCIUM 7.3* 7.6*   PT/INR No results found for this basename: LABPROT, INR,  in the last 72 hours ABG No results found for this basename: PHART, PCO2, PO2, HCO3,  in the last 72 hours  Studies/Results: Dg Abd 1 View  12/12/2012   CLINICAL DATA:  Followup small bowel obstruction  EXAM: ABDOMEN - 1 VIEW  COMPARISON:  Plain film 12/11/2012  FINDINGS: NG tube extends to the stomach. Single dilated loop of small bowel to 4 cm. . Surgical clips in right lower quadrant. There is gas and stool in the sigmoid colon.  IMPRESSION: No evidence of mechanical small bowel obstruction. Potential ileus or partial obstruction.   Electronically Signed   By: Suzy Bouchard M.D.   On: 12/12/2012 09:11    Anti-infectives: Anti-infectives   Start     Dose/Rate Route Frequency Ordered Stop   12/11/12 2300  vancomycin (VANCOCIN) IVPB 750 mg/150 ml premix  Status:  Discontinued     750 mg 150 mL/hr over 60 Minutes Intravenous Every 24 hours 12/10/12 2247 12/11/12 1351   12/11/12 2300  vancomycin (VANCOCIN) IVPB 1000 mg/200 mL premix  Status:  Discontinued     1,000 mg 200 mL/hr  over 60 Minutes Intravenous Every 24 hours 12/11/12 1351 12/12/12 1400   12/10/12 2300  vancomycin (VANCOCIN) 1,250 mg in sodium chloride 0.9 % 250 mL IVPB     1,250 mg 166.7 mL/hr over 90 Minutes Intravenous  Once 12/10/12 2247 12/11/12 0049   12/10/12 1800  piperacillin-tazobactam (ZOSYN) IVPB 3.375 g     3.375 g 12.5 mL/hr over 240 Minutes Intravenous Every 8 hours 12/10/12 1341     12/10/12 0600  piperacillin-tazobactam (ZOSYN) IVPB 2.25 g  Status:  Discontinued     2.25 g 100 mL/hr over 30 Minutes Intravenous 4 times per day 12/10/12 0411 12/10/12 1341      Assessment/Plan: s LOS: 4 days  pSBO resolving Adv diet Can go home Monday if he tolerates diet  Jennifer Holland A. 12/13/2012

## 2012-12-13 NOTE — Progress Notes (Signed)
ANTIBIOTIC CONSULT NOTE - FOLLOW UP  Pharmacy Consult:  Zosyn Indication:  Intra-abdominal infection  No Known Allergies  Patient Measurements: Height: 5' 11"  (180.3 cm) Weight: 170 lb (77.111 kg) IBW/kg (Calculated) : 75.3  Vital Signs: Temp: 97.5 F (36.4 C) (10/05 0647) Temp src: Oral (10/05 0647) BP: 106/49 mmHg (10/05 0647) Pulse Rate: 59 (10/05 0647) Intake/Output from previous day: 10/04 0701 - 10/05 0700 In: 240 [P.O.:240] Out: 1350 [Urine:850; Stool:500] Intake/Output from this shift: Total I/O In: 240 [P.O.:240] Out: -   Labs:  Recent Labs  12/11/12 0420 12/12/12 1555 12/13/12 0615  WBC 7.5  --  5.8  HGB 10.8*  --  10.1*  PLT 149*  --  109*  CREATININE 1.86* 1.11 1.13   Estimated Creatinine Clearance: 59.2 ml/min (by C-G formula based on Cr of 1.13). No results found for this basename: VANCOTROUGH, VANCOPEAK, VANCORANDOM, [place], GENTPEAK, GENTRANDOM, [place], TOBRAPEAK, TOBRARND, AMIKACINPEAK, AMIKACINTROU, AMIKACIN,  in the last 72 hours   Microbiology: Recent Results (from the past 720 hour(s))  CULTURE, BLOOD (ROUTINE X 2)     Status: None   Collection Time    12/09/12 10:20 PM      Result Value Range Status   Specimen Description BLOOD LEFT WRIST   Final   Special Requests     Final   Value: BOTTLES DRAWN AEROBIC AND ANAEROBIC AER 5cc ANA 5cc   Culture  Setup Time     Final   Value: 12/10/2012 03:56     Performed at [facility]   Culture     Final   Value: STAPHYLOCOCCUS SPECIES (COAGULASE NEGATIVE)     Note: THE SIGNIFICANCE OF ISOLATING THIS ORGANISM FROM A SINGLE SET OF BLOOD CULTURES WHEN MULTIPLE SETS ARE DRAWN IS UNCERTAIN. PLEASE NOTIFY THE MICROBIOLOGY DEPARTMENT WITHIN ONE WEEK IF SPECIATION AND SENSITIVITIES ARE REQUIRED.     Note: Gram Stain Report Called to,Read Back By and Verified With: MANDIE ROLLS ON 12/10/2012 AT 10:05P BY WILEJ     Performed at [facility]   Report Status 12/12/2012 FINAL   Final   CULTURE, BLOOD (ROUTINE X 2)     Status: None   Collection Time    12/09/12 10:30 PM      Result Value Range Status   Specimen Description BLOOD RIGHT WRIST   Final   Special Requests     Final   Value: BOTTLES DRAWN AEROBIC AND ANAEROBIC AER 3cc ANA 3cc   Culture  Setup Time     Final   Value: 12/10/2012 03:57     Performed at [facility]   Culture     Final   Value:        BLOOD CULTURE RECEIVED NO GROWTH TO DATE CULTURE WILL BE HELD FOR 5 DAYS BEFORE ISSUING A FINAL NEGATIVE REPORT     Performed at [facility]   Report Status PENDING   Incomplete  MRSA PCR SCREENING     Status: None   Collection Time    12/10/12  6:07 AM      Result Value Range Status   MRSA by PCR NEGATIVE  NEGATIVE Final   Comment:            The GeneXpert MRSA Assay (FDA     approved for NASAL specimens     only), is one component of a     comprehensive MRSA colonization     surveillance program. It is not     intended to diagnose  MRSA     infection nor to guide or     monitor treatment for     MRSA infections.  URINE CULTURE     Status: None   Collection Time    12/10/12  6:08 AM      Result Value Range Status   Specimen Description URINE, CATHETERIZED   Final   Special Requests Normal   Final   Culture  Setup Time     Final   Value: 12/10/2012 11:46     Performed at [place]     Final   Value: NO GROWTH     Performed at [facility]   Culture     Final   Value: NO GROWTH     Performed at [facility]   Report Status 12/11/2012 FINAL   Final  CLOSTRIDIUM DIFFICILE BY PCR     Status: None   Collection Time    12/11/12 11:38 PM      Result Value Range Status   C difficile by pcr NEGATIVE  NEGATIVE Final      Assessment: 22 YOM continues on Zosyn for possible aspiration PNA.  Patient's renal function is improving.  Zosyn 10/2 >> Vanc 10/2 >> 10/4  10/1 BCx2 - CoNS (1 of 2, likely contaminant) 10/2 UCx - negative 10/3  C.diff - negative   Goal of Therapy:  Appropriate dosing   Plan:  - Continue Zosyn 3.375gm IV Q8H, 4 hr infusion - Monitor renal fxn, clinical course, abx LOT - Change PPI to PO once intake improves - Watch hgb and plts    Seriyah Collison D. Mina Marble, PharmD, BCPS Pager:  [phone] 12/13/2012, 10:12 AM

## 2012-12-13 NOTE — Evaluation (Addendum)
Physical Therapy Evaluation Patient Details Name: Ricardo Villa MRN: [id number] DOB: 1934/09/06 Today's Date: 12/13/2012 Time: 8372-9021 PT Time Calculation (min): 18 min  PT Assessment / Plan / Recommendation History of Present Illness  78 y/o male with Crohn's disease admitted on 10/2 with small bowel obstruction and septic shock.  Clinical Impression  Pt admitted with the above. Pt currently with functional limitations due to the deficits listed below (see PT Problem List). Pt very motivated and will to work.  Plan to attempt cane vs RW next session for balance.  Pt has cane at home.  Pt will benefit from skilled PT to increase their independence and safety with mobility to allow discharge to the venue listed below.      PT Assessment  Patient needs continued PT services    Follow Up Recommendations  Home health PT;Supervision - Intermittent    Equipment Recommendations  None recommended by PT    Frequency Min 3X/week    Precautions / Restrictions Precautions Precautions: None Restrictions Weight Bearing Restrictions: No   Pertinent Vitals/Pain No c/o pain      Mobility  Bed Mobility Bed Mobility: Sit to Supine Sit to Supine: 4: Min guard;HOB flat Details for Bed Mobility Assistance: Minguard for safety Transfers Transfers: Sit to Stand;Stand to Sit Sit to Stand: 4: Min guard;From chair/3-in-1 Stand to Sit: 4: Min guard;To bed Details for Transfer Assistance: cues for hand placement Ambulation/Gait Ambulation/Gait Assistance: 4: Min guard;4: Min assist Ambulation Distance (Feet): 100 Feet Assistive device: 1 person hand held assist Ambulation/Gait Assistance Details: Occasional min (A) for balance and HHA needed to maintain balance.  Recommend SPC next session.  Gait Pattern: Step-through pattern;Decreased stride length;Shuffle Gait velocity: decreased General Gait Details: Slightly flexed posture during ambulation . Stairs: No    Exercises     PT  Diagnosis: Difficulty walking  PT Problem List: Decreased activity tolerance;Decreased balance;Decreased mobility;Decreased knowledge of use of DME;Decreased knowledge of precautions PT Treatment Interventions: DME instruction;Gait training;Stair training;Functional mobility training;Therapeutic activities;Therapeutic exercise;Balance training;Patient/family education     PT Goals(Current goals can be found in the care plan section) Acute Rehab PT Goals Patient Stated Goal: To go home and move better PT Goal Formulation: With patient/family Time For Goal Achievement: 12/20/12 Potential to Achieve Goals: Good  Visit Information  Last PT Received On: 12/13/12 Assistance Needed: +1 History of Present Illness: 79 y/o male with Crohn's disease admitted on 10/2 with small bowel obstruction and septic shock.       Prior [place] expects to be discharged to:: Private residence Living Arrangements: Spouse/significant other Available Help at Discharge: Family Type of Home: House Home Access: Stairs to enter Technical brewer of Steps: 3 Entrance Stairs-Rails: None Home Layout: One level [place] - single point;Shower seat Prior Function Level of Independence: Independent Communication Communication: No difficulties Dominant Hand: Right    Cognition  Cognition Arousal/Alertness: Awake/alert Behavior During Therapy: WFL for tasks assessed/performed Overall Cognitive Status: Within Functional Limits for tasks assessed    Extremity/Trunk Assessment Lower Extremity Assessment Lower Extremity Assessment: Overall WFL for tasks assessed   Balance    End of Session PT - End of Session Equipment Utilized During Treatment: Gait belt Activity Tolerance: Patient tolerated treatment well Patient left: in bed;with call bell/phone within reach;with family/visitor present Nurse Communication: Mobility status  GP     Warrick Llera 12/13/2012,  4:35 PM  Antoine Poche, [place] DPT [phone]

## 2012-12-14 DIAGNOSIS — A419 Sepsis, unspecified organism: Secondary | ICD-10-CM

## 2012-12-14 LAB — BASIC METABOLIC PANEL
CO2: 22 mEq/L (ref 19–32)
Chloride: 111 mEq/L (ref 96–112)
Creatinine, Ser: 1.12 mg/dL (ref 0.50–1.35)
GFR calc Af Amer: 72 mL/min — ABNORMAL LOW (ref >90)
Glucose, Bld: 98 mg/dL (ref 70–99)
Potassium: 3.4 mEq/L — ABNORMAL LOW (ref 3.5–5.1)

## 2012-12-14 LAB — CBC
HCT: 31.3 % — ABNORMAL LOW (ref 39.0–52.0)
Hemoglobin: 11.1 g/dL — ABNORMAL LOW (ref 13.0–17.0)
MCV: 90.2 fL (ref 78.0–100.0)
Platelets: 129 10*3/uL — ABNORMAL LOW (ref 150–400)
RBC: 3.47 MIL/uL — ABNORMAL LOW (ref 4.22–5.81)
RDW: 14.6 % (ref 11.5–15.5)
WBC: 6.5 10*3/uL (ref 4.0–10.5)

## 2012-12-14 LAB — OCCULT BLOOD X 1 CARD TO LAB, STOOL: Fecal Occult Bld: NEGATIVE

## 2012-12-14 MED ORDER — POTASSIUM CHLORIDE CRYS ER 20 MEQ PO TBCR
40.0000 meq | EXTENDED_RELEASE_TABLET | Freq: Once | ORAL | Status: AC
  Administered 2012-12-14: 40 meq via ORAL
  Filled 2012-12-14: qty 2

## 2012-12-14 MED ORDER — ACETAMINOPHEN 325 MG PO TABS
650.0000 mg | ORAL_TABLET | ORAL | Status: DC | PRN
  Administered 2012-12-14 – 2012-12-15 (×2): 650 mg via ORAL
  Filled 2012-12-14: qty 2

## 2012-12-14 NOTE — Progress Notes (Signed)
Advancing to low residue diet. GI may consider endoscopic dilatation of the strictured area after D/C. I also spoke to his wife and answered her questions. Patient examined and I agree with the assessment and plan  Georganna Skeans, MD, MPH, FACS Pager: [phone]  12/14/2012 10:15 AM

## 2012-12-14 NOTE — Progress Notes (Signed)
[facility] PROGRESS NOTE  Ricardo Villa ZPH:[id number] DOB: 04-26-1934 DOA: 12/09/2012 PCP: [name], MD  Assessment/Plan: 78 y/o male with Crohn's disease admitted on 10/2 with small bowel obstruction and septic shock.  1-SBO: Patient with PMH of Crohn diseases, cholecystectomy, terminal ileum resection, appendectomy.  -tolerating clear diet. Had 2 watery stool last night. C diff negative.  -NG tube removed 10-04.  -KUB 10-04: No evidence of mechanical small bowel obstruction. Potential ileus or partial obstruction. -patient tolerated full liquid diet. Started on regular diet.   2-Severe sepsis : Resolved.  abdominal (? translocation) and possible aspiration. Continue with zosyn to cover for aspiration. Blood culture 1 of 2 staph coagulase negative likely contaminant. Resolved with IV fluids. Lactic acid decrease to 2.2.   3-Aspiration PNA ?:  patient hd an episode of vomiting and worsening hypoxemia. -Clear CXR but R>L basilar infiltrates on CT abdomen.  -Requiring less amount of oxygen. Will continue with Zosyn day 5. Pulmonary toilet. Add albuterol had sporadic wheezes.   4-Acute renal failure: due to sepsis/hypovolemia, baseline Cr 1.3-1.5 Cr peak to 2.9, improving with IV fluids. Cr has decrease to 1.1.  -Will NSL. Tolerating diet.   5-Hypovolemic/septic shock - improved after 6 l NS. In setting dehydration, hypovolemia. Resolved.   6-History of Crohn; follow up by Dr Maurene Capes.   7-Staph Coagulase negative 1 of 2 blood culture: probably contaminate.  Vancomycin discontinue.   8-Hypokalemia: will order 40 meq time one.  9-Gram Negative rod 1 of 2 blood culture: Continue with Zosyn, waiting final culture results. Repeat Blood culture 10-05.  10-Thrombocytopenia/ anemia: anemia could be anemia critical illness.  Guaiac stool pending. HB stable. . Patient received heparin. HIT panel pending. Less likely HIT. Platelet increasing. Thrombocytopenia could be  secondary to sepsis.   Code Status: Full code.  Family Communication: Care discussed with patient.  Disposition Plan: Remain inpatient. Home when we get final blood culture results.    Consultants:  Surgery  CCM transfer care to [facility] 10[facility]04.   Procedures:  none  Antibiotics:  Vancomycin 10-03  Zosyn 10-02  HPI/Subjective: Feeling better. Started today on regular diet.   Objective: Filed Vitals:   12/14/12 0548  BP: 133/84  Pulse: 74  Temp: 97.5 F (36.4 C)  Resp: 20    Intake/Output Summary (Last 24 hours) at 12/14/12 1409 Last data filed at 12/14/12 0550  Gross per 24 hour  Intake    480 ml  Output   1112 ml  Net   -632 ml   Filed Weights   12/10/12 0435 12/11/12 0500 12/11/12 1551  Weight: 76.7 kg (169 lb 1.5 oz) 76.7 kg (169 lb 1.5 oz) 77.111 kg (170 lb)    Exam:   General:  No distress.   Cardiovascular: S 1, S 2 RRR  Respiratory: CTA  Abdomen: BS present, soft, NT, no significant distention.   Musculoskeletal: no edema.   Data Reviewed: Basic Metabolic Panel:  Recent Labs Lab 12/10/12 1140 12/11/12 0420 12/12/12 1555 12/13/12 0615 12/14/12 0625  NA 145 143 142 141 142  K 4.0 3.8 3.3* 2.8* 3.4*  CL 113* 113* 111 109 111  CO2 17* 22 23 24 22   GLUCOSE 105* 130* 106* 107* 98  BUN 32* 32* 13 10 8   CREATININE 2.32* 1.86* 1.11 1.13 1.12  CALCIUM 7.1* 7.0* 7.3* 7.6* 8.2*   Liver Function Tests:  Recent Labs Lab 12/09/12 2150 12/11/12 0420  AST 52* 38*  ALT 31 19  ALKPHOS 60 65  BILITOT 2.2*  0.9  PROT 7.3 4.9*  ALBUMIN 3.9 2.1*    Recent Labs Lab 12/09/12 2223  LIPASE 16   No results found for this basename: AMMONIA,  in the last 168 hours CBC:  Recent Labs Lab 12/09/12 2150 12/11/12 0420 12/13/12 0615 12/14/12 0625  WBC 4.8 7.5 5.8 6.5  NEUTROABS 3.8  --   --   --   HGB 15.6 10.8* 10.1* 11.1*  HCT 45.8 31.6* 29.4* 31.3*  MCV 93.7 93.5 91.0 90.2  PLT 268 149* 109* 129*   Cardiac Enzymes:  Recent Labs Lab  12/09/12 2223 12/10/12 0550  TROPONINI <0.30 <0.30   BNP (last 3 results) No results found for this basename: PROBNP,  in the last 8760 hours CBG:  Recent Labs Lab 12/10/12 0446  GLUCAP 91    Recent Results (from the past 240 hour(s))  CULTURE, BLOOD (ROUTINE X 2)     Status: None   Collection Time    12/09/12 10:20 PM      Result Value Range Status   Specimen Description BLOOD LEFT WRIST   Final   Special Requests     Final   Value: BOTTLES DRAWN AEROBIC AND ANAEROBIC AER 5cc ANA 5cc   Culture  Setup Time     Final   Value: 12/10/2012 03:56     Performed at [facility]   Culture     Final   Value: STAPHYLOCOCCUS SPECIES (COAGULASE NEGATIVE)     Note: THE SIGNIFICANCE OF ISOLATING THIS ORGANISM FROM A SINGLE SET OF BLOOD CULTURES WHEN MULTIPLE SETS ARE DRAWN IS UNCERTAIN. PLEASE NOTIFY THE MICROBIOLOGY DEPARTMENT WITHIN ONE WEEK IF SPECIATION AND SENSITIVITIES ARE REQUIRED.     Note: Gram Stain Report Called to,Read Back By and Verified With: MANDIE ROLLS ON 12/10/2012 AT 10:05P BY WILEJ     Performed at [facility]   Report Status 12/12/2012 FINAL   Final  CULTURE, BLOOD (ROUTINE X 2)     Status: None   Collection Time    12/09/12 10:30 PM      Result Value Range Status   Specimen Description BLOOD RIGHT WRIST   Final   Special Requests     Final   Value: BOTTLES DRAWN AEROBIC AND ANAEROBIC AER 3cc ANA 3cc   Culture  Setup Time     Final   Value: 12/10/2012 03:57     Performed at [facility]   Culture     Final   Value: GRAM NEGATIVE RODS     Note: Gram Stain Report Called to,Read Back By and Verified With: LAURA TURNER 12/13/12 @ 11:28AM BY RUSCOE A.     Performed at [facility]   Report Status PENDING   Incomplete  MRSA PCR SCREENING     Status: None   Collection Time    12/10/12  6:07 AM      Result Value Range Status   MRSA by PCR NEGATIVE  NEGATIVE Final   Comment:            The GeneXpert MRSA Assay (FDA     approved for  NASAL specimens     only), is one component of a     comprehensive MRSA colonization     surveillance program. It is not     intended to diagnose MRSA     infection nor to guide or     monitor treatment for     MRSA infections.  URINE CULTURE     Status:  None   Collection Time    12/10/12  6:08 AM      Result Value Range Status   Specimen Description URINE, CATHETERIZED   Final   Special Requests Normal   Final   Culture  Setup Time     Final   Value: 12/10/2012 11:46     Performed at [place]     Final   Value: NO GROWTH     Performed at [facility]   Culture     Final   Value: NO GROWTH     Performed at [facility]   Report Status 12/11/2012 FINAL   Final  CLOSTRIDIUM DIFFICILE BY PCR     Status: None   Collection Time    12/11/12 11:38 PM      Result Value Range Status   C difficile by pcr NEGATIVE  NEGATIVE Final  CULTURE, BLOOD (ROUTINE X 2)     Status: None   Collection Time    12/13/12  1:20 PM      Result Value Range Status   Specimen Description BLOOD LEFT HAND   Final   Special Requests BOTTLES DRAWN AEROBIC ONLY 5CC   Final   Culture  Setup Time     Final   Value: 12/13/2012 18:30     Performed at [facility]   Culture     Final   Value:        BLOOD CULTURE RECEIVED NO GROWTH TO DATE CULTURE WILL BE HELD FOR 5 DAYS BEFORE ISSUING A FINAL NEGATIVE REPORT     Performed at [facility]   Report Status PENDING   Incomplete     Studies: No results found.  Scheduled Meds: . pantoprazole (PROTONIX) IV  40 mg Intravenous QHS  . piperacillin-tazobactam (ZOSYN)  IV  3.375 g Intravenous Q8H   Continuous Infusions:    Principal Problem:   SBO (small bowel obstruction) Active Problems:   CROHN'S DISEASE   Septic shock(785.52)   AKI (acute kidney injury)    Time spent: 25 minutes.     Heidie Krall  [facility] Pager [phone]. If 7PM-7AM, please contact night-coverage at  [email or web address], password [facility] 12/14/2012, 2:09 PM  LOS: 5 days

## 2012-12-14 NOTE — Progress Notes (Signed)
Physical Therapy Treatment Patient Details Name: Ricardo Villa MRN: [id number] DOB: 08-17-1934 Today's Date: 12/14/2012 Time: 2376-2831 PT Time Calculation (min): 31 min  PT Assessment / Plan / Recommendation  History of Present Illness 78 y/o male with Crohn's disease admitted on 10/2 with small bowel obstruction and septic shock.   PT Comments   Pt progressing well towards physical therapy goals. Pt reports notable decrease in strength and endurance since being admitted to the hospital. However, pt was able to ambulate well with the [facility] and negotiate steps without LOB. Supervision with ambulation required, as pt continues to be unsteady at times even with SPC. IV pole limited number of stairs attempted, but 3 steps was sufficient to judge safety entering home at d/c.   Follow Up Recommendations  Home health PT;Supervision - Intermittent     Does the patient have the potential to tolerate intense rehabilitation     Barriers to Discharge        Equipment Recommendations  None recommended by PT    Recommendations for Other Services    Frequency Min 3X/week   Progress towards PT Goals Progress towards PT goals: Progressing toward goals  Plan Current plan remains appropriate    Precautions / Restrictions Precautions Precautions: None Restrictions Weight Bearing Restrictions: No   Pertinent Vitals/Pain Pt reports no pain throughout session.    Mobility  Bed Mobility Bed Mobility: Supine to Sit;Sitting - Scoot to [employer] of Bed;Sit to Supine;Scooting to [facility] Supine to Sit: 6: Modified independent (Device/Increase time);HOB flat Sitting - Scoot to Edge of Bed: 6: Modified independent (Device/Increase time);With rail Sit to Supine: 5: Supervision;HOB flat Scooting to HOB: 5: Supervision Details for Bed Mobility Assistance: Use of bed rail for support sit>supine. Demonstrates good sequencing and safety awareness Transfers Transfers: Sit to Stand;Stand to Sit Sit to Stand: 4: Min  guard;From bed;With upper extremity assist Stand to Sit: 5: Supervision;To bed;With upper extremity assist Details for Transfer Assistance: VC's for safety awareness with [facility] Ambulation/Gait Ambulation/Gait Assistance: 4: Min guard;4: Min assist Ambulation Distance (Feet): 350 Feet Assistive device: Straight cane Ambulation/Gait Assistance Details: Occasional min assist to recover for LOB laterally. Pt improves with balance with cane in R hand, and also improves with distance. Gait Pattern: Step-through pattern;Decreased stride length;Decreased trunk rotation Gait velocity: decreased Stairs: Yes Stairs Assistance: 4: Min guard Stair Management Technique: No rails;With cane Number of Stairs: 3    Exercises General Exercises - Lower Extremity Straight Leg Raises: 15 reps;Both;Supine   PT Diagnosis:    PT Problem List:   PT Treatment Interventions:     PT Goals (current goals can now be found in the care plan section) Acute Rehab PT Goals Patient Stated Goal: To d/c home PT Goal Formulation: With patient/family Time For Goal Achievement: 12/20/12 Potential to Achieve Goals: Good  Visit Information  Last PT Received On: 12/14/12 Assistance Needed: +1 History of Present Illness: 78 y/o male with Crohn's disease admitted on 10/2 with small bowel obstruction and septic shock.    Subjective Data  Patient Stated Goal: To d/c home   Cognition  Cognition Arousal/Alertness: Awake/alert Behavior During Therapy: WFL for tasks assessed/performed Overall Cognitive Status: Within Functional Limits for tasks assessed    Balance  Balance Balance Assessed: Yes Static Sitting Balance Static Sitting - Balance Support: No upper extremity supported;Feet supported Static Sitting - Level of Assistance: 6: Modified independent (Device/Increase time) Static Sitting - Comment/# of Minutes: 5 Static Standing Balance Static Standing - Balance Support: Left upper extremity supported  Static  Standing - Level of Assistance: 5: Stand by assistance  End of Session PT - End of Session Equipment Utilized During Treatment: Gait belt Activity Tolerance: Patient tolerated treatment well Patient left: in bed;with call bell/phone within reach;with family/visitor present Nurse Communication: Mobility status   GP     Jolyn Lent 12/14/2012, 11:51 AM  Jolyn Lent, PT, DPT Acute Rehabilitation Services

## 2012-12-14 NOTE — Progress Notes (Signed)
Social visit. We were not asked to see the pt but I have been taking care of him for past 20 years and the family requested that I see him. I have reviewed the chart and examined the pt. He is clearly getting better after an acute SBO.Marland KitchenLikely anastomotic stricture at the ile-colostomy site. I recommend for the pt to start a rapid steroid taper to reduce edema at the anastomotic site: Prednisone 30 mg/day x 1 week, 4m/day x 1 week, 15 mg/day x 1 week, 10 mg/day x 1 week, 570mday x 1 week. Please restart 6MP 5o mg daily.  Appointment with me October  24, 2.30 pm Friday. Low residue diet ( no corn). He will likely need a colonoscopy when he recovers. Thanx DoDelfin Edis

## 2012-12-14 NOTE — Evaluation (Addendum)
Occupational Therapy Evaluation Patient Details Name: Ricardo Villa MRN: [id number] DOB: 19-Feb-1935 Today's Date: 12/14/2012 Time: 0912-0940 OT Time Calculation (min): 28 min  OT Assessment / Plan / Recommendation History of present illness 78 y/o male with Crohn's disease admitted on 10/2 with small bowel obstruction and septic shock.   Clinical Impression   Pt admitted with above. OT provided education to patient and wife. Feel pt is safe to d/c home from OT standpoint with spouse available to assist 24/7.     OT Assessment  Patient does not need any further OT services    Follow Up Recommendations  No OT follow up;Supervision - Intermittent    Barriers to Discharge      Equipment Recommendations  None recommended by OT    Recommendations for Other Services    Frequency       Precautions / Restrictions Precautions Precautions: None Restrictions Weight Bearing Restrictions: No   Pertinent Vitals/Pain No pain reported.     ADL  Eating/Feeding: Independent Where Assessed - Eating/Feeding: Chair Grooming: Set up Where Assessed - Grooming: Supported sitting Upper Body Bathing: Set up Where Assessed - Upper Body Bathing: Supported sitting Lower Body Bathing: Supervision/safety Where Assessed - Lower Body Bathing: Unsupported sit to stand Upper Body Dressing: Set up Where Assessed - Upper Body Dressing: Supported sitting Lower Body Dressing: Supervision/safety;Set up Where Assessed - Lower Body Dressing: Unsupported sit to stand Toilet Transfer: Supervision/safety Toilet Transfer Method: Sit to Loss adjuster, chartered: Comfort height toilet;Grab bars Tub/Shower Transfer: Simulated;Min guard Tub/Shower Transfer Method: Therapist, art: Civil engineer, contracting with back Equipment Used: Gait belt;Cane Transfers/Ambulation Related to ADLs: Min guard/Min A for ambulation-LOB on one occassion without cane. Supervision/Min guard for transfers. ADL  Comments: Practiced simulated shower transfer. Educated on safety with LB dressing.  Educated to sit to bathe as much as he can. Recommended pt to use cane. Wife asked about exercises-OT educated on chair push ups (pt performed a few) that will assist with transfers although pt moving well with transfers.    OT Diagnosis:    OT Problem List:   OT Treatment Interventions:     OT Goals(Current goals can be found in the care plan section) Acute Rehab OT Goals Patient Stated Goal: Go home  Visit Information  Last OT Received On: 12/14/12 Assistance Needed: +1 History of Present Illness: 78 y/o male with Crohn's disease admitted on 10/2 with small bowel obstruction and septic shock.       Prior [place] expects to be discharged to:: Private residence Living Arrangements: Spouse/significant other Available Help at Discharge: Family;Available 24 hours/day Type of Home: House Home Access: Stairs to enter [employer] of Steps: 3 Entrance Stairs-Rails: None Home Layout: One level Home Equipment: Cane - single point;Shower seat Prior Function Level of Independence: Independent Communication Communication: No difficulties Dominant Hand: Right         Vision/Perception     Cognition  Cognition Arousal/Alertness: Awake/alert Behavior During Therapy: WFL for tasks assessed/performed Overall Cognitive Status: Within Functional Limits for tasks assessed    Extremity/Trunk Assessment Upper Extremity Assessment Upper Extremity Assessment: Overall WFL for tasks assessed Lower Extremity Assessment Lower Extremity Assessment: Defer to PT evaluation     Mobility Bed Mobility Bed Mobility: Supine to Sit Supine to Sit: 6: Modified independent (Device/Increase time) Transfers Transfers: Sit to Stand;Stand to Sit Sit to Stand: 5: Supervision;4: Min guard;With upper extremity assist;From chair/3-in-1;From toilet;From bed Stand to Sit: 5:  Supervision;4:  Min guard;With upper extremity assist;To chair/3-in-1;To toilet Details for Transfer Assistance: Min guard for sit <> stand to/from chair without arms. Cues for technique           End of Session OT - End of Session Equipment Utilized During Treatment: Gait belt;Other (comment) (cane) Activity Tolerance: Patient tolerated treatment well Patient left: in chair;with call bell/phone within reach;with family/visitor present  GO     Benito Mccreedy OTR/L [phone] 12/14/2012, 12:20 PM

## 2012-12-14 NOTE — Progress Notes (Signed)
Patient ID: Ricardo Villa, male   DOB: [birth year], 78 y.o.   MRN: [id number]    Subjective: Pt doing well.  Tolerating full liquids.  No pain.  Passing BMs  Objective: Vital signs in last 24 hours: Temp:  [97.5 F (36.4 C)-98.4 F (36.9 C)] 97.5 F (36.4 C) (10/06 0548) Pulse Rate:  [71-74] 74 (10/06 0548) Resp:  [18-20] 20 (10/06 0548) BP: (115-133)/(55-84) 133/84 mmHg (10/06 0548) SpO2:  [92 %-93 %] 93 % (10/06 0548) Last BM Date: 12/13/12  Intake/Output from previous day: 10/05 0701 - 10/06 0700 In: 960 [P.O.:960] Out: 1312 [Urine:1312] Intake/Output this shift:    PE: Abd: soft, Nt, ND, +BS  Lab Results:   Recent Labs  12/13/12 0615 12/14/12 0625  WBC 5.8 6.5  HGB 10.1* 11.1*  HCT 29.4* 31.3*  PLT 109* 129*   BMET  Recent Labs  12/13/12 0615 12/14/12 0625  NA 141 142  K 2.8* 3.4*  CL 109 111  CO2 24 22  GLUCOSE 107* 98  BUN 10 8  CREATININE 1.13 1.12  CALCIUM 7.6* 8.2*   PT/INR No results found for this basename: LABPROT, INR,  in the last 72 hours CMP     Component Value Date/Time   NA 142 12/14/2012 0625   K 3.4* 12/14/2012 0625   CL 111 12/14/2012 0625   CO2 22 12/14/2012 0625   GLUCOSE 98 12/14/2012 0625   BUN 8 12/14/2012 0625   CREATININE 1.12 12/14/2012 0625   CALCIUM 8.2* 12/14/2012 0625   PROT 4.9* 12/11/2012 0420   ALBUMIN 2.1* 12/11/2012 0420   AST 38* 12/11/2012 0420   ALT 19 12/11/2012 0420   ALKPHOS 65 12/11/2012 0420   BILITOT 0.9 12/11/2012 0420   GFRNONAA 62* 12/14/2012 0625   GFRAA 72* 12/14/2012 0625   Lipase     Component Value Date/Time   LIPASE 16 12/09/2012 2223       Studies/Results: No results found.  Anti-infectives: Anti-infectives   Start     Dose/Rate Route Frequency Ordered Stop   12/11/12 2300  vancomycin (VANCOCIN) IVPB 750 mg/150 ml premix  Status:  Discontinued     750 mg 150 mL/hr over 60 Minutes Intravenous Every 24 hours 12/10/12 2247 12/11/12 1351   12/11/12 2300  vancomycin (VANCOCIN) IVPB 1000  mg/200 mL premix  Status:  Discontinued     1,000 mg 200 mL/hr over 60 Minutes Intravenous Every 24 hours 12/11/12 1351 12/12/12 1400   12/10/12 2300  vancomycin (VANCOCIN) 1,250 mg in sodium chloride 0.9 % 250 mL IVPB     1,250 mg 166.7 mL/hr over 90 Minutes Intravenous  Once 12/10/12 2247 12/11/12 0049   12/10/12 1800  piperacillin-tazobactam (ZOSYN) IVPB 3.375 g     3.375 g 12.5 mL/hr over 240 Minutes Intravenous Every 8 hours 12/10/12 1341     12/10/12 0600  piperacillin-tazobactam (ZOSYN) IVPB 2.25 g  Status:  Discontinued     2.25 g 100 mL/hr over 30 Minutes Intravenous 4 times per day 12/10/12 0411 12/10/12 1341       Assessment/Plan  1. 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 Crohn's disease  Plan: 1. Advance to a low fiber diet.  If he tolerates this today, he is stable from a surgical standpoint for dc home later today.  He should follow up with Dr. Olevia Perches for consideration of dilatation of this stricture.  He does not need surgical follow up.   LOS: 5 days    Ricardo Villa E 12/14/2012, 9:36 AM Pager: [phone]

## 2012-12-15 DIAGNOSIS — K5090 Crohn's disease, unspecified, without complications: Secondary | ICD-10-CM

## 2012-12-15 LAB — CULTURE, BLOOD (ROUTINE X 2)

## 2012-12-15 LAB — POTASSIUM: Potassium: 2.9 mEq/L — ABNORMAL LOW (ref 3.5–5.1)

## 2012-12-15 MED ORDER — PREDNISONE 10 MG PO TABS
30.0000 mg | ORAL_TABLET | Freq: Every day | ORAL | Status: DC
  Administered 2012-12-16: 30 mg via ORAL
  Filled 2012-12-15 (×3): qty 1

## 2012-12-15 MED ORDER — POTASSIUM CHLORIDE 20 MEQ/15ML (10%) PO LIQD
40.0000 meq | Freq: Once | ORAL | Status: AC
  Administered 2012-12-16: 40 meq via ORAL
  Filled 2012-12-15: qty 30

## 2012-12-15 MED ORDER — PANTOPRAZOLE SODIUM 40 MG PO TBEC
40.0000 mg | DELAYED_RELEASE_TABLET | Freq: Every day | ORAL | Status: DC
  Administered 2012-12-16: 40 mg via ORAL
  Filled 2012-12-15: qty 1

## 2012-12-15 MED ORDER — CIPROFLOXACIN HCL 500 MG PO TABS
500.0000 mg | ORAL_TABLET | Freq: Two times a day (BID) | ORAL | Status: DC
  Administered 2012-12-15 – 2012-12-16 (×3): 500 mg via ORAL
  Filled 2012-12-15 (×5): qty 1

## 2012-12-15 MED ORDER — MERCAPTOPURINE 50 MG PO TABS
50.0000 mg | ORAL_TABLET | Freq: Every day | ORAL | Status: DC
  Administered 2012-12-16: 50 mg via ORAL
  Filled 2012-12-15 (×2): qty 1

## 2012-12-15 MED ORDER — POTASSIUM CHLORIDE 20 MEQ/15ML (10%) PO LIQD
40.0000 meq | Freq: Once | ORAL | Status: AC
  Administered 2012-12-15: 40 meq via ORAL
  Filled 2012-12-15: qty 30

## 2012-12-15 NOTE — Progress Notes (Signed)
[facility] PROGRESS NOTE  Ricardo Villa UYQ:[id number] DOB: Aug 23, 1934 DOA: 12/09/2012 PCP: [name], MD  Assessment/Plan: 78 y/o male with Crohn's disease admitted on 10/2 with small bowel obstruction and septic shock. He was found to have Klebsiella bacteremia. Sensitive to Ciprofloxacin. He received  5 days IV zosyn. Repeat Blood cultures are pending. He will need 5 more days of antibiotics. During this admission his platelet count decrease more than 50 %. He received a dose of heparin. Platelet has been increasing. HIT panel is pending. His SBP has clinically resolved. He was diagnosed with  aspiration PNA during this admission which was cover with zosyn. He also develop acute renal failure with Cr peak at 2.9. Cr has decrease to 1.1. Renal function improved with IV fluids. Patient has also history of Crohn diseases. Dr Olevia Perches thinks that's crohn diseases is  underline cause of SBO.  Dr Olevia Perches recommend : Prednisone 30 mg/day x 1 week, 56m/day x 1 week, 15 mg/day x 1 week, 10 mg/day x 1 week, 564mday x 1 week. restart 6MP 5o mg daily. I have started prednisone. Please provide prescription to the patient at discharge.   Disposition: Waiting HIT panel result prior to discharge patient.     1-SBO: Patient with PMH of Crohn diseases, cholecystectomy, terminal ileum resection, appendectomy.  -tolerating regular  diet. C diff negative.  -NG tube removed 10-04.  -KUB 10-04: No evidence of mechanical small bowel obstruction. Potential ileus or partial obstruction.   2-Severe sepsis : Resolved.  abdominal (? translocation) and possible aspiration.He received 5 days IV of zosyn to cover for aspiration. Blood culture 1 of 2 staph coagulase negative likely contaminant. Resolved with IV fluids. Lactic acid decrease to 2.2.   3-Aspiration PNA ?:  patient hd an episode of vomiting and worsening hypoxemia. -Clear CXR but R>L basilar infiltrates on CT abdomen.  -Requiring less  amount of oxygen. Received  Zosyn for 5 days. Pulmonary toilet.  Albuterol PRN.   4-Acute renal failure: due to sepsis/hypovolemia, baseline Cr 1.3-1.5 Cr peak to 2.9, improving with IV fluids. Cr has decrease to 1.1.  -Will NSL. Tolerating diet.   5-Hypovolemic/septic shock - improved after 6 l NS. In setting dehydration, hypovolemia. Resolved.   6-History of Crohn; started on prednisone. Need prednisone taper at discharge. read above.   7-Staph Coagulase negative 1 of 2 blood culture: probably contaminate.  Vancomycin discontinue.   8-Hypokalemia: repeat K level pending.   9-klebsiella pneumonia  1 of 2 blood culture:  Repeat Blood culture 10-05. Sensitive to ciprofloxacin. Received 5 days IV zosyn. Continue with cipro for 5 more days.   10-Thrombocytopenia/ anemia: anemia could be anemia critical illness.  Guaiac stool negative. HB stable. . Patient received heparin. HIT panel pending. Less likely HIT. Platelet increasing. Thrombocytopenia could be secondary to sepsis.   Code Status: Full code.  Family Communication: Care discussed with patient.  Disposition Plan: Remain inpatient. Home when HIT panel results available.   Consultants:  Surgery  CCM transfer care to [facility].   Procedures:  none  Antibiotics:  Vancomycin 10-03  Zosyn 10-02  HPI/Subjective: Feeling better.  Tolerating diet. Was walking in the hall.   Objective: Filed Vitals:   12/15/12 0523  BP: 143/85  Pulse: 76  Temp: 97.6 F (36.4 C)  Resp: 20    Intake/Output Summary (Last 24 hours) at 12/15/12 1312 Last data filed at 12/15/12 0934  Gross per 24 hour  Intake    160 ml  Output   1225  ml  Net  -1065 ml   Filed Weights   12/10/12 0435 12/11/12 0500 12/11/12 1551  Weight: 76.7 kg (169 lb 1.5 oz) 76.7 kg (169 lb 1.5 oz) 77.111 kg (170 lb)    Exam:   General:  No distress.   Cardiovascular: S 1, S 2 RRR  Respiratory: CTA  Abdomen: BS present, soft, NT, no significant  distention.   Musculoskeletal: no edema.   Data Reviewed: Basic Metabolic Panel:  Recent Labs Lab 12/10/12 1140 12/11/12 0420 12/12/12 1555 12/13/12 0615 12/14/12 0625  NA 145 143 142 141 142  K 4.0 3.8 3.3* 2.8* 3.4*  CL 113* 113* 111 109 111  CO2 17* 22 23 24 22   GLUCOSE 105* 130* 106* 107* 98  BUN 32* 32* 13 10 8   CREATININE 2.32* 1.86* 1.11 1.13 1.12  CALCIUM 7.1* 7.0* 7.3* 7.6* 8.2*   Liver Function Tests:  Recent Labs Lab 12/09/12 2150 12/11/12 0420  AST 52* 38*  ALT 31 19  ALKPHOS 60 65  BILITOT 2.2* 0.9  PROT 7.3 4.9*  ALBUMIN 3.9 2.1*    Recent Labs Lab 12/09/12 2223  LIPASE 16   No results found for this basename: AMMONIA,  in the last 168 hours CBC:  Recent Labs Lab 12/09/12 2150 12/11/12 0420 12/13/12 0615 12/14/12 0625  WBC 4.8 7.5 5.8 6.5  NEUTROABS 3.8  --   --   --   HGB 15.6 10.8* 10.1* 11.1*  HCT 45.8 31.6* 29.4* 31.3*  MCV 93.7 93.5 91.0 90.2  PLT 268 149* 109* 129*   Cardiac Enzymes:  Recent Labs Lab 12/09/12 2223 12/10/12 0550  TROPONINI <0.30 <0.30   BNP (last 3 results) No results found for this basename: PROBNP,  in the last 8760 hours CBG:  Recent Labs Lab 12/10/12 0446  GLUCAP 91    Recent Results (from the past 240 hour(s))  CULTURE, BLOOD (ROUTINE X 2)     Status: None   Collection Time    12/09/12 10:20 PM      Result Value Range Status   Specimen Description BLOOD LEFT WRIST   Final   Special Requests     Final   Value: BOTTLES DRAWN AEROBIC AND ANAEROBIC AER 5cc ANA 5cc   Culture  Setup Time     Final   Value: 12/10/2012 03:56     Performed at [facility]   Culture     Final   Value: STAPHYLOCOCCUS SPECIES (COAGULASE NEGATIVE)     Note: THE SIGNIFICANCE OF ISOLATING THIS ORGANISM FROM A SINGLE SET OF BLOOD CULTURES WHEN MULTIPLE SETS ARE DRAWN IS UNCERTAIN. PLEASE NOTIFY THE MICROBIOLOGY DEPARTMENT WITHIN ONE WEEK IF SPECIATION AND SENSITIVITIES ARE REQUIRED.     Note: Gram Stain Report  Called to,Read Back By and Verified With: MANDIE ROLLS ON 12/10/2012 AT 10:05P BY WILEJ     Performed at [facility]   Report Status 12/12/2012 FINAL   Final  CULTURE, BLOOD (ROUTINE X 2)     Status: None   Collection Time    12/09/12 10:30 PM      Result Value Range Status   Specimen Description BLOOD RIGHT WRIST   Final   Special Requests     Final   Value: BOTTLES DRAWN AEROBIC AND ANAEROBIC AER 3cc ANA 3cc   Culture  Setup Time     Final   Value: 12/10/2012 03:57     Performed at [facility]     Final  Value: KLEBSIELLA PNEUMONIAE     Note: Gram Stain Report Called to,Read Back By and Verified With: LAURA TURNER 12/13/12 @ 11:28AM BY RUSCOE A.     Performed at [facility]   Report Status 12/15/2012 FINAL   Final   Organism ID, Bacteria KLEBSIELLA PNEUMONIAE   Final  MRSA PCR SCREENING     Status: None   Collection Time    12/10/12  6:07 AM      Result Value Range Status   MRSA by PCR NEGATIVE  NEGATIVE Final   Comment:            The GeneXpert MRSA Assay (FDA     approved for NASAL specimens     only), is one component of a     comprehensive MRSA colonization     surveillance program. It is not     intended to diagnose MRSA     infection nor to guide or     monitor treatment for     MRSA infections.  URINE CULTURE     Status: None   Collection Time    12/10/12  6:08 AM      Result Value Range Status   Specimen Description URINE, CATHETERIZED   Final   Special Requests Normal   Final   Culture  Setup Time     Final   Value: 12/10/2012 11:46     Performed at [facility]     Final   Value: NO GROWTH     Performed at [facility]   Culture     Final   Value: NO GROWTH     Performed at [facility]   Report Status 12/11/2012 FINAL   Final  CLOSTRIDIUM DIFFICILE BY PCR     Status: None   Collection Time    12/11/12 11:38 PM      Result Value Range Status   C difficile by pcr NEGATIVE   NEGATIVE Final  CULTURE, BLOOD (ROUTINE X 2)     Status: None   Collection Time    12/13/12  1:20 PM      Result Value Range Status   Specimen Description BLOOD LEFT HAND   Final   Special Requests BOTTLES DRAWN AEROBIC ONLY 5CC   Final   Culture  Setup Time     Final   Value: 12/13/2012 18:30     Performed at [facility]   Culture     Final   Value:        BLOOD CULTURE RECEIVED NO GROWTH TO DATE CULTURE WILL BE HELD FOR 5 DAYS BEFORE ISSUING A FINAL NEGATIVE REPORT     Performed at [facility]   Report Status PENDING   Incomplete     Studies: No results found.  Scheduled Meds: . ciprofloxacin  500 mg Oral BID  . pantoprazole (PROTONIX) IV  40 mg Intravenous QHS  . [START ON 12/16/2012] predniSONE  30 mg Oral Q breakfast   Continuous Infusions:    Principal Problem:   SBO (small bowel obstruction) Active Problems:   CROHN'S DISEASE   Septic shock(785.52)   AKI (acute kidney injury)    Time spent: 25 minutes.     Ainsleigh Kakos  [facility] Pager [phone]. If 7PM-7AM, please contact night-coverage at [email or web address], password [facility] 12/15/2012, 1:12 PM  LOS: 6 days

## 2012-12-15 NOTE — Progress Notes (Signed)
Patient examined and I agree with the assessment and plan  Georganna Skeans, MD, MPH, FACS Pager: [phone]  12/15/2012 1:55 PM

## 2012-12-15 NOTE — Progress Notes (Signed)
Subjective: Pt feels great.  Pain resolved.  No N/V, tolerating regular diet.  Ambulating well.  +BM's and urinating well.  Objective: Vital signs in last 24 hours: Temp:  [97.6 F (36.4 C)-98.2 F (36.8 C)] 97.6 F (36.4 C) (10/07 0523) Pulse Rate:  [76-79] 76 (10/07 0523) Resp:  [20] 20 (10/07 0523) BP: (132-143)/(66-85) 143/85 mmHg (10/07 0523) SpO2:  [94 %] 94 % (10/07 0523) Last BM Date: 12/14/12  Intake/Output from previous day: 10/06 0701 - 10/07 0700 In: 160 [IV Piggyback:160] Out: 625 [Urine:625] Intake/Output this shift: Total I/O In: -  Out: 200 [Urine:200]  PE: Gen:  Alert, NAD, pleasant Abd: Soft, NT/ND, +BS, no HSM, midline abdominal scar well healed   Lab Results:   Recent Labs  12/13/12 0615 12/14/12 0625  WBC 5.8 6.5  HGB 10.1* 11.1*  HCT 29.4* 31.3*  PLT 109* 129*   BMET  Recent Labs  12/13/12 0615 12/14/12 0625  NA 141 142  K 2.8* 3.4*  CL 109 111  CO2 24 22  GLUCOSE 107* 98  BUN 10 8  CREATININE 1.13 1.12  CALCIUM 7.6* 8.2*   PT/INR No results found for this basename: LABPROT, INR,  in the last 72 hours CMP     Component Value Date/Time   NA 142 12/14/2012 0625   K 3.4* 12/14/2012 0625   CL 111 12/14/2012 0625   CO2 22 12/14/2012 0625   GLUCOSE 98 12/14/2012 0625   BUN 8 12/14/2012 0625   CREATININE 1.12 12/14/2012 0625   CALCIUM 8.2* 12/14/2012 0625   PROT 4.9* 12/11/2012 0420   ALBUMIN 2.1* 12/11/2012 0420   AST 38* 12/11/2012 0420   ALT 19 12/11/2012 0420   ALKPHOS 65 12/11/2012 0420   BILITOT 0.9 12/11/2012 0420   GFRNONAA 62* 12/14/2012 0625   GFRAA 72* 12/14/2012 0625   Lipase     Component Value Date/Time   LIPASE 16 12/09/2012 2223       Studies/Results: No results found.  Anti-infectives: Anti-infectives   Start     Dose/Rate Route Frequency Ordered Stop   12/11/12 2300  vancomycin (VANCOCIN) IVPB 750 mg/150 ml premix  Status:  Discontinued     750 mg 150 mL/hr over 60 Minutes Intravenous Every 24 hours  12/10/12 2247 12/11/12 1351   12/11/12 2300  vancomycin (VANCOCIN) IVPB 1000 mg/200 mL premix  Status:  Discontinued     1,000 mg 200 mL/hr over 60 Minutes Intravenous Every 24 hours 12/11/12 1351 12/12/12 1400   12/10/12 2300  vancomycin (VANCOCIN) 1,250 mg in sodium chloride 0.9 % 250 mL IVPB     1,250 mg 166.7 mL/hr over 90 Minutes Intravenous  Once 12/10/12 2247 12/11/12 0049   12/10/12 1800  piperacillin-tazobactam (ZOSYN) IVPB 3.375 g     3.375 g 12.5 mL/hr over 240 Minutes Intravenous Every 8 hours 12/10/12 1341     12/10/12 0600  piperacillin-tazobactam (ZOSYN) IVPB 2.25 g  Status:  Discontinued     2.25 g 100 mL/hr over 30 Minutes Intravenous 4 times per day 12/10/12 0411 12/10/12 1341       Assessment/Plan 1. SBO secondary to strictured anastomosis  2. Crohn's disease   Plan:  1. Tolerating low fiber diet well.  He should follow up with Dr. Olevia Perches for consideration of dilatation of his stricture. He does not need surgical follow up.  2.  Medicine is holding the patient inpatient until repeat blood cultures are back.  Will sign off at this point.  Call with questions/concerns. 3.  Agree with GI recommendation for Crohns including steroid taper, restarting 6MP and OP follow up    LOS: 6 days    DORT, Vineta Carone 12/15/2012, 8:38 AM Pager: [phone]

## 2012-12-15 NOTE — Progress Notes (Signed)
Physical Therapy Treatment Patient Details Name: Ricardo Villa MRN: [id number] DOB: Nov 15, 1934 Today's Date: 12/15/2012 Time: 1040-1100 PT Time Calculation (min): 20 min  PT Assessment / Plan / Recommendation  History of Present Illness 78 y/o male with Crohn's disease admitted on 10/2 with small bowel obstruction and septic shock.   PT Comments   Patient progressing with ambulation. Did not feel as if he needs to practice steps again. Anticipate DC home soon based on patients talk.   Follow Up Recommendations  Home health PT;Supervision - Intermittent     Does the patient have the potential to tolerate intense rehabilitation     Barriers to Discharge        Equipment Recommendations  None recommended by PT    Recommendations for Other Services    Frequency Min 3X/week   Progress towards PT Goals Progress towards PT goals: Progressing toward goals  Plan Current plan remains appropriate    Precautions / Restrictions Precautions Precautions: None   Pertinent Vitals/Pain no apparent distress     Mobility  Bed Mobility Supine to Sit: 6: Modified independent (Device/Increase time) Transfers Sit to Stand: 6: Modified independent (Device/Increase time) Stand to Sit: 6: Modified independent (Device/Increase time) Ambulation/Gait Ambulation/Gait Assistance: 5: Supervision Ambulation Distance (Feet): 600 Feet Assistive device: Straight cane Gait Pattern: Step-through pattern;Decreased stride length    Exercises     PT Diagnosis:    PT Problem List:   PT Treatment Interventions:     PT Goals (current goals can now be found in the care plan section)    Visit Information  Last PT Received On: 12/15/12 Assistance Needed: +1 History of Present Illness: 78 y/o male with Crohn's disease admitted on 10/2 with small bowel obstruction and septic shock.    Subjective Data      Cognition  Cognition Arousal/Alertness: Awake/alert Behavior During Therapy: WFL for tasks  assessed/performed Overall Cognitive Status: Within Functional Limits for tasks assessed    Balance     End of Session PT - End of Session Equipment Utilized During Treatment: Gait belt Activity Tolerance: Patient tolerated treatment well Patient left: in bed;with call bell/phone within reach;with family/visitor present Nurse Communication: Mobility status   GP     [name] 12/15/2012, 12:31 PM 12/15/2012 [name] PTA [phone] pager [phone] office

## 2012-12-16 DIAGNOSIS — N390 Urinary tract infection, site not specified: Secondary | ICD-10-CM

## 2012-12-16 LAB — BASIC METABOLIC PANEL
BUN: 9 mg/dL (ref 6–23)
CO2: 19 mEq/L (ref 19–32)
Chloride: 113 mEq/L — ABNORMAL HIGH (ref 96–112)
Creatinine, Ser: 0.96 mg/dL (ref 0.50–1.35)
GFR calc Af Amer: >90 mL/min (ref >90)
Glucose, Bld: 92 mg/dL (ref 70–99)
Potassium: 3.8 mEq/L (ref 3.5–5.1)
Sodium: 144 mEq/L (ref 135–145)

## 2012-12-16 LAB — CBC
HCT: 32.5 % — ABNORMAL LOW (ref 39.0–52.0)
Hemoglobin: 11.5 g/dL — ABNORMAL LOW (ref 13.0–17.0)
MCH: 31.9 pg (ref 26.0–34.0)
MCV: 90 fL (ref 78.0–100.0)
RBC: 3.61 MIL/uL — ABNORMAL LOW (ref 4.22–5.81)

## 2012-12-16 LAB — HEPARIN INDUCED THROMBOCYTOPENIA PNL
UFH High Dose UFH H: 0 % Release
UFH Low Dose 0.1 IU/mL: 0 % Release
UFH SRA Result: NEGATIVE

## 2012-12-16 MED ORDER — CIPROFLOXACIN HCL 500 MG PO TABS: 500.0000 mg | ORAL_TABLET | Freq: Two times a day (BID) | ORAL | Status: DC

## 2012-12-16 MED ORDER — PREDNISONE (PAK) 10 MG PO TABS: ORAL_TABLET | ORAL | Status: DC

## 2012-12-16 NOTE — Discharge Planning (Signed)
Patient discharged home in stable condition. Verbalizes understanding of all discharge instructions, including home medications and follow up appointments. 

## 2012-12-16 NOTE — Discharge Summary (Signed)
Physician Discharge Summary  Ricardo Villa:[id number] DOB: 1934-06-22 DOA: 12/09/2012  PCP: [name], MD  Admit date: 12/09/2012 Discharge date: 12/16/2012  Time spent: 35 minutes  Recommendations for Outpatient Follow-up:  1. Please repeat Chem-7 on follow up appointment. Patient having creatinine of 0.96 on the day of discharge, however was found to be in acute renal failure during this hospitalization  Discharge Diagnoses:  Principal Problem:   SBO (small bowel obstruction) Active Problems:   CROHN'S DISEASE   Septic shock(785.52)   AKI (acute kidney injury)   Discharge Condition: Stable/improved  Diet recommendation: Heart healthy diet  Filed Weights   12/10/12 0435 12/11/12 0500 12/11/12 1551  Weight: 76.7 kg (169 lb 1.5 oz) 76.7 kg (169 lb 1.5 oz) 77.111 kg (170 lb)    History of present illness:  78 y/o male with Crohn's disease admitted on 10/2 with small bowel obstruction and septic shock. He noted the sudden onset of nausea and vomiting on 10/2 without abdominal pain. He threw up multiple times throughout the day, never seeing hematemesis. He denied fevers or chills. He last kept down breakfast on 9/30. In the [facility] ER he was noted to have a small bowel obstruction and septic shock with an elevated lactic acid.   Hospital Course:  Patient is a pleasant 78 year old gentleman with a past medical history of Crohn's disease, status post ileal cecectomy with ileal colonic anastomosis done 30 years ago, presented with complaints of nausea vomiting as well as abdominal pain which started the day prior to admission. Given multiple episodes of emesis, he presented to [facility] where imaging studies showed small bowel obstruction secondary to a stricture at his ileocolonic anastomosis. He was also found to be in acute renal failure with a creatinine of 2.9. He was found to be hypotensive with elevated lactate. Patient required IV  pressors and admission to the intensive care unit, requiring a transfer to [facility]. He was initially admitted to the pulmonary critical care service.  Septic shock believed to be secondary to an intra-abdominal source as little as there was concern for gut translocation in setting of a small bowel obstruction. He was seen by Dr.Tsuei of surgery who felt that narrowing of ileocolic anastomosis was likely to be chronic and recommended attempting conservative therapy. Patient showed clinical improvement, as by 12/11/2012 found to be moving gas and having bowel movements. He was transferred out of the intensive care unit to telemetry given clinical improvement. Acute renal failure resolving as his creatinine came down to 1.11 by 12/12/2012. His diet was advanced, as he was found to be stable from a surgical standpoint. Surgery recommending followup with Dr. Olevia Perches for consideration of dilatation of stricture. Dr. Olevia Perches evaluated patient on 12/14/2012 and recommended starting a rapid steroid taper, prednisone 30 mg daily times one week, followed by his 20 mg by mouth q. daily times one week, followed by 15 mg by mouth daily times one week, followed by 10 mg by mouth daily times one week followed by 5 mg by mouth daily for one week. Appointment has been set for 01/01/2013. Patient lives require colonoscopy after recovery. He was discharged in stable condition on 12/16/2012  Procedures:  Status post central line placement, 12/11/2002  Consultations:  Surgery  Pulmonary critical care  Discharge Exam: Filed Vitals:   12/16/12 0521  BP: 137/74  Pulse: 88  Temp: 98.8 F (37.1 C)  Resp: 18    General: Patient is in no acute distress,  he is ambulating around his room reports feeling well tolerating by mouth intake Cardiovascular: Regular rate and rhythm normal S1-S2 no murmurs rubs or gallops Respiratory: Lungs are clear to auscultation bilaterally no wheezing rhonchi or rales Abdomen: Soft nontender  nondistended positive bowel sounds Extremity: No edema  Discharge Instructions      Discharge Orders   Future Appointments Provider Department Dept Phone   01/01/2013 2:30 PM Lafayette Dragon, MD [facility] [phone]   03/01/2013 10:00 AM Larey Seat, MD [facility] [phone]   Future Orders Complete By Expires   Call MD for:  persistant dizziness or light-headedness  As directed    Call MD for:  persistant nausea and vomiting  As directed    Call MD for:  severe uncontrolled pain  As directed    Call MD for:  temperature >100.4  As directed    Diet - low sodium heart healthy  As directed    Discharge instructions  As directed    Comments:     Please keep hospital follow up appointments   Increase activity slowly  As directed        Medication List         aspirin 81 MG tablet  Take 81 mg by mouth daily.     ciprofloxacin 500 MG tablet  Commonly known as:  CIPRO  Take 1 tablet (500 mg total) by mouth 2 (two) times daily.     diphenoxylate-atropine 2.5-0.025 MG per tablet  Commonly known as:  LOMOTIL  Take 1 tablet by mouth daily.     FAMOTIDINE PO  Take 1 tablet by mouth at bedtime.     fenofibrate 48 MG tablet  Commonly known as:  TRICOR  Take 48 mg by mouth daily.     mercaptopurine 50 MG tablet  Commonly known as:  PURINETHOL  Take 50 mg by mouth daily. Give on an empty stomach 1 hour before or 2 hours after meals. Caution: Chemotherapy.     predniSONE 10 MG tablet  Commonly known as:  STERAPRED UNI-PAK  Take 30 mg by mouth daily x 1 week, then 74m by mouth daily x 1 week, then 164mby mouth daily x 1 week, then 1040my mouth daily x 1 week, then 5 mg by mouth daily for 1 week. Quantity Sufficient for taper     PROBIOTIC DAILY PO  Take 1 tablet by mouth daily.     promethazine 25 MG suppository  Commonly known as:  PHENERGAN  Place 25 mg rectally every 6 (six) hours as needed for nausea.       No  Known Allergies Follow-up Information   Follow up with DorDelfin EdisD In 1 week. (October  24, 2.30 pm Friday. )    Specialty:  Gastroenterology   Contact information:   [address] [phone]     The results of significant diagnostics from this hospitalization (including imaging, microbiology, ancillary and laboratory) are listed below for reference.    Significant Diagnostic Studies: Ct Abdomen Pelvis Wo Contrast  12/10/2012   CLINICAL DATA:  Abdominal pain, nausea, vomiting. History of Crohn's disease.  EXAM: CT ABDOMEN AND PELVIS WITHOUT CONTRAST  TECHNIQUE: Multidetector CT imaging of the abdomen and pelvis was performed following the standard protocol without intravenous contrast.  COMPARISON:  01/14/2003  FINDINGS: Bilateral lower lobe airspace opacities are noted, some reticulonodular densities in other interstitial thickening and ground-glass opacities. Findings are nonspecific but may  represent chronic MAI infection. No pleural effusions. Heart is borderline in size.  Small bowel loops are dilated. Transition point is in the right abdomen in the area of prior surgical clips compatible with small bowel obstruction. No active inflammatory process visualized. Multiple radiopaque pills within the dilated small bowel loops. This could be related to Crohn's or postsurgical stricture.  Prior cholecystectomy. Liver, spleen, pancreas, adrenals and kidneys are unremarkable. Small nonobstructing stone in the lower pole of the right kidney. No ureteral stones or hydronephrosis.  Urinary bladder is decompressed, grossly unremarkable. Trace free fluid in the pelvis. Aorta and iliac vessels are calcified, non aneurysmal.  No acute bony abnormality.  IMPRESSION: Dilated, fluid-filled small bowel loops compatible with small bowel obstruction. Transition point in the right abdomen in the area of prior small bowel surgical changes.   Electronically Signed   By: Rolm Baptise  M.D.   On: 12/10/2012 00:53   Dg Abd 1 View  12/12/2012   CLINICAL DATA:  Followup small bowel obstruction  EXAM: ABDOMEN - 1 VIEW  COMPARISON:  Plain film 12/11/2012  FINDINGS: NG tube extends to the stomach. Single dilated loop of small bowel to 4 cm. . Surgical clips in right lower quadrant. There is gas and stool in the sigmoid colon.  IMPRESSION: No evidence of mechanical small bowel obstruction. Potential ileus or partial obstruction.   Electronically Signed   By: Suzy Bouchard M.D.   On: 12/12/2012 09:11   Dg Chest Port 1 View  12/10/2012   CLINICAL DATA:  Support devices, central line placement.  EXAM: PORTABLE CHEST - 1 VIEW  COMPARISON:  12/09/2012  FINDINGS: Interval placement of left central line. The tip is in the upper right atrium. No pneumothorax. Bibasilar atelectasis. Heart is normal size. No effusions.  IMPRESSION: Left central line tip at the upper right atrium. No pneumothorax.  Bibasilar atelectasis.   Electronically Signed   By: Rolm Baptise M.D.   On: 12/10/2012 06:44   Dg Chest Port 1 View  12/09/2012   CLINICAL DATA:  Weakness and vomiting.  EXAM: PORTABLE CHEST - 1 VIEW  COMPARISON:  03/01/2005.  FINDINGS: The cardiac silhouette, mediastinal and hilar contours are within normal limits. Low lung volumes with vascular crowding and streaky basilar atelectasis. The right lower lung lesion is no longer visualized. This was shown to be fluid/ pseudotumor in the fissure. The bony thorax is intact.  IMPRESSION: Low lung volumes with vascular crowding and streaky basilar atelectasis but no infiltrates or effusions.   Electronically Signed   By: Kalman Jewels M.D.   On: 12/09/2012 22:16   Dg Abd 2 Views  12/11/2012   *RADIOLOGY REPORT*  Clinical Data: Evaluate small bowel obstruction, subsequent encounter.  ABDOMEN - 2 VIEW  Comparison: 12/10/2012; abdominal CT - 12/09/2012  Findings:  There is persistent moderate gaseous distension of several loops of rather featureless appearing  small bowel with index looped within the mid upper abdomen measuring approximately 6.3 cm in diameter. There is a minimal amount of distal colonic gas seen within the splenic flexure and descending colon.  No pneumoperitoneum, pneumatosis or portal venous gas.  Post cholecystectomy.  Enteric staple lines overlies the right mid and lower abdomen. Partially opaque presumed pill fragments overlie the lower abdomen.  Limited visualization of the lower thorax suggests minimal right infrahilar opacities with possible trace bilateral effusions.  Enteric side port overlies the mid/distal esophagus. Central venous catheter tip overlies the superior cavoatrial junction.  No acute osseous abnormality.  IMPRESSION:  1.  Overall findings suggestive of improving but likely persistent partial small bowel obstruction.  2. Malpositioned enteric tube with side port projecting over the mid/distal esophagus.  Advancement is 20 cm is recommended.   Original Report Authenticated By: Jake Seats, MD   Dg Abd 2 Views  12/10/2012   CLINICAL DATA:  Nausea and vomiting yesterday. Small bowel obstruction on CT performed earlier this same date.  EXAM: ABDOMEN - 2 VIEW  COMPARISON:  CT, 12/10/2012 at 0048 hr  FINDINGS: Dilated small bowel is evident, with a loop of dilated small bowel noted in the left mid abdomen. No colonic dilation is seen. There are bowel anastomosis staples in the right mid to lower abdomen. Air-fluid levels are noted on the decubitus view. There is no free air.  A oval density with a lucent center projects in the left upper pelvis. This likely reflects 1 of the density seen within dilated small bowel on the CT.  A bone island lies in the right ilium.  A nasogastric tube has its tip in the mid stomach.  IMPRESSION: Small bowel obstruction, with most of the dilated small bowel loops being fluid filled. No convincing change from the earlier CT. No free air.   Electronically Signed   By: Lajean Manes   On: 12/10/2012 10:21     Microbiology: Recent Results (from the past 240 hour(s))  CULTURE, BLOOD (ROUTINE X 2)     Status: None   Collection Time    12/09/12 10:20 PM      Result Value Range Status   Specimen Description BLOOD LEFT WRIST   Final   Special Requests     Final   Value: BOTTLES DRAWN AEROBIC AND ANAEROBIC AER 5cc ANA 5cc   Culture  Setup Time     Final   Value: 12/10/2012 03:56     Performed at [facility]   Culture     Final   Value: STAPHYLOCOCCUS SPECIES (COAGULASE NEGATIVE)     Note: THE SIGNIFICANCE OF ISOLATING THIS ORGANISM FROM A SINGLE SET OF BLOOD CULTURES WHEN MULTIPLE SETS ARE DRAWN IS UNCERTAIN. PLEASE NOTIFY THE MICROBIOLOGY DEPARTMENT WITHIN ONE WEEK IF SPECIATION AND SENSITIVITIES ARE REQUIRED.     Note: Gram Stain Report Called to,Read Back By and Verified With: MANDIE ROLLS ON 12/10/2012 AT 10:05P BY WILEJ     Performed at [facility]   Report Status 12/12/2012 FINAL   Final  CULTURE, BLOOD (ROUTINE X 2)     Status: None   Collection Time    12/09/12 10:30 PM      Result Value Range Status   Specimen Description BLOOD RIGHT WRIST   Final   Special Requests     Final   Value: BOTTLES DRAWN AEROBIC AND ANAEROBIC AER 3cc ANA 3cc   Culture  Setup Time     Final   Value: 12/10/2012 03:57     Performed at [facility]   Culture     Final   Value: KLEBSIELLA PNEUMONIAE     Note: Gram Stain Report Called to,Read Back By and Verified With: LAURA TURNER 12/13/12 @ 11:28AM BY RUSCOE A.     Performed at [facility]   Report Status 12/15/2012 FINAL   Final   Organism ID, Bacteria KLEBSIELLA PNEUMONIAE   Final  MRSA PCR SCREENING     Status: None   Collection Time    12/10/12  6:07 AM      Result Value Range Status  MRSA by PCR NEGATIVE  NEGATIVE Final   Comment:            The GeneXpert MRSA Assay (FDA     approved for NASAL specimens     only), is one component of a     comprehensive MRSA colonization     surveillance program. It is not      intended to diagnose MRSA     infection nor to guide or     monitor treatment for     MRSA infections.  URINE CULTURE     Status: None   Collection Time    12/10/12  6:08 AM      Result Value Range Status   Specimen Description URINE, CATHETERIZED   Final   Special Requests Normal   Final   Culture  Setup Time     Final   Value: 12/10/2012 11:46     Performed at [facility]     Final   Value: NO GROWTH     Performed at [facility]   Culture     Final   Value: NO GROWTH     Performed at [facility]   Report Status 12/11/2012 FINAL   Final  CLOSTRIDIUM DIFFICILE BY PCR     Status: None   Collection Time    12/11/12 11:38 PM      Result Value Range Status   C difficile by pcr NEGATIVE  NEGATIVE Final  CULTURE, BLOOD (ROUTINE X 2)     Status: None   Collection Time    12/13/12  1:20 PM      Result Value Range Status   Specimen Description BLOOD LEFT HAND   Final   Special Requests BOTTLES DRAWN AEROBIC ONLY 5CC   Final   Culture  Setup Time     Final   Value: 12/13/2012 18:30     Performed at [facility]   Culture     Final   Value:        BLOOD CULTURE RECEIVED NO GROWTH TO DATE CULTURE WILL BE HELD FOR 5 DAYS BEFORE ISSUING A FINAL NEGATIVE REPORT     Performed at [facility]   Report Status PENDING   Incomplete     Labs: Basic Metabolic Panel:  Recent Labs Lab 12/11/12 0420 12/12/12 1555 12/13/12 0615 12/14/12 0625 12/15/12 1554 12/16/12 0557  NA 143 142 141 142  --  144  K 3.8 3.3* 2.8* 3.4* 2.9* 3.8  CL 113* 111 109 111  --  113*  CO2 22 23 24 22   --  19  GLUCOSE 130* 106* 107* 98  --  92  BUN 32* 13 10 8   --  9  CREATININE 1.86* 1.11 1.13 1.12  --  0.96  CALCIUM 7.0* 7.3* 7.6* 8.2*  --  8.2*   Liver Function Tests:  Recent Labs Lab 12/09/12 2150 12/11/12 0420  AST 52* 38*  ALT 31 19  ALKPHOS 60 65  BILITOT 2.2* 0.9  PROT 7.3 4.9*  ALBUMIN 3.9 2.1*    Recent Labs Lab  12/09/12 2223  LIPASE 16   No results found for this basename: AMMONIA,  in the last 168 hours CBC:  Recent Labs Lab 12/09/12 2150 12/11/12 0420 12/13/12 0615 12/14/12 0625 12/16/12 0557  WBC 4.8 7.5 5.8 6.5 10.6*  NEUTROABS 3.8  --   --   --   --   HGB 15.6 10.8* 10.1* 11.1* 11.5*  HCT 45.8 31.6* 29.4* 31.3* 32.5*  MCV 93.7 93.5 91.0 90.2 90.0  PLT 268 149* 109* 129* 228   Cardiac Enzymes:  Recent Labs Lab 12/09/12 2223 12/10/12 0550  TROPONINI <0.30 <0.30   BNP: BNP (last 3 results) No results found for this basename: PROBNP,  in the last 8760 hours CBG:  Recent Labs Lab 12/10/12 [place]       Signed:  Malerie Eakins  [facility] 12/16/2012, 12:44 PM

## 2012-12-19 LAB — CULTURE, BLOOD (ROUTINE X 2): Culture: NO GROWTH

## 2012-12-23 LAB — CULTURE, BLOOD (ROUTINE X 2)

## 2013-01-01 ENCOUNTER — Encounter: Payer: Self-pay | Admitting: Internal Medicine

## 2013-01-01 ENCOUNTER — Ambulatory Visit (INDEPENDENT_AMBULATORY_CARE_PROVIDER_SITE_OTHER): Payer: Medicare Other | Admitting: Internal Medicine

## 2013-01-01 VITALS — BP 108/70 | HR 80 | Ht 70.87 in | Wt 160.5 lb

## 2013-01-01 DIAGNOSIS — K5000 Crohn's disease of small intestine without complications: Secondary | ICD-10-CM

## 2013-01-01 DIAGNOSIS — K56609 Unspecified intestinal obstruction, unspecified as to partial versus complete obstruction: Secondary | ICD-10-CM

## 2013-01-01 MED ORDER — MOVIPREP 100 G PO SOLR: 1.0000 | Freq: Once | ORAL | Status: DC

## 2013-01-01 NOTE — Progress Notes (Signed)
TERRIE GRAJALES 1935/02/26 MRN [id number]  History of Present Illness:  This is a 78 year old white male who was discharged from the hospital one week ago after being treated for a small bowel obstruction. He has a history of Crohn's ileocolitis. He had a terminal ileal resection in 1988 and again in 1995. He was admitted with a distal ileal obstruction on CT scan , dehydration and sepsis. He has done very well on  prednisone taper starting at 30 mg a day and reducing by 5 mg every week. He is currently on 15 mg a day. He denies diarrhea, constipation, or abdominal pain. His last colonoscopy in July 2011 showed adenomatous polyps which was a tubovillous adenoma.   Past Medical History  Diagnosis Date  . Hyperhomocysteinemia   . Hyperlipidemia   . Small bowel obstruction   . History of adenomatous polyp of colon   . Vitamin B12 deficiency   . Crohn disease   . Prostate cancer    Past Surgical History  Procedure Laterality Date  . Cholecystectomy    . Terminal ileum resection    . Appendectomy      reports that he has quit smoking. He has never used smokeless tobacco. He reports that he drinks alcohol. He reports that he does not use illicit drugs. family history includes Crohn's disease in his mother; Diabetes in his paternal grandfather; Heart disease in his father. There is no history of Colon cancer. No Known Allergies      Review of Systems: Weight loss of 8 pounds.  The remainder of the 10 point ROS is negative except as outlined in H&P   Physical Exam: General appearance  Well developed, in no distress. Eyes- non icteric. HEENT nontraumatic, normocephalic. Mouth no lesions, tongue papillated, no cheilosis. Neck supple without adenopathy, thyroid not enlarged, no carotid bruits, no JVD. Lungs Clear to auscultation bilaterally. Cor normal S1, normal S2, regular rhythm, no murmur,  quiet precordium. Abdomen: normoactive bowel sounds. No tenderness. Well-healed surgical  scar. Liver edge at costal margin. Rectal: Not done. Extremities no pedal edema. Skin no lesions. Neurological alert and oriented x 3. Psychological normal mood and affect.  Assessment and Plan:  Problem #32 78 year old white male who had recent partial small bowel obstruction at the ileo colic anastomosis which responded to bowel rest. He is feeling fine on a prednisone taper. He has a stricture due to active or stricturing Crohn's disease. We will proceed with a colonoscopy and balloon dilation of the stricture if possible, to avoid future obstruction. He will continue a prednisone taper and low-residue diet. He may return to work 2 days a week. Problem #2 hx of iron def anemia- Problem #3 Hx of B12 malabsorption- being monitored   01/01/2013 [name]

## 2013-01-01 NOTE — Patient Instructions (Signed)
You have been scheduled for a colonoscopy with propofol. Please follow written instructions given to you at your visit today.  Please pick up your prep kit at the pharmacy within the next 1-3 days. If you use inhalers (even only as needed), please bring them with you on the day of your procedure. Your physician has requested that you go to www.startemmi.com and enter the access code given to you at your visit today. This web site gives a general overview about your procedure. However, you should still follow specific instructions given to you by our office regarding your preparation for the procedure.  We have given you a note to take to work.  Cc: Dr Drema Dallas

## 2013-01-05 ENCOUNTER — Encounter: Payer: Self-pay | Admitting: Internal Medicine

## 2013-01-05 NOTE — Telephone Encounter (Signed)
Error

## 2013-01-08 ENCOUNTER — Encounter: Payer: Self-pay | Admitting: *Deleted

## 2013-01-08 ENCOUNTER — Telehealth: Payer: Self-pay | Admitting: Internal Medicine

## 2013-01-08 NOTE — Telephone Encounter (Signed)
Spoke with patient and his work will not let him work just 2 days/week. He must work 3 days/week. Patient is asking for a letter stating he can return to work 3 days/week. Is this ok?

## 2013-01-08 NOTE — Telephone Encounter (Signed)
Patient also needs letter to state he can drive also.

## 2013-01-08 NOTE — Telephone Encounter (Signed)
Letter up front for pick up. Patient aware.

## 2013-01-08 NOTE — Telephone Encounter (Signed)
Spoke with patient and he will contact HR.

## 2013-01-08 NOTE — Telephone Encounter (Signed)
It is OK for the pt to return to work  3days/week.

## 2013-01-20 ENCOUNTER — Encounter: Payer: Medicare Other | Admitting: Internal Medicine

## 2013-01-26 ENCOUNTER — Encounter: Payer: Self-pay | Admitting: Internal Medicine

## 2013-01-26 NOTE — Telephone Encounter (Signed)
Error

## 2013-01-29 ENCOUNTER — Encounter: Payer: Self-pay | Admitting: *Deleted

## 2013-02-17 ENCOUNTER — Emergency Department (HOSPITAL_COMMUNITY): Payer: Medicare Other

## 2013-02-17 ENCOUNTER — Emergency Department (HOSPITAL_COMMUNITY)
Admission: EM | Admit: 2013-02-17 | Discharge: 2013-02-17 | Disposition: A | Discharge status: Home / Self Care | Payer: Medicare Other | Attending: Emergency Medicine | Admitting: Emergency Medicine

## 2013-02-17 ENCOUNTER — Ambulatory Visit (AMBULATORY_SURGERY_CENTER): Payer: Medicare Other | Admitting: Internal Medicine

## 2013-02-17 ENCOUNTER — Encounter: Payer: Self-pay | Admitting: Internal Medicine

## 2013-02-17 ENCOUNTER — Encounter (HOSPITAL_COMMUNITY): Payer: Self-pay | Admitting: Emergency Medicine

## 2013-02-17 ENCOUNTER — Telehealth: Payer: Self-pay | Admitting: Internal Medicine

## 2013-02-17 VITALS — BP 147/69 | HR 58 | Temp 97.8°F | Resp 19 | Ht 70.0 in | Wt 160.0 lb

## 2013-02-17 DIAGNOSIS — K5090 Crohn's disease, unspecified, without complications: Secondary | ICD-10-CM

## 2013-02-17 DIAGNOSIS — Z87891 Personal history of nicotine dependence: Secondary | ICD-10-CM | POA: Insufficient documentation

## 2013-02-17 DIAGNOSIS — Z860100 Personal history of colon polyps, unspecified: Secondary | ICD-10-CM | POA: Insufficient documentation

## 2013-02-17 DIAGNOSIS — Z1211 Encounter for screening for malignant neoplasm of colon: Secondary | ICD-10-CM

## 2013-02-17 DIAGNOSIS — E785 Hyperlipidemia, unspecified: Secondary | ICD-10-CM | POA: Insufficient documentation

## 2013-02-17 DIAGNOSIS — K219 Gastro-esophageal reflux disease without esophagitis: Secondary | ICD-10-CM | POA: Insufficient documentation

## 2013-02-17 DIAGNOSIS — R1110 Vomiting, unspecified: Secondary | ICD-10-CM | POA: Insufficient documentation

## 2013-02-17 DIAGNOSIS — K56609 Unspecified intestinal obstruction, unspecified as to partial versus complete obstruction: Secondary | ICD-10-CM

## 2013-02-17 DIAGNOSIS — R9389 Abnormal findings on diagnostic imaging of other specified body structures: Secondary | ICD-10-CM

## 2013-02-17 DIAGNOSIS — D126 Benign neoplasm of colon, unspecified: Secondary | ICD-10-CM

## 2013-02-17 DIAGNOSIS — Z79899 Other long term (current) drug therapy: Secondary | ICD-10-CM | POA: Insufficient documentation

## 2013-02-17 DIAGNOSIS — K5000 Crohn's disease of small intestine without complications: Secondary | ICD-10-CM

## 2013-02-17 DIAGNOSIS — Z8546 Personal history of malignant neoplasm of prostate: Secondary | ICD-10-CM | POA: Insufficient documentation

## 2013-02-17 DIAGNOSIS — Z7982 Long term (current) use of aspirin: Secondary | ICD-10-CM | POA: Insufficient documentation

## 2013-02-17 MED ORDER — ONDANSETRON 8 MG PO TBDP
8.0000 mg | ORAL_TABLET | Freq: Once | ORAL | Status: DC
  Filled 2013-02-17: qty 1

## 2013-02-17 MED ORDER — SODIUM CHLORIDE 0.9 % IV SOLN: 500.0000 mL | INTRAVENOUS | Status: DC

## 2013-02-17 NOTE — Progress Notes (Signed)
Pt stable to RR During procedure noted isolated PVCs - BP stable  Coughed large amount sputum- suctioned airway allowed to wake up - drowsy- stable No problems with c/o pain during procedure. Alert talking to RR

## 2013-02-17 NOTE — ED Notes (Signed)
Pt presents with NAD- Pt at [facility] having colonoscopy  at 1630. Coughed up mucous during and after procedure. Concern for aspiration. Per [facility] wheezing to left  Side and rt lower lobe. Albuterol treatment given at MD office amount unknown. EMS given 2nd treatment- spilled in route.

## 2013-02-17 NOTE — Patient Instructions (Addendum)
YOU HAD AN ENDOSCOPIC PROCEDURE TODAY AT [facility]: Refer to the procedure report that was given to you for any specific questions about what was found during the examination.  If the procedure report does not answer your questions, please call your gastroenterologist to clarify.  If you requested that your care partner not be given the details of your procedure findings, then the procedure report has been included in a sealed envelope for you to review at your convenience later.  YOU SHOULD EXPECT: Some feelings of bloating in the abdomen. Passage of more gas than usual.  Walking can help get rid of the air that was put into your GI tract during the procedure and reduce the bloating. If you had a lower endoscopy (such as a colonoscopy or flexible sigmoidoscopy) you may notice spotting of blood in your stool or on the toilet paper. If you underwent a bowel prep for your procedure, then you may not have a normal bowel movement for a few days.  DIET: Your first meal following the procedure should be a light meal and then it is ok to progress to your normal diet.  A half-sandwich or bowl of soup is an example of a good first meal.  Heavy or fried foods are harder to digest and may make you feel nauseous or bloated.  Likewise meals heavy in dairy and vegetables can cause extra gas to form and this can also increase the bloating.  Drink plenty of fluids but you should avoid alcoholic beverages for 24 hours.  ACTIVITY: Your care partner should take you home directly after the procedure.  You should plan to take it easy, moving slowly for the rest of the day.  You can resume normal activity the day after the procedure however you should NOT DRIVE or use heavy machinery for 24 hours (because of the sedation medicines used during the test).    SYMPTOMS TO REPORT IMMEDIATELY: A gastroenterologist can be reached at any hour.  During normal business hours, 8:30 AM to 5:00 PM Monday through Friday,  call [phone].  After hours and on weekends, please call the GI answering service at [phone] who will take a message and have the physician on call contact you.   Following lower endoscopy (colonoscopy or flexible sigmoidoscopy):  Excessive amounts of blood in the stool  Significant tenderness or worsening of abdominal pains  Swelling of the abdomen that is new, acute  Fever of 100F or higher   FOLLOW UP: If any biopsies were taken you will be contacted by phone or by letter within the next 1-3 weeks.  Call your gastroenterologist if you have not heard about the biopsies in 3 weeks.  Our staff will call the home number listed on your records the next business day following your procedure to check on you and address any questions or concerns that you may have at that time regarding the information given to you following your procedure. This is a courtesy call and so if there is no answer at the home number and we have not heard from you through the emergency physician on call, we will assume that you have returned to your regular daily activities without incident.  SIGNATURES/CONFIDENTIALITY: You and/or your care partner have signed paperwork which will be entered into your electronic medical record.  These signatures attest to the fact that that the information above on your After Visit Summary has been reviewed and is understood.  Full responsibility of the confidentiality of  this discharge information lies with you and/or your care-partner.  Polyps, diverticulosis, low residue diet-handouts given  Repeat colonoscopy will depend on pathology.  Continue home medications.   Low-Fiber Diet Fiber is found in fruits, vegetables, and grains. A low-fiber diet restricts fibrous foods that are not digested in the small intestine. A diet containing about 10 grams of fiber is considered low fiber.  PURPOSE  To prevent blockage of a partially obstructed or narrowed gastrointestinal  tract.  To reduce fecal weight and volume.  To slow the movement of feces. WHEN IS THIS DIET USED?  It may be used during the acute phase of Crohn disease, ulcerative colitis, regional enteritis, or diverticulitis.  It may be used if your intestinal or esophageal tubes are narrowing (stenosis).  It may be used as a transitional diet following surgery, injury (trauma), or illness. CHOOSING FOODS Check labels, especially on foods from the starch list. Often times, dietary fiber content is listed on the nutrition facts panel. Please ask your Registered Dietitian if you have questions about specific foods that are related to your condition, especially if the food is not listed on this handout. Breads and Starches  Allowed: White, [place], and pita breads, plain rolls, buns, or sweet rolls, doughnuts, waffles, pancakes, bagels. Plain muffins, biscuits, matzoth. Soda, saltine, graham crackers. Pretzels, rusks, melba toast, zwieback. Cooked cereals: cornmeal, farina, or cream cereals. Dry cereals: refined corn, wheat, rice, and oat cereals (check label). Potatoes prepared any way without skins, refined macaroni, spaghetti, noodles, refined rice.  Avoid: Whole-wheat bread, rolls, and crackers. Multigrains, rye, bran seeds, nuts, or coconut. Cereals containing whole grains, multigrains, bran, coconut, nuts, raisins. Cooked or dry oatmeal. Coarse wheat cereals, granola. Cereals advertised as "high fiber." Potato skins. Whole-grain pasta, wild or brown rice. Popcorn. Vegetables  Allowed: Strained tomato and vegetable juices. Fresh lettuce, cucumber, spinach. Well-cooked or canned: asparagus, bean sprouts, broccoli, cut green beans, cauliflower, pumpkin, beets, mushrooms, yellow squash, tomato, tomato sauce, zucchini, turnips.Keep servings limited to  cup.  Avoid: Fresh, cooked, or canned: artichokes, baked beans, beet greens, Brussels sprouts, corn, kale, legumes, peas, sweet potatoes. Avoid large  servings of any vegetables. Fruit  Allowed: All fruit juices except prune juice. Cooked or canned fruits without skin and seeds: apricots, applesauce, cantaloupe, cherries, grapefruit, grapes, kiwi, mandarin oranges, peaches, pears, fruit cocktail, pineapple, plums, watermelon. Fresh without skin: banana, grapes, cantaloupe, avocado, cherries, pineapple, kiwi, nectarines, peaches, blueberries. Keep servings limited to  cup or 1 piece.  Avoid: Fresh: apples with or without skin, apricots, mangoes, pears, raspberries, strawberries. Prune juice and juices with pulp, stewed or dried prunes. Dried fruits, raisins, dates. Avoid large servings of all fresh fruits. Meat and Protein Substitutes  Allowed: Ground or well-cooked tender beef, ham, veal, lamb, pork, poultry. Eggs, plain cheese. Fish, oysters, shrimp, lobster, other seafood. Liver, organ meats. Smooth nut butters.  Avoid: Tough, fibrous meats with gristle. Chunky nut butter.Cheese with seeds, nuts, or other foods not allowed. Nuts, seeds, legumes, dried peas, beans, lentils. Dairy  Allowed: All milk products except those not allowed.  Avoid: Yogurt or cheese that contains nuts, seeds, or added fruit. Soups and Combination Foods  Allowed: Bouillon, broth, or cream soups made from allowed foods. Any strained soup. Casseroles or mixed dishes made with allowed foods.  Avoid: Soups made from vegetables that are not allowed or that contain other foods not allowed. Desserts and Sweets  Allowed:Plain cakes and cookies, pie made with allowed fruit, pudding, custard, cream pie. Gelatin, fruit, ice, sherbet,  frozen ice pops. Ice cream, ice milk without nuts. Plain hard candy, honey, jelly, molasses, syrup, sugar, chocolate syrup, gumdrops, marshmallows.  Avoid: Desserts, cookies, or candies that contain nuts, peanut butter, dried fruits. Jams, preserves with seeds, marmalade. Fats and Oils  Allowed:Margarine, butter, cream, mayonnaise, salad  oils, plain salad dressings made from allowed foods.  Avoid: Seeds, nuts, olives. Beverages  Allowed: All, except those listed to avoid.  Avoid: Fruit juices with high pulp, prune juice. Condiments  Allowed:Ketchup, mustard, horseradish, vinegar, cream sauce, cheese sauce, cocoa powder. Spices in moderation: allspice, basil, bay leaves, celery powder or leaves, cinnamon, cumin powder, curry powder, ginger, mace, marjoram, onion or garlic powder, oregano, paprika, parsley flakes, ground pepper, rosemary, sage, savory, tarragon, thyme, turmeric.  Avoid: Coconut, pickles. SAMPLE MENU Breakfast   cup orange juice.  1 boiled egg.  1 slice white toast.  Margarine.   cup cornflakes.  1 cup milk.  Beverage. Lunch   cup chicken noodle soup.  2 to 3 oz sliced roast beef.  2 slices white bread.  Mayonnaise.   cup tomato juice.  1 small banana.  Beverage. Dinner  3 oz baked chicken.   cup scalloped potatoes.   cup cooked beets.  White dinner roll.  Margarine.   cup canned peaches.  Beverage. Document Released: 08/17/2001 Document Revised: 10/28/2012 Document Reviewed: 03/14/2011 [facility] Patient Information 2014 [employer].

## 2013-02-17 NOTE — ED Notes (Signed)
Bed: WA17 Expected date:  Expected time:  Means of arrival:  Comments: Post colonoscopy, possible aspiration

## 2013-02-17 NOTE — Progress Notes (Signed)
Ice placed to right hand after IV placed- area bruised and raised.  No c/o pain

## 2013-02-17 NOTE — Progress Notes (Signed)
Called to room to assist during endoscopic procedure.  Patient ID and intended procedure confirmed with present staff. Received instructions for my participation in the procedure from the performing physician.  

## 2013-02-17 NOTE — Progress Notes (Addendum)
At 1754 pt last set of vital signs were taken, pt was disconnected from monitor and pt was sitting up to get dressed when he started coughing and then started vomiting, pt was spitting up thick copious secretions, and some gastric secretions, Charleston Ropes CRNA listened to pt lungs and paged Dr. Olevia Perches, R side was clear L lung sounds were dimished, Jeani Hawking received orders from Dr. Olevia Perches to give albuterol nebulizer treatment,  At 1805 albuterol nebulizer treatment was given,1/2 way through nebulizer treatment pt was stopped to take some deep breaths, pt started coughing and spitting up clear secretions again and then began to vomit large amounts of dark gastric fluids, Dr. Olevia Perches was paged and 911 was called to transfer pt, pt vitals were taken, and validated, pt was stable, pt stated he felt better, EMS arrived and report was given, transfer sheet was given with demographics, three additional IV attempt by Charleston Ropes CRNA were tried before transferred with EMS, R wrist, R forearm, and LAC. Pt was transferred to [facility] by EMS.-adm

## 2013-02-17 NOTE — Telephone Encounter (Signed)
Called pt's wife about worsening left hilar infiltrate on tonight's cxr, worrisome for pneumonia. Please send to [facility] Amoxacillin 530m, #30, 1 PO TID x 10 days.please repeat cxr in 10 days.

## 2013-02-17 NOTE — ED Provider Notes (Signed)
CSN: [id number]     Arrival date & time 02/17/13  1914 History   First MD Initiated Contact with Patient 02/17/13 1930     Chief Complaint  Patient presents with  . Shortness of Breath   (Consider location/radiation/quality/duration/timing/severity/associated sxs/prior Treatment) HPI  Patient reports he had a colonoscopy about 4 PM today to evaluate him for his Crohn's disease. He reports however there was no evidence of Crohn's disease today, they did remove 2 small polyps. They reported to him while they were doing the procedure he coughed up some mucus and gastric contents, then when he was recovering he said and coughed up again white phlegm and "gastric juices him. He was sent to the ED for evaluation. He states he had no cough prior to his colonoscopy. He states he does not have any nausea now. He states he has had nausea and vomiting in the past after anesthetics. He denies any fever, blood in his mucus, shortness of breath, light headedness, or abdominal pain.  PCP Dr Edwin Dada  Past Medical History  Diagnosis Date  . Hyperhomocysteinemia   . Hyperlipidemia   . Small bowel obstruction   . History of adenomatous polyp of colon   . Vitamin B12 deficiency   . Crohn disease   . Prostate cancer   . GERD (gastroesophageal reflux disease)    Past Surgical History  Procedure Laterality Date  . Cholecystectomy    . Terminal ileum resection    . Appendectomy    . Cataract extraction      both eyes   Family History  Problem Relation Age of Onset  . Crohn's disease Mother   . Heart disease Father   . Diabetes Paternal Grandfather   . Colon cancer Neg Hx   . Esophageal cancer Neg Hx   . Rectal cancer Neg Hx   . Stomach cancer Neg Hx    History  Substance Use Topics  . Smoking status: Former Research scientist (life sciences)  . Smokeless tobacco: Never Used  . Alcohol Use: Yes     Comment: occasionally  lives at home Lives with spouse  Review of Systems  All other systems reviewed and are  negative.    Allergies  Review of patient's allergies indicates no known allergies.  Home Medications   Current Outpatient Rx  Name  Route  Sig  Dispense  Refill  . aspirin 81 MG tablet   Oral   Take 81 mg by mouth daily.         . diphenoxylate-atropine (LOMOTIL) 2.5-0.025 MG per tablet   Oral   Take 1 tablet by mouth daily.         . famotidine (PEPCID) 20 MG tablet   Oral   Take 20 mg by mouth at bedtime.         . fenofibrate (TRICOR) 48 MG tablet   Oral   Take 48 mg by mouth daily.         . mercaptopurine (PURINETHOL) 50 MG tablet   Oral   Take 50 mg by mouth daily. Give on an empty stomach 1 hour before or 2 hours after meals. Caution: Chemotherapy.         . Probiotic Product (PROBIOTIC DAILY PO)   Oral   Take 1 tablet by mouth daily.          BP 135/64  Pulse 122  Temp(Src) 98.6 F (37 C) (Oral)  Resp 16  SpO2 96%  Vital signs normal except tachycardia  Physical Exam  Nursing note and vitals reviewed. Constitutional: He is oriented to person, place, and time. He appears well-developed and well-nourished.  Non-toxic appearance. He does not appear ill. No distress.  HENT:  Head: Normocephalic and atraumatic.  Right Ear: External ear normal.  Left Ear: External ear normal.  Nose: Nose normal. No mucosal edema or rhinorrhea.  Mouth/Throat: Oropharynx is clear and moist and mucous membranes are normal. No dental abscesses or uvula swelling.  Eyes: Conjunctivae and EOM are normal. Pupils are equal, round, and reactive to light.  Neck: Normal range of motion and full passive range of motion without pain. Neck supple.  Cardiovascular: Normal rate, regular rhythm and normal heart sounds.  Exam reveals no gallop and no friction rub.   No murmur heard. Pulmonary/Chest: Effort normal and breath sounds normal. No respiratory distress. He has no wheezes. He has no rhonchi. He has no rales. He exhibits no tenderness and no crepitus.  Abdominal: Soft.  Normal appearance and bowel sounds are normal. He exhibits no distension. There is no tenderness. There is no rebound and no guarding.  Musculoskeletal: Normal range of motion. He exhibits no edema and no tenderness.  Moves all extremities well.   Neurological: He is alert and oriented to person, place, and time. He has normal strength. No cranial nerve deficit.  Skin: Skin is warm, dry and intact. No rash noted. No erythema. No pallor.  Psychiatric: He has a normal mood and affect. His speech is normal and behavior is normal. His mood appears not anxious.    ED Course  Procedures (including critical care time)   Pt ambulated, his pulse ox was 122 with pulse ox of 96%. Patient states he felt fine with ambulation. He does not have shortness of breath, cough, any more nausea or vomiting.  At this point if patient did have some aspiration while he was having his procedure he is asymptomatic. This would be a chemical pneumonia and at this point antibiotics are not indicated. He should return if he develops fever, cough, shortness of breath. Otherwise at this point he can be discharged home. His tachycardia could be explained form his colonscopy prep.   Labs Review Labs Reviewed - No data to display Imaging Review Dg Chest 2 View  02/17/2013   CLINICAL DATA:  Shortness of breath  EXAM: CHEST  2 VIEW  COMPARISON:  12/10/2012; 12/09/2012; 03/01/2005; chest CT -03/02/2005  FINDINGS: Grossly unchanged cardiac silhouette and mediastinal contours. Left infrahilar linear heterogeneous opacities have slightly worsened in the interval. Right lower lung heterogeneous opacities are unchanged and favored to be a sequela of prior operative intervention. There is grossly unchanged slightly asymmetric right apical pleural parenchymal thickening. No pleural effusion or pneumothorax. No evidence of edema. Grossly unchanged bones. Post cholecystectomy.  IMPRESSION: Worsening of left infrahilar heterogeneous opacities,  possibly atelectasis or scar, though developing infection is not excluded. A follow-up chest radiograph in 4 to 6 weeks after treatment is recommended to ensure resolution.   Electronically Signed   By: Sandi Mariscal M.D.   On: 02/17/2013 20:30    EKG Interpretation   None       MDM   1. Vomiting     Plan discharge  Rolland Porter, MD, Alanson Aly, MD 02/17/13 2126

## 2013-02-17 NOTE — ED Notes (Signed)
Family at bedside. 

## 2013-02-17 NOTE — Op Note (Signed)
[facility]  [address]. [place], [address]   COLONOSCOPY PROCEDURE REPORT  PATIENT: Ricardo Villa, Ricardo Villa  MR#: [id number] BIRTHDATE: 1934/10/30 , 78  yrs. old GENDER: Male ENDOSCOPIST: Lafayette Dragon, MD REFERRED GY:JEHUDJSHF Drema Dallas, M.D. PROCEDURE DATE:  02/17/2013 PROCEDURE:   Colonoscopy with snare polypectomy and Colonoscopy with biopsy First Screening Colonoscopy - Avg.  risk and is 50 yrs.  old or older - No.  Prior Negative Screening - Now for repeat screening. N/A  History of Adenoma - Now for follow-up colonoscopy & has been > or = to 3 yrs.  Yes hx of adenoma.  Has been 3 or more years since last colonoscopy.  Polyps Removed Today? Yes. ASA CLASS:   Class III INDICATIONS:Crohn's disease post terminal ileal resection in 1988 in 1995.  Recent hospitalization for small bowel obstruction to do anastomotic stricture.  Last colonoscopy in July 2011 showed adenomatous polyp in a standing column which was a tubulovillous adenoma. MEDICATIONS: MAC sedation, administered by CRNA and propofol (Diprivan) 249m IV  DESCRIPTION OF PROCEDURE:   After the risks benefits and alternatives of the procedure were thoroughly explained, informed consent was obtained.  A digital rectal exam revealed no abnormalities of the rectum.   The LB [id number] endoscope was introduced through the anus and advanced to the surgical anastomosis. No adverse events experienced.   The quality of the prep was good, using MoviPrep  The instrument was then slowly withdrawn as the colon was fully examined.      COLON FINDINGS: Two polypoid shaped sessile polyps measuring 25 mm in size were found in the ascending colon.it was located at the fold distal to the anastomosis. It was removed piecemeal lies but the base of the polyp was rather wide and the base was not removed. It was cauterized with hot snare there was no significant bleeding from the polypectomy site  A polypectomy  was performed using snare cautery and with a cold snare. the anastomosis itself was ileocolic anastomosis and it appeared of normal with no granulation tissue or any evidence of active Crohn's disease. The actual terminal ileal opening could not be localized I could not see any influx of the bilious material into the right colon multiple biopsies were taken randomly from the anastomosis. Another polyp was found in the sigmoid colon at the level of 20 cm which measured 1 cm in diameter and was semi-pedunculated. It was removed with hot snare The resection was incomplete and the polyp tissue was completely retrieved.   Mild diverticulosis was noted in the sigmoid colon. Retroflexed views revealed no abnormalities. The time to cecum=6 minutes 12 seconds.  Withdrawal time=23.35 minutes 0 seconds.  The scope was withdrawn and the procedure completed. COMPLICATIONS: There were no complications.  ENDOSCOPIC IMPRESSION: 1.   Two sessile polyps measuring 25 mm and 10 mm in size were found in the ascending colon and sigmoid colon polypectomy was performed using snare cautery and with a cold snare , the ascending colon polyp was carpeted and the base could not be removed entirely 2.   Mild diverticulosis was noted in the sigmoid colon  3. no evidence of active Crohn's disease. Status post random biopsies at the anastomosis 4. Status post ileocolic anastomosis  RECOMMENDATIONS: 1.  Await biopsy results 2.  continue low residue diet and watch for obstructive symptoms Recall colonoscopy pending pathology continue home medications   eSigned:  DLafayette Dragon MD 02/17/2013 5:33 PM   cc:   PATIENT NAME:  Ricardo Villa, Ricardo Villa MR#: [id number]

## 2013-02-17 NOTE — Progress Notes (Addendum)
Patient did not have preoperative order for IV antibiotic SSI prophylaxis. [phone])

## 2013-02-18 MED ORDER — AMOXICILLIN 500 MG PO TABS: 500.0000 mg | ORAL_TABLET | Freq: Three times a day (TID) | ORAL | Status: DC

## 2013-02-18 NOTE — Addendum Note (Signed)
Addended by: Larina Bras on: 02/18/2013 08:32 AM   Modules accepted: Orders

## 2013-02-18 NOTE — Telephone Encounter (Signed)
  Follow up Call-  Call back number 02/17/2013  Post procedure Call Back phone  # [phone]  Permission to leave phone message Yes     Patient questions:  Do you have a fever, pain , or abdominal swelling? no Pain Score  0 *  Have you tolerated food without any problems? yes  Have you been able to return to your normal activities? yes  Do you have any questions about your discharge instructions: Diet   no Medications  no Follow up visit  no  Do you have questions or concerns about your Care? no  Actions: * If pain score is 4 or above: No action needed, pain <4.  No fever, pain or SOB.  Pt states he has no difficulty breathing.  I reminded him to pick up his antibiotic from the pharmacy and he states understanding

## 2013-02-18 NOTE — Telephone Encounter (Signed)
I have spoken to patient. He has been advised that Amoxicillin was sent to [facility]. I have also advised that he needs chest xray in our radiology department on 03/01/13. He states that he will do this.

## 2013-02-23 ENCOUNTER — Encounter: Payer: Self-pay | Admitting: *Deleted

## 2013-02-24 ENCOUNTER — Encounter: Payer: Self-pay | Admitting: Internal Medicine

## 2013-03-01 ENCOUNTER — Encounter: Payer: Self-pay | Admitting: Neurology

## 2013-03-01 ENCOUNTER — Ambulatory Visit (INDEPENDENT_AMBULATORY_CARE_PROVIDER_SITE_OTHER)
Admission: RE | Admit: 2013-03-01 | Discharge: 2013-03-01 | Disposition: A | Discharge status: Home / Self Care | Payer: Medicare Other | Source: Ambulatory Visit | Attending: Internal Medicine | Admitting: Internal Medicine

## 2013-03-01 ENCOUNTER — Ambulatory Visit (INDEPENDENT_AMBULATORY_CARE_PROVIDER_SITE_OTHER): Payer: Medicare Other | Admitting: Neurology

## 2013-03-01 VITALS — BP 111/69 | HR 81 | Resp 16 | Ht 71.0 in | Wt 162.0 lb

## 2013-03-01 DIAGNOSIS — R9389 Abnormal findings on diagnostic imaging of other specified body structures: Secondary | ICD-10-CM

## 2013-03-01 DIAGNOSIS — E860 Dehydration: Secondary | ICD-10-CM

## 2013-03-01 DIAGNOSIS — Z8673 Personal history of transient ischemic attack (TIA), and cerebral infarction without residual deficits: Secondary | ICD-10-CM | POA: Insufficient documentation

## 2013-03-01 DIAGNOSIS — G459 Transient cerebral ischemic attack, unspecified: Secondary | ICD-10-CM

## 2013-03-01 DIAGNOSIS — R918 Other nonspecific abnormal finding of lung field: Secondary | ICD-10-CM

## 2013-03-01 HISTORY — DX: Dehydration: E86.0

## 2013-03-01 NOTE — Progress Notes (Signed)
[facility]  Provider:  Larey Seat, M D  Referring Provider: Randa Lynn* Primary Care Physician:  [name], MD  Chief Complaint  Patient presents with  . Follow-up    Rm 11    HPI:  Ricardo Villa is a 78 y.o. male  Is seen here as a referral  from Dr. Drema Dallas for an established patient of Dr. Doy Mince, whom he saw after a TIA in 2006 until 2012.  The patient originally was seen in December 2006 after he had a brief onset of symptoms of left sided numbness and weakness of the leg prominently more weakness than in the arm.  The duration of the spell was about 20 minutes. Normal imaging  and it was decided that the symptoms were consistent with a T. I A.  He did have only mild white matter disease and negative 2-D echo at that time a negative bubble study and negative carotid Dopplers.  He was initially placed on Aggrenox but 2 headaches changed to a combination of aspirin and Plavix.  He was also given Metanex as he was found to have hyperhomocysteinemia.  He has followed yearly with Cecille Rubin, NP and never had another event.  In his interval history would like to mention that the patient was hospitalized October 2014 is acute dehydration after diarrhea related to Crohn's disease. He also had vomiting and severe nausea at the time. Colonoscopy with Dr. Maurene Capes , anastomosis site reviewed, open .       Review of Systems: Out of a complete 14 system review, the patient complains of only the following symptoms, and all other reviewed systems are negative. Of a review of systems the patient and/or his but he does not use tobacco, alcohol or recreational drugs. But he has Crohn's disease, and occasionally hip arthritis problems. Denies any loss of smell, loss of taste, myalgia or chest pain or leg swelling.  History   Social History  . Marital Status: Married    Spouse Name: loretta    Number of Children: 2  . Years of  Education: college   Occupational History  . Retired    Social History Main Topics  . Smoking status: Former Research scientist (life sciences)  . Smokeless tobacco: Never Used  . Alcohol Use: Yes     Comment: occasionally  . Drug Use: No  . Sexual Activity: Not on file   Other Topics Concern  . Not on file   Social History Narrative  . No narrative on file    Family History  Problem Relation Age of Onset  . Crohn's disease Mother   . Heart disease Father   . Stroke Father   . Diabetes Paternal Grandfather   . Colon cancer Neg Hx   . Esophageal cancer Neg Hx   . Rectal cancer Neg Hx   . Stomach cancer Neg Hx     Past Medical History  Diagnosis Date  . Hyperhomocysteinemia   . Hyperlipidemia   . Small bowel obstruction   . History of adenomatous polyp of colon   . Vitamin B12 deficiency   . Crohn disease   . Prostate cancer   . GERD (gastroesophageal reflux disease)   . Diverticulosis     Past Surgical History  Procedure Laterality Date  . Cholecystectomy    . Terminal ileum resection    . Appendectomy    . Cataract extraction      both eyes    Current Outpatient Prescriptions  Medication Sig Dispense Refill  .  amoxicillin (AMOXIL) 500 MG tablet Take 1 tablet (500 mg total) by mouth 3 (three) times daily.  30 tablet  0  . aspirin 81 MG tablet Take 81 mg by mouth daily.      . diphenoxylate-atropine (LOMOTIL) 2.5-0.025 MG per tablet Take 1 tablet by mouth daily.      . famotidine (PEPCID) 20 MG tablet Take 20 mg by mouth at bedtime.      . fenofibrate (TRICOR) 48 MG tablet Take 48 mg by mouth daily.      . mercaptopurine (PURINETHOL) 50 MG tablet Take 50 mg by mouth daily. Give on an empty stomach 1 hour before or 2 hours after meals. Caution: Chemotherapy.      . Probiotic Product (PROBIOTIC DAILY PO) Take 1 tablet by mouth daily.       No current facility-administered medications for this visit.    Allergies as of 03/01/2013  . (No Known Allergies)    Vitals: BP 111/69   Pulse 81  Resp 16  Ht 5' 11"  (1.803 m)  Wt 162 lb (73.483 kg)  BMI 22.60 kg/m2 Last Weight:  Wt Readings from Last 1 Encounters:  03/01/13 162 lb (73.483 kg)   Last Height:   Ht Readings from Last 1 Encounters:  03/01/13 5' 11"  (1.803 m)    Physical exam:  General: The patient is awake, alert and appears not in acute distress. The patient is well groomed. Head: Normocephalic, atraumatic. Neck is supple. Mallampati 2, neck circumference: 15, nasal passage unrestricted, no hearing loss.  Cardiovascular:  Regular rate and rhythm , without  murmurs or carotid bruit, and without distended neck veins. Respiratory: Lungs are clear to auscultation. Skin:  Without evidence of edema, or rash Trunk: BMI is normal , posture.  Neurologic exam : The patient is awake and alert, oriented to place and time.  Memory subjective  described as intact.  MMSE 30-30, FSS 23 , Epworth 7 .  There is a normal attention span & concentration ability. Speech is fluent without   dysarthria, dysphonia or aphasia. Mood and affect are appropriate.he seems to frequently lip smack, not quite a tick.   Cranial nerves: Pupils are equal and briskly reactive to light. Funduscopic exam without  evidence of pallor or edema.  Extraocular movements  in vertical and horizontal planes intact and without nystagmus.  Visual fields by finger perimetry are intact. Hearing to finger rub diminished, he has hearing aids , does not wear them.   Facial sensation intact to fine touch. Facial motor strength is symmetric and tongue and uvula move midline.  Motor exam:   Normal tone and normal muscle bulk and symmetric normal strength in all extremities.  Sensory:  Fine touch, pinprick and vibration were tested in all extremities. Proprioception is  normal.  Coordination: Rapid alternating movements in the fingers/hands is tested and normal. Finger-to-nose maneuver tested and normal without evidence of ataxia, dysmetria or  tremor.  Gait and station: Patient walks without assistive device . Strength within normal limits. Stance is stable and normal. Steps are unfragmented. Romberg testing is  normal.  Deep tendon reflexes: in the  upper and lower extremities are symmetric and intact. Babinski maneuver response is downgoing:    Assessment:  After physical and neurologic examination, review of laboratory studies, imaging, neurophysiology testing and pre-existing records, assessment is  !) remote TIA history .    Plan:  Treatment plan and additional workup : no follow up needed.

## 2013-03-01 NOTE — Patient Instructions (Signed)
Transient Ischemic Attack A transient ischemic attack (TIA) is a "warning stroke" that causes stroke-like symptoms. A TIA does not cause lasting damage to the brain. It is important to know when to get help and what to do to prevent stroke or death.  HOME CARE   Take all medicines exactly as told by your doctor. Understand all your medicine instructions.  You may need to take aspirin or warfarin medicine. Take warfarin exactly as told.  Taking too much or too little warfarin is dangerous. Blood tests must be done as often as told by your doctor. These blood tests help your doctor make sure the amount of warfarin you are taking is right. A PT blood test measures how long it takes for blood to clot. Your PT is used to calculate another value called an INR. Your PT and INR help your doctor adjust your warfarin dosage.  Food can cause problems with warfarin and affect the results of your blood tests. This is true for foods high in vitamin K. Spinach, kale, broccoli, cabbage, collard and turnip greens, brussels sprouts, peas, cauliflower, seaweed, and parsley are high in vitamin K as well as beef and pork liver, green tea, and soybean oil. Eat the same amount of food high in vitamin K. Avoid major changes in your diet. Tell your doctor before changing your diet. Talk to a food specialist (dietitian) if you have questions.  Many medicines can cause problems with warfarin and affect your PT and INR. Tell your doctor about all medicines you take. This includes vitamins and dietary pills (supplements). Be careful with aspirin and medicines that relieve redness, soreness, and puffiness (inflammation). Do not take or stop medicines unless your doctor tells you to.  Warfarin can cause a lot of bruising or bleeding. Hold pressure over cuts for longer than normal. Talk to your doctor about other side effects of warfarin.  Avoid sports or activities that may cause injury or bleeding.  Be careful when you shave,  floss your teeth, or use sharp objects.  Avoid alcoholic drinks or drink very little alcohol while taking warfarin. Tell your doctor if you change how much alcohol you drink.  Tell your dentist and other doctors that you take warfarin before procedures.  Eat 5 or more servings of fruits and vegetables a day.  Follow your diet program as told, if you are given one.  Keep a healthy weight.  Stay active. Try to get at least 30 minutes of activity on most or all days.  Do not smoke.  Limit how much alcohol you drink even if you are not taking warfarin. Moderate alcohol use is:  No more than 2 drinks each day for men.  No more than 1 drink each day for women who are not pregnant.  Stop abusing drugs.  Keep your home safe so you do not fall. Try:  Putting grab bars in the bedroom and bathroom.  Raising toilet seats.  Putting a seat in the shower.  Keep all doctor visits a told. GET HELP IF:  Your personality changes.  You have trouble swallowing.  You are seeing two of everything.  You are dizzy.  You have a fever.  Your skin starts to break down. GET HELP RIGHT AWAY IF:  The symptoms below may be a sign of an emergency. Do not wait to see if the symptoms go away. Call for help (911 in [place] Do not drive yourself to the hospital.  You have sudden weakness or numbness on  the face, arm, or leg (especially on one side of the body).  You have sudden trouble walking or moving your arms or legs.  You have sudden confusion.  You have trouble talking or understanding.  You have sudden trouble seeing in one or both eyes.  You lose your balance or your movements are not smooth.  You have a sudden, severe headache with no known cause.  You have new chest pain or you feel your heart beating in a unsteady way.  You are partly or totally unaware of what is going on around you. MAKE SURE YOU:   Understand these instructions.  Will watch your condition.  Will get  help right away if you are not doing well or get worse. Document Released: 12/05/2007 Document Revised: 02/12/2012 Document Reviewed: 04/20/2009 [facility] Patient Information 2014 [place].

## 2013-03-10 ENCOUNTER — Ambulatory Visit: Payer: Self-pay | Admitting: Neurology

## 2013-03-27 ENCOUNTER — Other Ambulatory Visit: Payer: Self-pay | Admitting: Neurology

## 2013-04-14 ENCOUNTER — Other Ambulatory Visit (INDEPENDENT_AMBULATORY_CARE_PROVIDER_SITE_OTHER): Payer: Medicare Other

## 2013-04-14 ENCOUNTER — Encounter: Payer: Self-pay | Admitting: Internal Medicine

## 2013-04-14 ENCOUNTER — Ambulatory Visit (INDEPENDENT_AMBULATORY_CARE_PROVIDER_SITE_OTHER): Payer: Medicare Other | Admitting: Internal Medicine

## 2013-04-14 VITALS — BP 120/64 | HR 76 | Ht 71.0 in | Wt 162.2 lb

## 2013-04-14 DIAGNOSIS — R197 Diarrhea, unspecified: Secondary | ICD-10-CM

## 2013-04-14 DIAGNOSIS — K5080 Crohn's disease of both small and large intestine without complications: Secondary | ICD-10-CM

## 2013-04-14 DIAGNOSIS — D849 Immunodeficiency, unspecified: Secondary | ICD-10-CM

## 2013-04-14 LAB — COMPREHENSIVE METABOLIC PANEL
ALK PHOS: 35 U/L — AB (ref 39–117)
ALT: 10 U/L (ref 0–53)
AST: 17 U/L (ref 0–37)
Albumin: 3.1 g/dL — ABNORMAL LOW (ref 3.5–5.2)
BUN: 14 mg/dL (ref 6–23)
CO2: 28 mEq/L (ref 19–32)
CREATININE: 1.2 mg/dL (ref 0.4–1.5)
Calcium: 8.9 mg/dL (ref 8.4–10.5)
Chloride: 105 mEq/L (ref 96–112)
GFR: 60.64 mL/min (ref >60.00)
Glucose, Bld: 89 mg/dL (ref 70–99)
Potassium: 4.1 mEq/L (ref 3.5–5.1)
SODIUM: 141 meq/L (ref 135–145)
TOTAL PROTEIN: 6.2 g/dL (ref 6.0–8.3)
Total Bilirubin: 1.1 mg/dL (ref 0.3–1.2)

## 2013-04-14 NOTE — Patient Instructions (Addendum)
Your physician has requested that you go to the basement for the following lab work before leaving today: CBC, IBC, B12, Sed Rate, CMET OV 6 months  CC: Dr Leighton Ruff

## 2013-04-14 NOTE — Progress Notes (Signed)
Ricardo Villa 07-Jul-1934 [id number]  Note: This dictation was prepared with Dragon digital system. Any transcriptional errors that result from this procedure are unintentional.   History of Present Illness:  This is a 79 year old white male with a history of ileal colitis. His last office visit was 01/01/2013. He underwent a screening colonoscopy in 02/17/2013 with findings of 2 adenomatous polyps in the left and right colon. Following colonoscopy, the patient had a coughing episode and was evaluated in the emergency room where a chest x-ray showed left infrahilar opacities which slightly worsened since his hospitalization in October 2014. A followup chest x-ray on 03/02/2003 showed clearing of the inflammatory process in the left hilum. Today, patient is asymptomatic. He had a terminal ileal resection in 1988 and again in 1995 and was hospitalized for a small bowel obstruction in October 2014 resulting in dehydration, renal insufficiency and possible aspiration. He finished a prednisone taper and antibiotics in December 2014. He has gained 2 pounds. He has occasional diarrhea but denies fever, abdominal pain or rectal bleeding    Past Medical History  Diagnosis Date  . Hyperhomocysteinemia   . Hyperlipidemia   . Small bowel obstruction   . History of adenomatous polyp of colon   . Vitamin B12 deficiency   . Crohn disease   . Prostate cancer   . GERD (gastroesophageal reflux disease)   . Diverticulosis   . Dehydration 03/01/2013    Diarrhea due to crohn's disease.     Past Surgical History  Procedure Laterality Date  . Cholecystectomy    . Terminal ileum resection    . Appendectomy    . Cataract extraction      both eyes    No Known Allergies  Family history and social history have been reviewed.  Review of Systems: Diarrhea. Occasional acid reflux  The remainder of the 10 point ROS is negative except as outlined in the H&P  Physical Exam: General Appearance Well  developed, in no distress Eyes  Non icteric  HEENT  Non traumatic, normocephalic  Mouth No lesion, tongue papillated, no cheilosis Neck Supple without adenopathy, thyroid not enlarged, no carotid bruits, no JVD Lungs Clear to auscultation bilaterally COR Normal S1, normal S2, regular rhythm, no murmur, quiet precordium Abdomen soft with few hyperactive bowel sounds. No tenderness. No mass. Liver edge at costal margin Rectal not done Extremities  No pedal edema Skin No lesions Neurological Alert and oriented x 3 Psychological Normal mood and affect  Assessment and Plan:   Problem #1 Stable Crohn's disease of the small and large intestine since 1988. Patient to continue 6-MP 50 mg daily. We will recheck his blood count, metabolic panel, iron studies, B12 and sedimentation rate today.  Problem #2 Recent inflammatory process in the left hilum of the chest which resolved on antibiotics. He probably aspirated during his colonoscopy as there was a pre-existing inflammatory process in the left hilum from a prior hospitalization in October 2014.  Problem #3 History of iron deficiency anemia and heme positive stool. We will recheck his iron levels as well as CBC today. He is to continue on iron supplements daily.  Problem #4 Adenomatous polyps of the colon. A recall colonoscopy would be due in 3 years but we will have to make that decision depending on his general medical condition in 3 years.    [name] 04/14/2013

## 2013-09-04 ENCOUNTER — Other Ambulatory Visit: Payer: Self-pay | Admitting: Internal Medicine

## 2013-09-18 ENCOUNTER — Other Ambulatory Visit: Payer: Self-pay | Admitting: Internal Medicine

## 2013-10-05 ENCOUNTER — Ambulatory Visit (INDEPENDENT_AMBULATORY_CARE_PROVIDER_SITE_OTHER): Payer: Medicare Other | Admitting: Internal Medicine

## 2013-10-05 ENCOUNTER — Encounter: Payer: Self-pay | Admitting: Internal Medicine

## 2013-10-05 ENCOUNTER — Other Ambulatory Visit (INDEPENDENT_AMBULATORY_CARE_PROVIDER_SITE_OTHER): Payer: Medicare Other

## 2013-10-05 VITALS — BP 130/70 | HR 76 | Ht 71.0 in | Wt 166.2 lb

## 2013-10-05 DIAGNOSIS — K5080 Crohn's disease of both small and large intestine without complications: Secondary | ICD-10-CM

## 2013-10-05 DIAGNOSIS — R197 Diarrhea, unspecified: Secondary | ICD-10-CM

## 2013-10-05 DIAGNOSIS — D849 Immunodeficiency, unspecified: Secondary | ICD-10-CM

## 2013-10-05 LAB — CBC WITH DIFFERENTIAL/PLATELET
BASOS ABS: 0 10*3/uL (ref 0.0–0.1)
Basophils Relative: 0.4 % (ref 0.0–3.0)
EOS ABS: 0.1 10*3/uL (ref 0.0–0.7)
Eosinophils Relative: 1.5 % (ref 0.0–5.0)
HEMATOCRIT: 38 % — AB (ref 39.0–52.0)
Hemoglobin: 12.6 g/dL — ABNORMAL LOW (ref 13.0–17.0)
LYMPHS ABS: 1.5 10*3/uL (ref 0.7–4.0)
Lymphocytes Relative: 24.8 % (ref 12.0–46.0)
MCHC: 33.2 g/dL (ref 30.0–36.0)
MCV: 95.7 fl (ref 78.0–100.0)
MONO ABS: 0.6 10*3/uL (ref 0.1–1.0)
MONOS PCT: 9.4 % (ref 3.0–12.0)
NEUTROS ABS: 3.8 10*3/uL (ref 1.4–7.7)
Neutrophils Relative %: 63.9 % (ref 43.0–77.0)
PLATELETS: 238 10*3/uL (ref 150.0–400.0)
RBC: 3.97 Mil/uL — ABNORMAL LOW (ref 4.22–5.81)
RDW: 15.3 % (ref 11.5–15.5)
WBC: 6 10*3/uL (ref 4.0–10.5)

## 2013-10-05 LAB — COMPREHENSIVE METABOLIC PANEL
ALT: 13 U/L (ref 0–53)
AST: 22 U/L (ref 0–37)
Albumin: 3.2 g/dL — ABNORMAL LOW (ref 3.5–5.2)
Alkaline Phosphatase: 36 U/L — ABNORMAL LOW (ref 39–117)
BILIRUBIN TOTAL: 0.9 mg/dL (ref 0.2–1.2)
BUN: 10 mg/dL (ref 6–23)
CO2: 31 meq/L (ref 19–32)
Calcium: 8.6 mg/dL (ref 8.4–10.5)
Chloride: 108 mEq/L (ref 96–112)
Creatinine, Ser: 1.3 mg/dL (ref 0.4–1.5)
GFR: 57.85 mL/min — AB (ref >60.00)
Glucose, Bld: 98 mg/dL (ref 70–99)
Potassium: 3.8 mEq/L (ref 3.5–5.1)
SODIUM: 142 meq/L (ref 135–145)
TOTAL PROTEIN: 5.8 g/dL — AB (ref 6.0–8.3)

## 2013-10-05 LAB — VITAMIN B12: VITAMIN B 12: 248 pg/mL (ref 211–911)

## 2013-10-05 LAB — IBC PANEL
Iron: 155 ug/dL (ref 42–165)
Saturation Ratios: 34.4 % (ref 20.0–50.0)
Transferrin: 322.2 mg/dL (ref 212.0–360.0)

## 2013-10-05 NOTE — Patient Instructions (Signed)
Your physician has requested that you go to the basement for the following lab work before leaving today: CBC, IBC, CMET, B12  Continue sublingual B12 x 6 months. We will recheck your B12 after 6 months.  CC:Dr Leighton Ruff

## 2013-10-05 NOTE — Progress Notes (Signed)
Ricardo Villa Jul 27, 1934 [id number]  Note: This dictation was prepared with Dragon digital system. Any transcriptional errors that result from this procedure are unintentional.   History of Present Illness:  This is a 79 year old white male with Crohn's ileocolitis. He is post terminal ileal resection in 1988 and again in 1995. He has a history of small bowel obstruction in October 2014 which resolved on bowel rest and steroids. His last colonoscopy in December 2014 showed 2 adenomatous polyps. He had a respiratory infection after colonoscopy, most likely from aspiration. He is doing well. He has gained 4 pounds since his last office visit in February 2015. He denies diarrhea as long as he takes Lomotil once a day. He denies abdominal pain or any episodes of obstruction.    Past Medical History  Diagnosis Date  . Hyperhomocysteinemia   . Hyperlipidemia   . Small bowel obstruction   . History of adenomatous polyp of colon   . Vitamin B12 deficiency   . Crohn disease   . Prostate cancer   . GERD (gastroesophageal reflux disease)   . Diverticulosis   . Dehydration 03/01/2013    Diarrhea due to crohn's disease.     Past Surgical History  Procedure Laterality Date  . Cholecystectomy    . Terminal ileum resection    . Appendectomy    . Cataract extraction      both eyes    No Known Allergies  Family history and social history have been reviewed.  Review of Systems: Negative for heartburn chest pain rectal bleeding  The remainder of the 10 point ROS is negative except as outlined in the H&P  Physical Exam: General Appearance Well developed, in no distress Eyes  Non icteric  HEENT  Non traumatic, normocephalic  Mouth No lesion, tongue papillated, no cheilosis Neck Supple without adenopathy, thyroid not enlarged, no carotid bruits, no JVD Lungs Clear to auscultation bilaterally COR Normal S1, normal S2, regular rhythm, no murmur, quiet precordium Abdomen soft,  well-healed surgical scars, mild tympany. No tenderness Rectal soft dark Hemoccult negative stool Extremities  No pedal edema Skin No lesions Neurological Alert and oriented x 3 Psychological Normal mood and affect  Assessment and Plan:   Problem #46 79 year old white male with Crohn's ileocolitis. Patient is doing well on 6 MP 50 mg daily. We will be rechecking his blood count, B12 and iron studies. He would like to try sublingual B12 which is over-the-counter.instead of SQ B12. I will see him in one year.    [name] 10/05/2013

## 2013-10-06 ENCOUNTER — Other Ambulatory Visit: Payer: Self-pay | Admitting: *Deleted

## 2013-10-06 DIAGNOSIS — E538 Deficiency of other specified B group vitamins: Secondary | ICD-10-CM

## 2013-10-23 ENCOUNTER — Other Ambulatory Visit: Payer: Self-pay | Admitting: Neurology

## 2013-10-25 NOTE — Telephone Encounter (Signed)
We have been prescribing this drug since 2012 per [facility] records

## 2013-11-08 ENCOUNTER — Other Ambulatory Visit: Payer: Self-pay | Admitting: *Deleted

## 2013-11-08 MED ORDER — DIPHENOXYLATE-ATROPINE 2.5-0.025 MG PO TABS: ORAL_TABLET | ORAL | Status: DC

## 2013-11-16 ENCOUNTER — Other Ambulatory Visit: Payer: Self-pay | Admitting: *Deleted

## 2013-11-16 MED ORDER — MERCAPTOPURINE 50 MG PO TABS: 50.0000 mg | ORAL_TABLET | Freq: Every day | ORAL | Status: DC

## 2014-02-19 ENCOUNTER — Other Ambulatory Visit: Payer: Self-pay | Admitting: Internal Medicine

## 2014-03-16 ENCOUNTER — Inpatient Hospital Stay (HOSPITAL_COMMUNITY)
Admission: EM | Admit: 2014-03-16 | Discharge: 2014-03-21 | DRG: 388 | Disposition: A | Discharge status: Home / Self Care | Payer: Medicare Other | Attending: Internal Medicine | Admitting: Internal Medicine

## 2014-03-16 ENCOUNTER — Emergency Department (HOSPITAL_COMMUNITY): Payer: Medicare Other

## 2014-03-16 ENCOUNTER — Encounter (HOSPITAL_COMMUNITY): Payer: Self-pay | Admitting: Family Medicine

## 2014-03-16 DIAGNOSIS — E870 Hyperosmolality and hypernatremia: Secondary | ICD-10-CM | POA: Diagnosis not present

## 2014-03-16 DIAGNOSIS — I959 Hypotension, unspecified: Secondary | ICD-10-CM | POA: Diagnosis present

## 2014-03-16 DIAGNOSIS — R1110 Vomiting, unspecified: Secondary | ICD-10-CM

## 2014-03-16 DIAGNOSIS — K5090 Crohn's disease, unspecified, without complications: Secondary | ICD-10-CM | POA: Diagnosis present

## 2014-03-16 DIAGNOSIS — E785 Hyperlipidemia, unspecified: Secondary | ICD-10-CM | POA: Diagnosis present

## 2014-03-16 DIAGNOSIS — D72829 Elevated white blood cell count, unspecified: Secondary | ICD-10-CM | POA: Diagnosis present

## 2014-03-16 DIAGNOSIS — R739 Hyperglycemia, unspecified: Secondary | ICD-10-CM | POA: Diagnosis present

## 2014-03-16 DIAGNOSIS — Z87891 Personal history of nicotine dependence: Secondary | ICD-10-CM | POA: Diagnosis not present

## 2014-03-16 DIAGNOSIS — K5660 Unspecified intestinal obstruction: Principal | ICD-10-CM | POA: Diagnosis present

## 2014-03-16 DIAGNOSIS — Z7982 Long term (current) use of aspirin: Secondary | ICD-10-CM

## 2014-03-16 DIAGNOSIS — J690 Pneumonitis due to inhalation of food and vomit: Secondary | ICD-10-CM | POA: Diagnosis present

## 2014-03-16 DIAGNOSIS — E876 Hypokalemia: Secondary | ICD-10-CM | POA: Insufficient documentation

## 2014-03-16 DIAGNOSIS — N179 Acute kidney failure, unspecified: Secondary | ICD-10-CM

## 2014-03-16 DIAGNOSIS — K56609 Unspecified intestinal obstruction, unspecified as to partial versus complete obstruction: Secondary | ICD-10-CM | POA: Diagnosis present

## 2014-03-16 DIAGNOSIS — Z0189 Encounter for other specified special examinations: Secondary | ICD-10-CM

## 2014-03-16 DIAGNOSIS — K5669 Other intestinal obstruction: Secondary | ICD-10-CM

## 2014-03-16 HISTORY — DX: Unspecified intestinal obstruction, unspecified as to partial versus complete obstruction: K56.609

## 2014-03-16 HISTORY — DX: Transient cerebral ischemic attack, unspecified: G45.9

## 2014-03-16 HISTORY — DX: Acute kidney failure, unspecified: N17.9

## 2014-03-16 LAB — CBC WITH DIFFERENTIAL/PLATELET
BASOS ABS: 0 10*3/uL (ref 0.0–0.1)
BASOS PCT: 0 % (ref 0–1)
Eosinophils Absolute: 0 10*3/uL (ref 0.0–0.7)
Eosinophils Relative: 0 % (ref 0–5)
HCT: 43.4 % (ref 39.0–52.0)
Hemoglobin: 14.7 g/dL (ref 13.0–17.0)
LYMPHS ABS: 1.6 10*3/uL (ref 0.7–4.0)
LYMPHS PCT: 9 % — AB (ref 12–46)
MCH: 31.5 pg (ref 26.0–34.0)
MCHC: 33.9 g/dL (ref 30.0–36.0)
MCV: 92.9 fL (ref 78.0–100.0)
MONOS PCT: 7 % (ref 3–12)
Monocytes Absolute: 1.3 10*3/uL — ABNORMAL HIGH (ref 0.1–1.0)
NEUTROS ABS: 15.9 10*3/uL — AB (ref 1.7–7.7)
NEUTROS PCT: 84 % — AB (ref 43–77)
PLATELETS: 320 10*3/uL (ref 150–400)
RBC: 4.67 MIL/uL (ref 4.22–5.81)
RDW: 14.7 % (ref 11.5–15.5)
WBC: 18.9 10*3/uL — ABNORMAL HIGH (ref 4.0–10.5)

## 2014-03-16 LAB — URINALYSIS, ROUTINE W REFLEX MICROSCOPIC
GLUCOSE, UA: NEGATIVE mg/dL
HGB URINE DIPSTICK: NEGATIVE
KETONES UR: 15 mg/dL — AB
NITRITE: POSITIVE — AB
PH: 5 (ref 5.0–8.0)
PROTEIN: NEGATIVE mg/dL
Specific Gravity, Urine: 1.036 — ABNORMAL HIGH (ref 1.005–1.030)
Urobilinogen, UA: 1 mg/dL (ref 0.0–1.0)

## 2014-03-16 LAB — COMPREHENSIVE METABOLIC PANEL
ALBUMIN: 3.1 g/dL — AB (ref 3.5–5.2)
ALT: 26 U/L (ref 0–53)
ANION GAP: 14 (ref 5–15)
AST: 50 U/L — ABNORMAL HIGH (ref 0–37)
Alkaline Phosphatase: 59 U/L (ref 39–117)
BUN: 24 mg/dL — AB (ref 6–23)
CHLORIDE: 105 meq/L (ref 96–112)
CO2: 25 mmol/L (ref 19–32)
Calcium: 9.5 mg/dL (ref 8.4–10.5)
Creatinine, Ser: 2.49 mg/dL — ABNORMAL HIGH (ref 0.50–1.35)
GFR calc Af Amer: 27 mL/min — ABNORMAL LOW (ref >90)
GFR calc non Af Amer: 23 mL/min — ABNORMAL LOW (ref >90)
Glucose, Bld: 161 mg/dL — ABNORMAL HIGH (ref 70–99)
POTASSIUM: 4.2 mmol/L (ref 3.5–5.1)
Sodium: 144 mmol/L (ref 135–145)
Total Bilirubin: 1.6 mg/dL — ABNORMAL HIGH (ref 0.3–1.2)
Total Protein: 6.7 g/dL (ref 6.0–8.3)

## 2014-03-16 LAB — URINE MICROSCOPIC-ADD ON

## 2014-03-16 LAB — LIPASE, BLOOD: Lipase: 21 U/L (ref 11–59)

## 2014-03-16 MED ORDER — SODIUM CHLORIDE 0.9 % IV BOLUS (SEPSIS)
1000.0000 mL | Freq: Once | INTRAVENOUS | Status: AC
  Administered 2014-03-16: 1000 mL via INTRAVENOUS

## 2014-03-16 MED ORDER — CEFTRIAXONE SODIUM 1 G IJ SOLR
1.0000 g | Freq: Once | INTRAMUSCULAR | Status: AC
  Administered 2014-03-16: 1 g via INTRAVENOUS
  Filled 2014-03-16: qty 10

## 2014-03-16 MED ORDER — MORPHINE SULFATE 2 MG/ML IJ SOLN: 1.0000 mg | INTRAMUSCULAR | Status: DC | PRN

## 2014-03-16 MED ORDER — ONDANSETRON HCL 4 MG/2ML IJ SOLN
4.0000 mg | Freq: Once | INTRAMUSCULAR | Status: AC
  Administered 2014-03-16: 4 mg via INTRAVENOUS
  Filled 2014-03-16: qty 2

## 2014-03-16 NOTE — ED Provider Notes (Signed)
CSN: [id number]     Arrival date & time 03/16/14  1755 History   First MD Initiated Contact with Patient 03/16/14 1809     Chief Complaint  Patient presents with  . Nausea  . Emesis     (Consider location/radiation/quality/duration/timing/severity/associated sxs/prior Treatment) Patient is a 80 y.o. male presenting with vomiting.  Emesis Severity:  Severe Duration:  1 day Timing:  Intermittent Quality:  Malodorous material Able to tolerate:  Liquids Progression:  Worsening Chronicity:  Recurrent Recent urination:  Normal Worsened by:  Nothing tried Ineffective treatments:  None tried Associated symptoms: no abdominal pain, no arthralgias, no chills, no diarrhea, no fever, no headaches, no sore throat and no URI     Past Medical History  Diagnosis Date  . Hyperhomocysteinemia   . Hyperlipidemia   . Small bowel obstruction   . History of adenomatous polyp of colon   . Vitamin B12 deficiency   . Crohn disease   . Prostate cancer   . GERD (gastroesophageal reflux disease)   . Diverticulosis   . Dehydration 03/01/2013    Diarrhea due to crohn's disease.    Past Surgical History  Procedure Laterality Date  . Cholecystectomy    . Terminal ileum resection    . Appendectomy    . Cataract extraction      both eyes   Family History  Problem Relation Age of Onset  . Crohn's disease Mother   . Heart disease Father   . Stroke Father   . Diabetes Paternal Grandfather   . Colon cancer Neg Hx   . Esophageal cancer Neg Hx   . Rectal cancer Neg Hx   . Stomach cancer Neg Hx    History  Substance Use Topics  . Smoking status: Former Research scientist (life sciences)  . Smokeless tobacco: Never Used  . Alcohol Use: Yes     Comment: occasionally    Review of Systems  Constitutional: Negative for fever and chills.  HENT: Negative for congestion, rhinorrhea and sore throat.   Eyes: Negative for photophobia and visual disturbance.  Respiratory: Negative for cough and shortness of breath.    Cardiovascular: Negative for chest pain and leg swelling.  Gastrointestinal: Positive for vomiting. Negative for nausea, abdominal pain, diarrhea and constipation.  Endocrine: Negative for polydipsia and polyuria.  Genitourinary: Negative for dysuria and hematuria.  Musculoskeletal: Negative for back pain and arthralgias.  Skin: Negative for color change and rash.  Neurological: Negative for dizziness, syncope, light-headedness and headaches.  Hematological: Negative for adenopathy. Does not bruise/bleed easily.  All other systems reviewed and are negative.     Allergies  Review of patient's allergies indicates no known allergies.  Home Medications   Prior to Admission medications   Medication Sig Start Date End Date Taking? Authorizing Provider  aspirin 81 MG tablet Take 81 mg by mouth daily.   Yes Historical Provider, MD  diphenoxylate-atropine (LOMOTIL) 2.5-0.025 MG per tablet TAKE ONE TABLET 1 OR 2 TIMES DAILY AS NEEDED Patient taking differently: Take 1 tablet by mouth 2 (two) times daily as needed. TAKE ONE TABLET 1 OR 2 TIMES DAILY AS NEEDED 11/08/13  Yes Lafayette Dragon, MD  fenofibrate (TRICOR) 48 MG tablet TAKE ONE TABLET EACH DAY 10/25/13  Yes Asencion Partridge Dohmeier, MD  mercaptopurine (PURINETHOL) 50 MG tablet TAKE 1 TABLET DAILY. GIVE ON AN EMPTY STOMACH 1 HOUR BEFORE OR 2 HOURS AFTER MEALS. CAUTION: CHEMOTHERAPY. 02/21/14  Yes Lafayette Dragon, MD  Probiotic Product (PROBIOTIC DAILY PO) Take 1 tablet by mouth daily.  Yes Historical Provider, MD   BP 126/62 mmHg  Pulse 109  Temp(Src) 97.6 F (36.4 C) (Oral)  Resp 20  SpO2 98% Physical Exam  Constitutional: He is oriented to person, place, and time. He appears well-developed and well-nourished.  HENT:  Head: Normocephalic and atraumatic.  Eyes: Conjunctivae and EOM are normal.  Neck: Normal range of motion. Neck supple.  Cardiovascular: Regular rhythm and normal heart sounds.  Tachycardia present.   Pulmonary/Chest: Effort  normal and breath sounds normal. No respiratory distress.  Abdominal: He exhibits no distension. There is no tenderness. There is no rebound and no guarding.  Musculoskeletal: Normal range of motion.  Neurological: He is alert and oriented to person, place, and time.  Skin: Skin is warm and dry.  Vitals reviewed.   ED Course  Procedures (including critical care time) Labs Review Labs Reviewed  CBC WITH DIFFERENTIAL - Abnormal; Notable for the following:    WBC 18.9 (*)    Neutrophils Relative % 84 (*)    Neutro Abs 15.9 (*)    Lymphocytes Relative 9 (*)    Monocytes Absolute 1.3 (*)    All other components within normal limits  COMPREHENSIVE METABOLIC PANEL - Abnormal; Notable for the following:    Glucose, Bld 161 (*)    BUN 24 (*)    Creatinine, Ser 2.49 (*)    Albumin 3.1 (*)    AST 50 (*)    Total Bilirubin 1.6 (*)    GFR calc non Af Amer 23 (*)    GFR calc Af Amer 27 (*)    All other components within normal limits  URINALYSIS, ROUTINE W REFLEX MICROSCOPIC - Abnormal; Notable for the following:    Color, Urine ORANGE (*)    APPearance CLOUDY (*)    Specific Gravity, Urine 1.036 (*)    Bilirubin Urine LARGE (*)    Ketones, ur 15 (*)    Nitrite POSITIVE (*)    Leukocytes, UA SMALL (*)    All other components within normal limits  URINE MICROSCOPIC-ADD ON - Abnormal; Notable for the following:    Bacteria, UA FEW (*)    Casts HYALINE CASTS (*)    Crystals CA OXALATE CRYSTALS (*)    All other components within normal limits  LIPASE, BLOOD  LACTIC ACID, PLASMA    Imaging Review Ct Abdomen Pelvis Wo Contrast  03/16/2014   CLINICAL DATA:  Nausea and vomiting since 2 a.m. History of Crohn's disease. Surgery for Crohn's. Cholecystectomy. Study ordered without oral or IV contrast material. creatinine 2.49.  EXAM: CT ABDOMEN AND PELVIS WITHOUT CONTRAST  TECHNIQUE: Multidetector CT imaging of the abdomen and pelvis was performed following the standard protocol without IV  contrast.  COMPARISON:  12/09/2012  FINDINGS: Patchy airspace disease demonstrated in the lower lungs and inferior portion of the right middle lobe and left lingula. This may indicate edema or pneumonia. Similar findings were present previously. No pleural effusions. Small esophageal hiatal hernia.  Surgical absence of the gallbladder. No bile duct dilatation. The unenhanced appearance of the liver, spleen, pancreas, adrenal glands, inferior vena cava, and retroperitoneal lymph nodes is unremarkable. Calcification of the abdominal aorta without aneurysm. Nonobstructing stone in the lower pole right kidney measuring 4.6 mm diameter. No hydronephrosis in either kidney. No ureteral stones identified. Postoperative changes with right colon resection and ileal ascending colonic anastomosis. There is also to anastomosis in the distal small bowel. Moderately dilated fluid in gas-filled small bowel loops are present with transition point in the  right lower quadrant suggesting obstruction. The transition zone appears to be a segmental area of narrowing likely representing chronic stricture. Pattern is similar to the previous study. Multiple calcified objects are present throughout the small bowel consistent with ingested material, possibly bones. Interestingly, similar changes were present previously. The structures may be persistent since the previous study, indicating poor transit or the patient may be ingesting similar substances today as at the prior time. There is no acute wall thickening or edema in the small bowel. Colon is stool filled without significant distention. No significant wall thickening in the colon to suggest acute inflammatory process. There scattered lymph nodes in the mesentery, likely reactive. No free air or free fluid in the abdomen.  Pelvis: Bladder wall is not thickened. Prostate gland is not enlarged. No free or loculated pelvic fluid collections. No pelvic mass or lymphadenopathy. No  inflammatory changes suggested in the rectosigmoid colon. Degenerative changes in the lumbar spine. No destructive bone lesions present. Focal benign-appearing sclerosis in the right iliac bone.  IMPRESSION: Dilated fluid and gas-filled small bowel with transition zone in the right lower quadrant suggesting small bowel obstruction or possibly dysmotility. Retained calcific material within the small bowel, some of which may be with in a blind pouch. Either the same structures or summer structures were present on the prior study. Correlation with the patient's diet is recommended. Persistent finding of patchy airspace disease in the lower lungs. Small nonobstructing stone in the right kidney.  These results were called by telephone at the time of interpretation on 03/16/2014 at 9:21 pm to Dr. Debby Freiberg , who verbally acknowledged these results.   Electronically Signed   By: Lucienne Capers M.D.   On: 03/16/2014 21:31     EKG Interpretation None      MDM   Final diagnoses:  Vomiting    80 y.o. male with pertinent PMH of crohns dz, prior bowel resection and prior SBO presents with recurrent nausea and vomiting.  Last BM yesterday.  Pt had feculent vomiting on my examination, but was not tachycardic initially.  CT scan obtained and with SBO.  Consulted surgery and medicine for admission.    I have reviewed all laboratory and imaging studies if ordered as above  1. Vomiting   2. SBO     Debby Freiberg, MD 03/17/14 [phone]

## 2014-03-16 NOTE — ED Notes (Signed)
Pt monitored by pulse ox, bp cuff, and 12-lead. 

## 2014-03-16 NOTE — ED Notes (Signed)
MD at bedside. 

## 2014-03-16 NOTE — H&P (Signed)
[facility] History and Physical  LESLIE LANGILLE JJO:[id number] DOB: August 28, 1934 DOA: 03/16/2014  Referring physician: ER physician. PCP: [name], MD   Chief Complaint: Nausea vomiting.  HPI: Ricardo Villa is a 80 y.o. male with history of Crohn's disease presents to the ER because of nausea and vomiting. Patient's symptoms started yesterday with multiple episodes of nausea and vomiting. Patient last bowel movement was today in the afternoon. Denies any abdominal pain until patient came to the ER where he had mild lower quadrant discomfort. CT abdomen and pelvis shows small bowel obstruction with transition point. On-call surgeon Dr. Hulen Skains has been consulted. Patient otherwise denies any chest pain or shortness of breath has been having nonproductive cough for some time. CT also shows airspace disease. Patient has leukocytosis.   Review of Systems: As presented in the history of presenting illness, rest negative.  Past Medical History  Diagnosis Date  . Hyperhomocysteinemia   . Hyperlipidemia   . Small bowel obstruction   . History of adenomatous polyp of colon   . Vitamin B12 deficiency   . Crohn disease   . Prostate cancer   . GERD (gastroesophageal reflux disease)   . Diverticulosis   . Dehydration 03/01/2013    Diarrhea due to crohn's disease.    Past Surgical History  Procedure Laterality Date  . Cholecystectomy    . Terminal ileum resection    . Appendectomy    . Cataract extraction      both eyes   Social History:  reports that he has quit smoking. He has never used smokeless tobacco. He reports that he drinks alcohol. He reports that he does not use illicit drugs. Where does patient live home. Can patient participate in ADLs? Yes.  No Known Allergies  Family History:  Family History  Problem Relation Age of Onset  . Crohn's disease Mother   . Heart disease Father   . Stroke Father   . Diabetes Paternal Grandfather   . Colon cancer  Neg Hx   . Esophageal cancer Neg Hx   . Rectal cancer Neg Hx   . Stomach cancer Neg Hx       Prior to Admission medications   Medication Sig Start Date End Date Taking? Authorizing Provider  aspirin 81 MG tablet Take 81 mg by mouth daily.   Yes Historical Provider, MD  diphenoxylate-atropine (LOMOTIL) 2.5-0.025 MG per tablet TAKE ONE TABLET 1 OR 2 TIMES DAILY AS NEEDED Patient taking differently: Take 1 tablet by mouth 2 (two) times daily as needed. TAKE ONE TABLET 1 OR 2 TIMES DAILY AS NEEDED 11/08/13  Yes Lafayette Dragon, MD  fenofibrate (TRICOR) 48 MG tablet TAKE ONE TABLET EACH DAY 10/25/13  Yes Asencion Partridge Dohmeier, MD  mercaptopurine (PURINETHOL) 50 MG tablet TAKE 1 TABLET DAILY. GIVE ON AN EMPTY STOMACH 1 HOUR BEFORE OR 2 HOURS AFTER MEALS. CAUTION: CHEMOTHERAPY. 02/21/14  Yes Lafayette Dragon, MD  Probiotic Product (PROBIOTIC DAILY PO) Take 1 tablet by mouth daily.   Yes Historical Provider, MD    Physical Exam: Filed Vitals:   03/16/14 2130 03/16/14 2145 03/16/14 2200 03/16/14 2215  BP: 126/60 118/59 120/60 118/59  Pulse: 108 107 109 115  Temp:      TempSrc:      Resp: 26 21 22 24   SpO2: 96% 94% 93% 92%     General:  Moderately built and nourished.  Eyes: Anicteric no pallor.  ENT: No discharge from the ears eyes nose or mouth.  Neck:  No mass felt.  Cardiovascular: S1 and S2 heard.  Respiratory: No rhonchi or crepitations.  Abdomen: Distended nontender bowel sounds not appreciated no guarding or rigidity.  Skin: No rash.  Musculoskeletal: No edema.  Psychiatric: Appears normal.  Neurologic: Alert awake oriented to time place and person. Moves all extremities.  Labs on Admission:  Basic Metabolic Panel:  Recent Labs Lab 03/16/14 1824  NA 144  K 4.2  CL 105  CO2 25  GLUCOSE 161*  BUN 24*  CREATININE 2.49*  CALCIUM 9.5   Liver Function Tests:  Recent Labs Lab 03/16/14 1824  AST 50*  ALT 26  ALKPHOS 59  BILITOT 1.6*  PROT 6.7  ALBUMIN 3.1*     Recent Labs Lab 03/16/14 1824  LIPASE 21   No results for input(s): AMMONIA in the last 168 hours. CBC:  Recent Labs Lab 03/16/14 1824  WBC 18.9*  NEUTROABS 15.9*  HGB 14.7  HCT 43.4  MCV 92.9  PLT 320   Cardiac Enzymes: No results for input(s): CKTOTAL, CKMB, CKMBINDEX, TROPONINI in the last 168 hours.  BNP (last 3 results) No results for input(s): PROBNP in the last 8760 hours. CBG: No results for input(s): GLUCAP in the last 168 hours.  Radiological Exams on Admission: Ct Abdomen Pelvis Wo Contrast  03/16/2014   CLINICAL DATA:  Nausea and vomiting since 2 a.m. History of Crohn's disease. Surgery for Crohn's. Cholecystectomy. Study ordered without oral or IV contrast material. creatinine 2.49.  EXAM: CT ABDOMEN AND PELVIS WITHOUT CONTRAST  TECHNIQUE: Multidetector CT imaging of the abdomen and pelvis was performed following the standard protocol without IV contrast.  COMPARISON:  12/09/2012  FINDINGS: Patchy airspace disease demonstrated in the lower lungs and inferior portion of the right middle lobe and left lingula. This may indicate edema or pneumonia. Similar findings were present previously. No pleural effusions. Small esophageal hiatal hernia.  Surgical absence of the gallbladder. No bile duct dilatation. The unenhanced appearance of the liver, spleen, pancreas, adrenal glands, inferior vena cava, and retroperitoneal lymph nodes is unremarkable. Calcification of the abdominal aorta without aneurysm. Nonobstructing stone in the lower pole right kidney measuring 4.6 mm diameter. No hydronephrosis in either kidney. No ureteral stones identified. Postoperative changes with right colon resection and ileal ascending colonic anastomosis. There is also to anastomosis in the distal small bowel. Moderately dilated fluid in gas-filled small bowel loops are present with transition point in the right lower quadrant suggesting obstruction. The transition zone appears to be a segmental  area of narrowing likely representing chronic stricture. Pattern is similar to the previous study. Multiple calcified objects are present throughout the small bowel consistent with ingested material, possibly bones. Interestingly, similar changes were present previously. The structures may be persistent since the previous study, indicating poor transit or the patient may be ingesting similar substances today as at the prior time. There is no acute wall thickening or edema in the small bowel. Colon is stool filled without significant distention. No significant wall thickening in the colon to suggest acute inflammatory process. There scattered lymph nodes in the mesentery, likely reactive. No free air or free fluid in the abdomen.  Pelvis: Bladder wall is not thickened. Prostate gland is not enlarged. No free or loculated pelvic fluid collections. No pelvic mass or lymphadenopathy. No inflammatory changes suggested in the rectosigmoid colon. Degenerative changes in the lumbar spine. No destructive bone lesions present. Focal benign-appearing sclerosis in the right iliac bone.  IMPRESSION: Dilated fluid and gas-filled small bowel with transition  zone in the right lower quadrant suggesting small bowel obstruction or possibly dysmotility. Retained calcific material within the small bowel, some of which may be with in a blind pouch. Either the same structures or summer structures were present on the prior study. Correlation with the patient's diet is recommended. Persistent finding of patchy airspace disease in the lower lungs. Small nonobstructing stone in the right kidney.  These results were called by telephone at the time of interpretation on 03/16/2014 at 9:21 pm to Dr. Debby Freiberg , who verbally acknowledged these results.   Electronically Signed   By: Lucienne Capers M.D.   On: 03/16/2014 21:31     Assessment/Plan Principal Problem:   SBO (small bowel obstruction) Active Problems:   Acute renal failure    Leucocytosis   1. Small bowel obstruction - surgery has been consulted. NG tube to be placed for suction. Patient will be kept nothing by mouth. Repeat KUBs in a.m. and further recommendation for surgery. 2. Leukocytosis - probably from aspiration and UA also shows features concerning for UTI. Check urine cultures. At this time I'm placing patient on Unasyn for possible aspiration. 3. Acute renal failure - from nausea and vomiting. Continue hydration. Closely follow metabolic panel intake output. 4. History of Crohn's disease - on mercaptopurine. To be resumed once patient did take orally. 5. Hyperglycemia - check hemoglobin A1c.  Since EKG was showing some lateral ST depression with tachycardia I have ordered troponin but patient has no chest pain.   DVT Prophylaxis SCDs. In anticipation of possible procedures for now have I'm holding off Lovenox.  Code Status: Full code.  Family Communication: Patient's wife at the bedside.  Disposition Plan: Admit to inpatient.    Mariyanna Mucha N. [facility] Pager [phone].  If 7PM-7AM, please contact night-coverage [email or web address] Password [email or web address] 03/16/2014, 10:35 PM

## 2014-03-16 NOTE — ED Notes (Signed)
Admitting MD at BS.  

## 2014-03-16 NOTE — ED Notes (Signed)
Pt here for N,V. sts vomiting up stool. Pt hypotensive at triage. Denise pain

## 2014-03-17 ENCOUNTER — Inpatient Hospital Stay (HOSPITAL_COMMUNITY): Payer: Medicare Other

## 2014-03-17 ENCOUNTER — Encounter (HOSPITAL_COMMUNITY): Payer: Self-pay | Admitting: General Practice

## 2014-03-17 DIAGNOSIS — J690 Pneumonitis due to inhalation of food and vomit: Secondary | ICD-10-CM

## 2014-03-17 DIAGNOSIS — K56609 Unspecified intestinal obstruction, unspecified as to partial versus complete obstruction: Secondary | ICD-10-CM

## 2014-03-17 HISTORY — DX: Unspecified intestinal obstruction, unspecified as to partial versus complete obstruction: K56.609

## 2014-03-17 LAB — CBC WITH DIFFERENTIAL/PLATELET
BASOS ABS: 0 10*3/uL (ref 0.0–0.1)
Basophils Relative: 0 % (ref 0–1)
Eosinophils Absolute: 0 10*3/uL (ref 0.0–0.7)
Eosinophils Relative: 0 % (ref 0–5)
HEMATOCRIT: 35.8 % — AB (ref 39.0–52.0)
Hemoglobin: 11.7 g/dL — ABNORMAL LOW (ref 13.0–17.0)
Lymphocytes Relative: 13 % (ref 12–46)
Lymphs Abs: 1.6 10*3/uL (ref 0.7–4.0)
MCH: 30.6 pg (ref 26.0–34.0)
MCHC: 32.7 g/dL (ref 30.0–36.0)
MCV: 93.7 fL (ref 78.0–100.0)
Monocytes Absolute: 0.8 10*3/uL (ref 0.1–1.0)
Monocytes Relative: 6 % (ref 3–12)
NEUTROS PCT: 80 % — AB (ref 43–77)
Neutro Abs: 9.8 10*3/uL — ABNORMAL HIGH (ref 1.7–7.7)
PLATELETS: 242 10*3/uL (ref 150–400)
RBC: 3.82 MIL/uL — AB (ref 4.22–5.81)
RDW: 14.8 % (ref 11.5–15.5)
WBC: 12.2 10*3/uL — ABNORMAL HIGH (ref 4.0–10.5)

## 2014-03-17 LAB — COMPREHENSIVE METABOLIC PANEL
ALBUMIN: 2.2 g/dL — AB (ref 3.5–5.2)
ALK PHOS: 43 U/L (ref 39–117)
ALT: 16 U/L (ref 0–53)
ANION GAP: 4 — AB (ref 5–15)
AST: 27 U/L (ref 0–37)
BUN: 26 mg/dL — AB (ref 6–23)
CO2: 28 mmol/L (ref 19–32)
Calcium: 7.8 mg/dL — ABNORMAL LOW (ref 8.4–10.5)
Chloride: 111 mEq/L (ref 96–112)
Creatinine, Ser: 1.75 mg/dL — ABNORMAL HIGH (ref 0.50–1.35)
GFR calc Af Amer: 42 mL/min — ABNORMAL LOW (ref >90)
GFR calc non Af Amer: 36 mL/min — ABNORMAL LOW (ref >90)
Glucose, Bld: 148 mg/dL — ABNORMAL HIGH (ref 70–99)
POTASSIUM: 3.4 mmol/L — AB (ref 3.5–5.1)
SODIUM: 143 mmol/L (ref 135–145)
TOTAL PROTEIN: 5 g/dL — AB (ref 6.0–8.3)
Total Bilirubin: 0.7 mg/dL (ref 0.3–1.2)

## 2014-03-17 LAB — GLUCOSE, CAPILLARY
GLUCOSE-CAPILLARY: 145 mg/dL — AB (ref 70–99)
GLUCOSE-CAPILLARY: 149 mg/dL — AB (ref 70–99)
Glucose-Capillary: 121 mg/dL — ABNORMAL HIGH (ref 70–99)
Glucose-Capillary: 166 mg/dL — ABNORMAL HIGH (ref 70–99)

## 2014-03-17 LAB — LACTIC ACID, PLASMA: Lactic Acid, Venous: 1.7 mmol/L (ref 0.5–2.2)

## 2014-03-17 LAB — TROPONIN I: Troponin I: 0.04 ng/mL — ABNORMAL HIGH (ref <0.031)

## 2014-03-17 MED ORDER — SODIUM CHLORIDE 0.9 % IV SOLN
1.5000 g | Freq: Two times a day (BID) | INTRAVENOUS | Status: DC
  Administered 2014-03-17 – 2014-03-18 (×3): 1.5 g via INTRAVENOUS
  Filled 2014-03-17 (×5): qty 1.5

## 2014-03-17 MED ORDER — ONDANSETRON HCL 4 MG/2ML IJ SOLN
4.0000 mg | Freq: Four times a day (QID) | INTRAMUSCULAR | Status: DC | PRN
Start: 2014-03-17 — End: 2014-03-21

## 2014-03-17 MED ORDER — ACETAMINOPHEN 325 MG PO TABS: 650.0000 mg | ORAL_TABLET | Freq: Four times a day (QID) | ORAL | Status: DC | PRN

## 2014-03-17 MED ORDER — ACETAMINOPHEN 650 MG RE SUPP: 650.0000 mg | Freq: Four times a day (QID) | RECTAL | Status: DC | PRN

## 2014-03-17 MED ORDER — METHYLPREDNISOLONE SODIUM SUCC 125 MG IJ SOLR
60.0000 mg | Freq: Every day | INTRAMUSCULAR | Status: DC
  Administered 2014-03-17 – 2014-03-18 (×2): 60 mg via INTRAVENOUS
  Filled 2014-03-17: qty 0.96
  Filled 2014-03-17: qty 2

## 2014-03-17 MED ORDER — DEXTROSE-NACL 5-0.9 % IV SOLN
INTRAVENOUS | Status: AC
  Administered 2014-03-17 (×3): via INTRAVENOUS

## 2014-03-17 MED ORDER — ENOXAPARIN SODIUM 40 MG/0.4ML ~~LOC~~ SOLN
40.0000 mg | SUBCUTANEOUS | Status: DC
  Administered 2014-03-19 – 2014-03-21 (×3): 40 mg via SUBCUTANEOUS
  Filled 2014-03-17 (×4): qty 0.4

## 2014-03-17 MED ORDER — ONDANSETRON HCL 4 MG PO TABS: 4.0000 mg | ORAL_TABLET | Freq: Four times a day (QID) | ORAL | Status: DC | PRN

## 2014-03-17 NOTE — ED Notes (Signed)
Attempted report x1. 

## 2014-03-17 NOTE — Progress Notes (Signed)
Received patient from ED accompanied by wife.  AOx4, ambulatory and VS stable

## 2014-03-17 NOTE — Consult Note (Signed)
Consultation  Referring Provider:      Primary Care Physician:  [name], MD Primary Gastroenterologist:  [name], MD       Reason for Consultation: Crohn's / SBO             HPI:   Ricardo Villa is a 80 y.o. male with a longstanding history of Crohn's ileocolitis, s/p ileal resection 1988 and again in 1995. Maintained on 6MP, followed by Dr. Olevia Perches. Patient presented to ED yesterday with feculent vomiting / hypotension.  Imaging suggests SBO with transition point in RLQ.  Patient began vomiting feculent like material on Monday.  Vomiting persisted through to yesterday. No associated abdominal pain like usual Crohn's flares. Feels better after NGT. Prior to Monday patient felt fine, bowels were moving well. Last BM was yesterday.   Past Medical History  Diagnosis Date  . Hyperhomocysteinemia   . Hyperlipidemia   . Small bowel obstruction   . History of adenomatous polyp of colon   . Vitamin B12 deficiency   . Crohn disease   . Prostate cancer   . GERD (gastroesophageal reflux disease)   . Diverticulosis   . Dehydration 03/01/2013    Diarrhea due to crohn's disease.   Marland Kitchen TIA (transient ischemic attack)     HX OF   . SBO (small bowel obstruction) 03/17/2014    Past Surgical History  Procedure Laterality Date  . Cholecystectomy    . Terminal ileum resection    . Appendectomy    . Cataract extraction      both eyes    Family History  Problem Relation Age of Onset  . Crohn's disease Mother   . Heart disease Father   . Stroke Father   . Diabetes Paternal Grandfather   . Colon cancer Neg Hx   . Esophageal cancer Neg Hx   . Rectal cancer Neg Hx   . Stomach cancer Neg Hx      History  Substance Use Topics  . Smoking status: Former Research scientist (life sciences)  . Smokeless tobacco: Never Used  . Alcohol Use: Yes     Comment: occasionally    Prior to Admission medications   Medication Sig Start Date End Date Taking? Authorizing Provider  aspirin 81 MG tablet  Take 81 mg by mouth daily.   Yes Historical Provider, MD  diphenoxylate-atropine (LOMOTIL) 2.5-0.025 MG per tablet TAKE ONE TABLET 1 OR 2 TIMES DAILY AS NEEDED Patient taking differently: Take 1 tablet by mouth 2 (two) times daily as needed. TAKE ONE TABLET 1 OR 2 TIMES DAILY AS NEEDED 11/08/13  Yes Lafayette Dragon, MD  fenofibrate (TRICOR) 48 MG tablet TAKE ONE TABLET EACH DAY 10/25/13  Yes Asencion Partridge Dohmeier, MD  mercaptopurine (PURINETHOL) 50 MG tablet TAKE 1 TABLET DAILY. GIVE ON AN EMPTY STOMACH 1 HOUR BEFORE OR 2 HOURS AFTER MEALS. CAUTION: CHEMOTHERAPY. 02/21/14  Yes Lafayette Dragon, MD  Probiotic Product (PROBIOTIC DAILY PO) Take 1 tablet by mouth daily.   Yes Historical Provider, MD    Current Facility-Administered Medications  Medication Dose Route Frequency Provider Last Rate Last Dose  . acetaminophen (TYLENOL) tablet 650 mg  650 mg Oral Q6H PRN Rise Patience, MD       Or  . acetaminophen (TYLENOL) suppository 650 mg  650 mg Rectal Q6H PRN Rise Patience, MD      . ampicillin-sulbactam (UNASYN) 1.5 g in sodium chloride 0.9 % 50 mL IVPB  1.5 g Intravenous Q12H Narda Bonds, [facility]  1.5 g at 03/17/14 0326  . dextrose 5 %-0.9 % sodium chloride infusion   Intravenous Continuous Rise Patience, MD 125 mL/hr at 03/17/14 [phone]    . [START ON 03/18/2014] enoxaparin (LOVENOX) injection 40 mg  40 mg Subcutaneous Q24H Shanker Kristeen Mans, MD      . methylPREDNISolone sodium succinate (SOLU-MEDROL) 125 mg/2 mL injection 60 mg  60 mg Intravenous Daily Shanker Kristeen Mans, MD      . morphine 2 MG/ML injection 1 mg  1 mg Intravenous Q4H PRN Rise Patience, MD      . ondansetron [facility]) tablet 4 mg  4 mg Oral Q6H PRN Rise Patience, MD       Or  . ondansetron [facility]) injection 4 mg  4 mg Intravenous Q6H PRN Rise Patience, MD        Allergies as of 03/16/2014  . (No Known Allergies)    Review of Systems:    All systems reviewed and negative except where noted in HPI.    Physical Exam:  Vital signs in last 24 hours: Temp:  [97.6 F (36.4 C)-98.5 F (36.9 C)] 98.5 F (36.9 C) (01/07 0543) Pulse Rate:  [51-116] 107 (01/07 0543) Resp:  [13-27] 21 (01/07 0543) BP: (71-131)/(42-67) 113/58 mmHg (01/07 0543) SpO2:  [90 %-99 %] 91 % (01/07 0543) Last BM Date: 03/16/14 General:   Pleasant white male in NAD. NGT >>>>feculent appearing output. Head:  Normocephalic and atraumatic. Eyes:   No icterus.   Conjunctiva pink. Ears:  Normal auditory acuity. Neck:  Supple; no masses felt Lungs:  Respirations even and unlabored. Lungs clear to auscultation bilaterally.   No wheezes, crackles, or rhonchi.  Heart:  Regular rate and rhythm Abdomen:  Soft, mild-mod distended, tympanitic. Unreliable auscultation given NGT suctioning.  No appreciable masses or hepatomegaly.  Rectal:  Not performed.  Msk:  Symmetrical without gross deformities.  Extremities:  Without edema. Neurologic:  Alert and  oriented x4;  grossly normal neurologically. Skin:  Intact without significant lesions or rashes. Cervical Nodes:  No significant cervical adenopathy. Psych:  Alert and cooperative. Normal affect.  LAB RESULTS:  Recent Labs  03/16/14 1824 03/17/14 0653  WBC 18.9* 12.2*  HGB 14.7 11.7*  HCT 43.4 35.8*  PLT 320 242   BMET  Recent Labs  03/16/14 1824 03/17/14 0653  NA 144 143  K 4.2 3.4*  CL 105 111  CO2 25 28  GLUCOSE 161* 148*  BUN 24* 26*  CREATININE 2.49* 1.75*  CALCIUM 9.5 7.8*   LFT  Recent Labs  03/17/14 0653  PROT 5.0*  ALBUMIN 2.2*  AST 27  ALT 16  ALKPHOS 43  BILITOT 0.7     STUDIES: Ct Abdomen Pelvis Wo Contrast  03/16/2014   CLINICAL DATA:  Nausea and vomiting since 2 a.m. History of Crohn's disease. Surgery for Crohn's. Cholecystectomy. Study ordered without oral or IV contrast material. creatinine 2.49.  EXAM: CT ABDOMEN AND PELVIS WITHOUT CONTRAST  TECHNIQUE: Multidetector CT imaging of the abdomen and pelvis was performed following the  standard protocol without IV contrast.  COMPARISON:  12/09/2012  FINDINGS: Patchy airspace disease demonstrated in the lower lungs and inferior portion of the right middle lobe and left lingula. This may indicate edema or pneumonia. Similar findings were present previously. No pleural effusions. Small esophageal hiatal hernia.  Surgical absence of the gallbladder. No bile duct dilatation. The unenhanced appearance of the liver, spleen, pancreas, adrenal glands, inferior vena cava, and retroperitoneal lymph nodes is  unremarkable. Calcification of the abdominal aorta without aneurysm. Nonobstructing stone in the lower pole right kidney measuring 4.6 mm diameter. No hydronephrosis in either kidney. No ureteral stones identified. Postoperative changes with right colon resection and ileal ascending colonic anastomosis. There is also to anastomosis in the distal small bowel. Moderately dilated fluid in gas-filled small bowel loops are present with transition point in the right lower quadrant suggesting obstruction. The transition zone appears to be a segmental area of narrowing likely representing chronic stricture. Pattern is similar to the previous study. Multiple calcified objects are present throughout the small bowel consistent with ingested material, possibly bones. Interestingly, similar changes were present previously. The structures may be persistent since the previous study, indicating poor transit or the patient may be ingesting similar substances today as at the prior time. There is no acute wall thickening or edema in the small bowel. Colon is stool filled without significant distention. No significant wall thickening in the colon to suggest acute inflammatory process. There scattered lymph nodes in the mesentery, likely reactive. No free air or free fluid in the abdomen.  Pelvis: Bladder wall is not thickened. Prostate gland is not enlarged. No free or loculated pelvic fluid collections. No pelvic mass or  lymphadenopathy. No inflammatory changes suggested in the rectosigmoid colon. Degenerative changes in the lumbar spine. No destructive bone lesions present. Focal benign-appearing sclerosis in the right iliac bone.  IMPRESSION: Dilated fluid and gas-filled small bowel with transition zone in the right lower quadrant suggesting small bowel obstruction or possibly dysmotility. Retained calcific material within the small bowel, some of which may be with in a blind pouch. Either the same structures or summer structures were present on the prior study. Correlation with the patient's diet is recommended. Persistent finding of patchy airspace disease in the lower lungs. Small nonobstructing stone in the right kidney.  These results were called by telephone at the time of interpretation on 03/16/2014 at 9:21 pm to Dr. Debby Freiberg , who verbally acknowledged these results.   Electronically Signed   By: Lucienne Capers M.D.   On: 03/16/2014 21:31   Dg Abd 1 View  03/17/2014   CLINICAL DATA:  NG tube placement  EXAM: ABDOMEN - 1 VIEW  COMPARISON:  12/12/2012  FINDINGS: Enteric tube appears to be coiled in the lower esophagus. Mild gaseous dilatation of central bowel loops. Postoperative changes in the right abdomen. Degenerative changes in the spine.  IMPRESSION: Enteric tube appears to be coiled on the lower esophagus.  These results were called to the floor by the radiology technologist at the time of interpretation on 03/17/2014 at 1:07 am .   Electronically Signed   By: Lucienne Capers M.D.   On: 03/17/2014 01:13   PREVIOUS ENDOSCOPIES:            colonoscopy December 2014, see #4    Impression / Plan:   25. 80 year old male with longstanding history of Crohn's ileocolitis, s/p remote ileal resection and ileocecal resection. Maintained on 6MP 35m daily. No evidence for active Crohn's on last colonoscopy Dec 2014.  2. SBO with transition point in RLQ. WBC down from 18 to 12 today. He feels better with NGT,  +flatus. NGT was repositioned out of esophagus. Output 5749mso far today. Surgery following. Patient started on Solumedrol. No bowel inflammation on CTscan and not having usual flare pain so it is questionable as to whether obstruction secondary to active Crohn's. Anastomotic stricture or adhesions seems more likely but time should  tell. Supportive care for now.   3. AKI, improving with IVF.  4. Suspected aspiration PNA, being treated empirically with antibiotics.   5. History of colon polyps. At time of last colonoscopy Dec 2014 a large tubulovillous adenoma was removed from ascending colon and an adenomatous polyp was removed from sigmoid colon. Base of the large ascending polyp could not be completely removed. Surveillance colonoscopy due December 2017.   Thanks   LOS: 1 day   Tye Savoy  03/17/2014, 12:51 PM      Attending physician's note   I have taken a history, examined the patient and reviewed the chart. I agree with the Advanced Practitioner's note, impression and recommendations. SBO with transition area in RLQ which is the area of prior Crohn's activity and anastomoses. More likely this is a fibrotic stricture or adhesions leading to SBO however agree with Solumedrol in case this is active Crohn's. Continue NG suction. Follow abd films.  Ladene Artist, MD Mar[facility] Regal

## 2014-03-17 NOTE — ED Notes (Signed)
Pt to Radiology.  Yvetta Coder, NT will take pt from radiology to floor.

## 2014-03-17 NOTE — Consult Note (Signed)
Reason for Consult:Bowel obstruction Referring Physician: [name] is an 80 y.o. male.  HPI: 24 hours of nausea and vomiting, no abdominal pain.  Prior history of Crohn's disease and bowel obstruction.  Last admitted with this problem in 2014.  Did not require surgery at that time.    Past Medical History  Diagnosis Date  . Hyperhomocysteinemia   . Hyperlipidemia   . Small bowel obstruction   . History of adenomatous polyp of colon   . Vitamin B12 deficiency   . Crohn disease   . Prostate cancer   . GERD (gastroesophageal reflux disease)   . Diverticulosis   . Dehydration 03/01/2013    Diarrhea due to crohn's disease.     Past Surgical History  Procedure Laterality Date  . Cholecystectomy    . Terminal ileum resection    . Appendectomy    . Cataract extraction      both eyes    Family History  Problem Relation Age of Onset  . Crohn's disease Mother   . Heart disease Father   . Stroke Father   . Diabetes Paternal Grandfather   . Colon cancer Neg Hx   . Esophageal cancer Neg Hx   . Rectal cancer Neg Hx   . Stomach cancer Neg Hx     Social History:  reports that he has quit smoking. He has never used smokeless tobacco. He reports that he drinks alcohol. He reports that he does not use illicit drugs.  Allergies: No Known Allergies  Medications: I have reviewed the patient's current medications.  Results for orders placed or performed during the hospital encounter of 03/16/14 (from the past 48 hour(s))  CBC with Differential     Status: Abnormal   Collection Time: 03/16/14  6:24 PM  Result Value Ref Range   WBC 18.9 (H) 4.0 - 10.5 K/uL   RBC 4.67 4.22 - 5.81 MIL/uL   Hemoglobin 14.7 13.0 - 17.0 g/dL   HCT 43.4 39.0 - 52.0 %   MCV 92.9 78.0 - 100.0 fL   MCH 31.5 26.0 - 34.0 pg   MCHC 33.9 30.0 - 36.0 g/dL   RDW 14.7 11.5 - 15.5 %   Platelets 320 150 - 400 K/uL   Neutrophils Relative % 84 (H) 43 - 77 %   Neutro Abs 15.9 (H) 1.7 - 7.7 K/uL     Lymphocytes Relative 9 (L) 12 - 46 %   Lymphs Abs 1.6 0.7 - 4.0 K/uL   Monocytes Relative 7 3 - 12 %   Monocytes Absolute 1.3 (H) 0.1 - 1.0 K/uL   Eosinophils Relative 0 0 - 5 %   Eosinophils Absolute 0.0 0.0 - 0.7 K/uL   Basophils Relative 0 0 - 1 %   Basophils Absolute 0.0 0.0 - 0.1 K/uL  Comprehensive metabolic panel     Status: Abnormal   Collection Time: 03/16/14  6:24 PM  Result Value Ref Range   Sodium 144 135 - 145 mmol/L    Comment: Please note change in reference range.   Potassium 4.2 3.5 - 5.1 mmol/L    Comment: Please note change in reference range.   Chloride 105 96 - 112 mEq/L   CO2 25 19 - 32 mmol/L   Glucose, Bld 161 (H) 70 - 99 mg/dL   BUN 24 (H) 6 - 23 mg/dL   Creatinine, Ser 2.49 (H) 0.50 - 1.35 mg/dL   Calcium 9.5 8.4 - 10.5 mg/dL   Total Protein 6.7 6.0 -  8.3 g/dL   Albumin 3.1 (L) 3.5 - 5.2 g/dL   AST 50 (H) 0 - 37 U/L   ALT 26 0 - 53 U/L   Alkaline Phosphatase 59 39 - 117 U/L   Total Bilirubin 1.6 (H) 0.3 - 1.2 mg/dL   GFR calc non Af Amer 23 (L) >90 mL/min   GFR calc Af Amer 27 (L) >90 mL/min    Comment: (NOTE) The eGFR has been calculated using the CKD EPI equation. This calculation has not been validated in all clinical situations. eGFR's persistently <90 mL/min signify possible Chronic Kidney Disease.    Anion gap 14 5 - 15  Lipase, blood     Status: None   Collection Time: 03/16/14  6:24 PM  Result Value Ref Range   Lipase 21 11 - 59 U/L  Urinalysis, Routine w reflex microscopic     Status: Abnormal   Collection Time: 03/16/14  8:15 PM  Result Value Ref Range   Color, Urine ORANGE (A) YELLOW    Comment: BIOCHEMICALS MAY BE AFFECTED BY COLOR   APPearance CLOUDY (A) CLEAR   Specific Gravity, Urine 1.036 (H) 1.005 - 1.030   pH 5.0 5.0 - 8.0   Glucose, UA NEGATIVE NEGATIVE mg/dL   Hgb urine dipstick NEGATIVE NEGATIVE   Bilirubin Urine LARGE (A) NEGATIVE   Ketones, ur 15 (A) NEGATIVE mg/dL   Protein, ur NEGATIVE NEGATIVE mg/dL    Urobilinogen, UA 1.0 0.0 - 1.0 mg/dL   Nitrite POSITIVE (A) NEGATIVE   Leukocytes, UA SMALL (A) NEGATIVE  Urine microscopic-add on     Status: Abnormal   Collection Time: 03/16/14  8:15 PM  Result Value Ref Range   Squamous Epithelial / LPF RARE RARE   WBC, UA 3-6 <3 WBC/hpf   Bacteria, UA FEW (A) RARE   Casts HYALINE CASTS (A) NEGATIVE   Crystals CA OXALATE CRYSTALS (A) NEGATIVE   Urine-Other AMORPHOUS URATES/PHOSPHATES     Ct Abdomen Pelvis Wo Contrast  03/16/2014   CLINICAL DATA:  Nausea and vomiting since 2 a.m. History of Crohn's disease. Surgery for Crohn's. Cholecystectomy. Study ordered without oral or IV contrast material. creatinine 2.49.  EXAM: CT ABDOMEN AND PELVIS WITHOUT CONTRAST  TECHNIQUE: Multidetector CT imaging of the abdomen and pelvis was performed following the standard protocol without IV contrast.  COMPARISON:  12/09/2012  FINDINGS: Patchy airspace disease demonstrated in the lower lungs and inferior portion of the right middle lobe and left lingula. This may indicate edema or pneumonia. Similar findings were present previously. No pleural effusions. Small esophageal hiatal hernia.  Surgical absence of the gallbladder. No bile duct dilatation. The unenhanced appearance of the liver, spleen, pancreas, adrenal glands, inferior vena cava, and retroperitoneal lymph nodes is unremarkable. Calcification of the abdominal aorta without aneurysm. Nonobstructing stone in the lower pole right kidney measuring 4.6 mm diameter. No hydronephrosis in either kidney. No ureteral stones identified. Postoperative changes with right colon resection and ileal ascending colonic anastomosis. There is also to anastomosis in the distal small bowel. Moderately dilated fluid in gas-filled small bowel loops are present with transition point in the right lower quadrant suggesting obstruction. The transition zone appears to be a segmental area of narrowing likely representing chronic stricture. Pattern is  similar to the previous study. Multiple calcified objects are present throughout the small bowel consistent with ingested material, possibly bones. Interestingly, similar changes were present previously. The structures may be persistent since the previous study, indicating poor transit or the patient may be ingesting  similar substances today as at the prior time. There is no acute wall thickening or edema in the small bowel. Colon is stool filled without significant distention. No significant wall thickening in the colon to suggest acute inflammatory process. There scattered lymph nodes in the mesentery, likely reactive. No free air or free fluid in the abdomen.  Pelvis: Bladder wall is not thickened. Prostate gland is not enlarged. No free or loculated pelvic fluid collections. No pelvic mass or lymphadenopathy. No inflammatory changes suggested in the rectosigmoid colon. Degenerative changes in the lumbar spine. No destructive bone lesions present. Focal benign-appearing sclerosis in the right iliac bone.  IMPRESSION: Dilated fluid and gas-filled small bowel with transition zone in the right lower quadrant suggesting small bowel obstruction or possibly dysmotility. Retained calcific material within the small bowel, some of which may be with in a blind pouch. Either the same structures or summer structures were present on the prior study. Correlation with the patient's diet is recommended. Persistent finding of patchy airspace disease in the lower lungs. Small nonobstructing stone in the right kidney.  These results were called by telephone at the time of interpretation on 03/16/2014 at 9:21 pm to Dr. Debby Freiberg , who verbally acknowledged these results.   Electronically Signed   By: Lucienne Capers M.D.   On: 03/16/2014 21:31    Review of Systems  Constitutional: Negative for fever and chills.  Gastrointestinal: Positive for nausea and vomiting.  All other systems reviewed and are negative.  Blood  pressure 126/62, pulse 109, temperature 97.6 F (36.4 C), temperature source Oral, resp. rate 20, SpO2 98 %. Physical Exam  Constitutional: He is oriented to person, place, and time. He appears well-developed and well-nourished.  HENT:  Head: Normocephalic and atraumatic.  Eyes: Conjunctivae and EOM are normal.  Cardiovascular: Normal rate, regular rhythm and normal heart sounds.   Respiratory: Effort normal and breath sounds normal.  GI: Soft. He exhibits distension. Bowel sounds are decreased. There is no tenderness.    Patient has an NGT in place which is not draining, question appropriate placement.  Musculoskeletal: Normal range of motion.  Neurological: He is alert and oriented to person, place, and time. He has normal reflexes.  Skin: Skin is warm and dry.    Assessment/Plan: SBO with history of Crohn's disease. NGT in place, X-ray needs to be done. No rush for surgery at this time. IV hydration and decompression appropriate.  [place] Neuroth, JAY 03/17/2014, 12:41 AM

## 2014-03-17 NOTE — Progress Notes (Signed)
ANTIBIOTIC CONSULT NOTE - INITIAL  Pharmacy Consult for Unasyn  Indication: Aspiration PNA  No Known Allergies  Vital Signs: Temp: 98 F (36.7 C) (01/07 0100) Temp Source: Oral (01/07 0100) BP: 131/59 mmHg (01/07 0100) Pulse Rate: 110 (01/07 0100)  Labs:  Recent Labs  03/16/14 1824  WBC 18.9*  HGB 14.7  PLT 320  CREATININE 2.49*    Medical History: Past Medical History  Diagnosis Date  . Hyperhomocysteinemia   . Hyperlipidemia   . Small bowel obstruction   . History of adenomatous polyp of colon   . Vitamin B12 deficiency   . Crohn disease   . Prostate cancer   . GERD (gastroesophageal reflux disease)   . Diverticulosis   . Dehydration 03/01/2013    Diarrhea due to crohn's disease.    Assessment: Starting Unasyn for aspiration PNA. WBC elelvated, noted renal dysfunction, other labs as above.   Plan:  -Unasyn 1.5g IV q12h -Trend WBC, temp, renal function   Narda Bonds 03/17/2014,1:43 AM

## 2014-03-17 NOTE — Progress Notes (Signed)
PATIENT DETAILS Name: Ricardo Villa Age: 80 y.o. Sex: male Date of Birth: April 20, 1934 Admit Date: 03/16/2014 Admitting Physician Rise Patience, MD NOB:[id number] Nicole Kindred, MD  Brief narrative  Patient is a 80 year old male with Crohn's disease on 6 MP, admitted for a bowel obstruction. Also with acute renal failure from prerenal azotemia due to vomiting.   Subjective: No further vomiting, NG tube in place. Passing flatus, no BM today  Assessment/Plan: Principal Problem:   SBO (small bowel obstruction): Likely secondary to stricture from underlying Crohn's disease. Will start IV Solu-Medrol, continue NG tube. Have consulted GI. General surgery following as well. Continue supportive care.  Active Problems:   Acute renal failure: Suspect prerenal azotemia due to vomiting. Creatinine decreasing with IV fluids. Follow lytes.    Suspected aspiration pneumonia: Started on Unasyn on on admission. Looks nontoxic, is afebrile. Will empirically continue with antibiotics for at least 5-7 days.     History of Crohn's disease: Given patient is nothing by mouth, 6 MP on hold. Have started Solu-Medrol. Await GI input.  Disposition: Remain inpatient  Antibiotics:  See below   Anti-infectives    Start     Dose/Rate Route Frequency Ordered Stop   03/17/14 0200  ampicillin-sulbactam (UNASYN) 1.5 g in sodium chloride 0.9 % 50 mL IVPB     1.5 g100 mL/hr over 30 Minutes Intravenous Every 12 hours 03/17/14 0145     03/16/14 2145  cefTRIAXone (ROCEPHIN) 1 g in dextrose 5 % 50 mL IVPB     1 g100 mL/hr over 30 Minutes Intravenous  Once 03/16/14 2139 03/16/14 2255      DVT Prophylaxis: Prophylactic Lovenox  Code Status: Full code   Family Communication Spouse at bedside  Procedures:  None  CONSULTS:  GI and general surgery  Time spent 40 minutes-which includes 50% of the time with face-to-face with patient/ family and coordinating care related to the above  assessment and plan.  MEDICATIONS: Scheduled Meds: . ampicillin-sulbactam (UNASYN) IV  1.5 g Intravenous Q12H  . methylPREDNISolone (SOLU-MEDROL) injection  60 mg Intravenous Daily   Continuous Infusions: . dextrose 5 % and 0.9% NaCl 125 mL/hr at 03/17/14 0959   PRN Meds:.acetaminophen **OR** acetaminophen, morphine injection, ondansetron **OR** ondansetron (ZOFRAN) IV    PHYSICAL EXAM: Vital signs in last 24 hours: Filed Vitals:   03/16/14 2345 03/17/14 0030 03/17/14 0100 03/17/14 0543  BP: 126/62 124/62 131/59 113/58  Pulse: 109 108 110 107  Temp:   98 F (36.7 C) 98.5 F (36.9 C)  TempSrc:   Oral Oral  Resp: 20 19 24 21   SpO2: 98% 97% 91% 91%    Weight change:  There were no vitals filed for this visit. There is no weight on file to calculate BMI.   Gen Exam: Awake and alert with clear speech.   Neck: Supple, No JVD.   Chest: B/L Clear.   CVS: S1 S2 Regular, no murmurs.  Abdomen: soft, BS +, non tender, non distended.  Extremities: no edema, lower extremities warm to touch. Neurologic: Non Focal.   Skin: No Rash.   Wounds: N/A.   Intake/Output from previous day:  Intake/Output Summary (Last 24 hours) at 03/17/14 1236 Last data filed at 03/17/14 1039  Gross per 24 hour  Intake 2737.5 ml  Output   1070 ml  Net 1667.5 ml     LAB RESULTS: CBC  Recent Labs Lab 03/16/14 1824 03/17/14 0653  WBC 18.9* 12.2*  HGB 14.7 11.7*  HCT 43.4 35.8*  PLT 320 242  MCV 92.9 93.7  MCH 31.5 30.6  MCHC 33.9 32.7  RDW 14.7 14.8  LYMPHSABS 1.6 1.6  MONOABS 1.3* 0.8  EOSABS 0.0 0.0  BASOSABS 0.0 0.0    Chemistries   Recent Labs Lab 03/16/14 1824 03/17/14 0653  NA 144 143  K 4.2 3.4*  CL 105 111  CO2 25 28  GLUCOSE 161* 148*  BUN 24* 26*  CREATININE 2.49* 1.75*  CALCIUM 9.5 7.8*    CBG:  Recent Labs Lab 03/17/14 0615 03/17/14 1137  GLUCAP 145* 121*    GFR CrCl cannot be calculated (Unknown ideal weight.).  Coagulation profile No results  for input(s): INR, PROTIME in the last 168 hours.  Cardiac Enzymes  Recent Labs Lab 03/17/14 0653  TROPONINI 0.04*    Invalid input(s): POCBNP No results for input(s): DDIMER in the last 72 hours. No results for input(s): HGBA1C in the last 72 hours. No results for input(s): CHOL, HDL, LDLCALC, TRIG, CHOLHDL, LDLDIRECT in the last 72 hours. No results for input(s): TSH, T4TOTAL, T3FREE, THYROIDAB in the last 72 hours.  Invalid input(s): FREET3 No results for input(s): VITAMINB12, FOLATE, FERRITIN, TIBC, IRON, RETICCTPCT in the last 72 hours.  Recent Labs  03/16/14 1824  LIPASE 21    Urine Studies No results for input(s): UHGB, CRYS in the last 72 hours.  Invalid input(s): UACOL, UAPR, USPG, UPH, UTP, UGL, UKET, UBIL, UNIT, UROB, ULEU, UEPI, UWBC, URBC, UBAC, CAST, UCOM, BILUA  MICROBIOLOGY: No results found for this or any previous visit (from the past 240 hour(s)).  RADIOLOGY STUDIES/RESULTS: Ct Abdomen Pelvis Wo Contrast  03/16/2014   CLINICAL DATA:  Nausea and vomiting since 2 a.m. History of Crohn's disease. Surgery for Crohn's. Cholecystectomy. Study ordered without oral or IV contrast material. creatinine 2.49.  EXAM: CT ABDOMEN AND PELVIS WITHOUT CONTRAST  TECHNIQUE: Multidetector CT imaging of the abdomen and pelvis was performed following the standard protocol without IV contrast.  COMPARISON:  12/09/2012  FINDINGS: Patchy airspace disease demonstrated in the lower lungs and inferior portion of the right middle lobe and left lingula. This may indicate edema or pneumonia. Similar findings were present previously. No pleural effusions. Small esophageal hiatal hernia.  Surgical absence of the gallbladder. No bile duct dilatation. The unenhanced appearance of the liver, spleen, pancreas, adrenal glands, inferior vena cava, and retroperitoneal lymph nodes is unremarkable. Calcification of the abdominal aorta without aneurysm. Nonobstructing stone in the lower pole right kidney  measuring 4.6 mm diameter. No hydronephrosis in either kidney. No ureteral stones identified. Postoperative changes with right colon resection and ileal ascending colonic anastomosis. There is also to anastomosis in the distal small bowel. Moderately dilated fluid in gas-filled small bowel loops are present with transition point in the right lower quadrant suggesting obstruction. The transition zone appears to be a segmental area of narrowing likely representing chronic stricture. Pattern is similar to the previous study. Multiple calcified objects are present throughout the small bowel consistent with ingested material, possibly bones. Interestingly, similar changes were present previously. The structures may be persistent since the previous study, indicating poor transit or the patient may be ingesting similar substances today as at the prior time. There is no acute wall thickening or edema in the small bowel. Colon is stool filled without significant distention. No significant wall thickening in the colon to suggest acute inflammatory process. There scattered lymph nodes in the mesentery, likely reactive. No free air or free fluid in the abdomen.  Pelvis: Bladder wall is not thickened. Prostate gland is not enlarged. No free or loculated pelvic fluid collections. No pelvic mass or lymphadenopathy. No inflammatory changes suggested in the rectosigmoid colon. Degenerative changes in the lumbar spine. No destructive bone lesions present. Focal benign-appearing sclerosis in the right iliac bone.  IMPRESSION: Dilated fluid and gas-filled small bowel with transition zone in the right lower quadrant suggesting small bowel obstruction or possibly dysmotility. Retained calcific material within the small bowel, some of which may be with in a blind pouch. Either the same structures or summer structures were present on the prior study. Correlation with the patient's diet is recommended. Persistent finding of patchy airspace  disease in the lower lungs. Small nonobstructing stone in the right kidney.  These results were called by telephone at the time of interpretation on 03/16/2014 at 9:21 pm to Dr. Debby Freiberg , who verbally acknowledged these results.   Electronically Signed   By: Lucienne Capers M.D.   On: 03/16/2014 21:31   Dg Abd 1 View  03/17/2014   CLINICAL DATA:  NG tube placement  EXAM: ABDOMEN - 1 VIEW  COMPARISON:  12/12/2012  FINDINGS: Enteric tube appears to be coiled in the lower esophagus. Mild gaseous dilatation of central bowel loops. Postoperative changes in the right abdomen. Degenerative changes in the spine.  IMPRESSION: Enteric tube appears to be coiled on the lower esophagus.  These results were called to the floor by the radiology technologist at the time of interpretation on 03/17/2014 at 1:07 am .   Electronically Signed   By: Lucienne Capers M.D.   On: 03/17/2014 01:13    Oren Binet, MD  [facility] Pager:[phone]  If 7PM-7AM, please contact night-coverage [email or web address] Password [email or web address] 03/17/2014, 12:36 PM   LOS: 1 day

## 2014-03-17 NOTE — Progress Notes (Signed)
[facility] Progress Note     Subjective: Pt feels much better, but still distended with some pain.  No N/V, NG in place but coiled in esophagus.  Wants to get up to ambulate.  Wife at bedside.    Objective: Vital signs in last 24 hours: Temp:  [97.6 F (36.4 C)-98.5 F (36.9 C)] 98.5 F (36.9 C) (01/07 0543) Pulse Rate:  [51-116] 107 (01/07 0543) Resp:  [13-27] 21 (01/07 0543) BP: (71-131)/(42-67) 113/58 mmHg (01/07 0543) SpO2:  [90 %-99 %] 91 % (01/07 0543) Last BM Date: 03/16/14  Intake/Output from previous day: 01/06 0701 - 01/07 0700 In: 2737.5 [I.V.:2687.5; IV Piggyback:50] Out: 495 [Urine:120; Emesis/NG output:325] Intake/Output this shift:    PE: Gen:  Alert, NAD, pleasant Abd: Soft, distended, mildly tender, +BS, no HSM, well healed abdominal scars noted   Lab Results:   Recent Labs  03/16/14 1824 03/17/14 0653  WBC 18.9* 12.2*  HGB 14.7 11.7*  HCT 43.4 35.8*  PLT 320 242   BMET  Recent Labs  03/16/14 1824 03/17/14 0653  NA 144 143  K 4.2 3.4*  CL 105 111  CO2 25 28  GLUCOSE 161* 148*  BUN 24* 26*  CREATININE 2.49* 1.75*  CALCIUM 9.5 7.8*   PT/INR No results for input(s): LABPROT, INR in the last 72 hours. CMP     Component Value Date/Time   NA 143 03/17/2014 0653   K 3.4* 03/17/2014 0653   CL 111 03/17/2014 0653   CO2 28 03/17/2014 0653   GLUCOSE 148* 03/17/2014 0653   BUN 26* 03/17/2014 0653   CREATININE 1.75* 03/17/2014 0653   CALCIUM 7.8* 03/17/2014 0653   PROT 5.0* 03/17/2014 0653   ALBUMIN 2.2* 03/17/2014 0653   AST 27 03/17/2014 0653   ALT 16 03/17/2014 0653   ALKPHOS 43 03/17/2014 0653   BILITOT 0.7 03/17/2014 0653   GFRNONAA 36* 03/17/2014 0653   GFRAA 42* 03/17/2014 0653   Lipase     Component Value Date/Time   LIPASE 21 03/16/2014 1824       Studies/Results: Ct Abdomen Pelvis Wo Contrast  03/16/2014   CLINICAL DATA:  Nausea and vomiting since 2 a.m. History of Crohn's disease. Surgery for  Crohn's. Cholecystectomy. Study ordered without oral or IV contrast material. creatinine 2.49.  EXAM: CT ABDOMEN AND PELVIS WITHOUT CONTRAST  TECHNIQUE: Multidetector CT imaging of the abdomen and pelvis was performed following the standard protocol without IV contrast.  COMPARISON:  12/09/2012  FINDINGS: Patchy airspace disease demonstrated in the lower lungs and inferior portion of the right middle lobe and left lingula. This may indicate edema or pneumonia. Similar findings were present previously. No pleural effusions. Small esophageal hiatal hernia.  Surgical absence of the gallbladder. No bile duct dilatation. The unenhanced appearance of the liver, spleen, pancreas, adrenal glands, inferior vena cava, and retroperitoneal lymph nodes is unremarkable. Calcification of the abdominal aorta without aneurysm. Nonobstructing stone in the lower pole right kidney measuring 4.6 mm diameter. No hydronephrosis in either kidney. No ureteral stones identified. Postoperative changes with right colon resection and ileal ascending colonic anastomosis. There is also to anastomosis in the distal small bowel. Moderately dilated fluid in gas-filled small bowel loops are present with transition point in the right lower quadrant suggesting obstruction. The transition zone appears to be a segmental area of narrowing likely representing chronic stricture. Pattern is similar to the previous study. Multiple calcified objects are present throughout the small bowel consistent with ingested material, possibly bones. Interestingly, similar  changes were present previously. The structures may be persistent since the previous study, indicating poor transit or the patient may be ingesting similar substances today as at the prior time. There is no acute wall thickening or edema in the small bowel. Colon is stool filled without significant distention. No significant wall thickening in the colon to suggest acute inflammatory process. There  scattered lymph nodes in the mesentery, likely reactive. No free air or free fluid in the abdomen.  Pelvis: Bladder wall is not thickened. Prostate gland is not enlarged. No free or loculated pelvic fluid collections. No pelvic mass or lymphadenopathy. No inflammatory changes suggested in the rectosigmoid colon. Degenerative changes in the lumbar spine. No destructive bone lesions present. Focal benign-appearing sclerosis in the right iliac bone.  IMPRESSION: Dilated fluid and gas-filled small bowel with transition zone in the right lower quadrant suggesting small bowel obstruction or possibly dysmotility. Retained calcific material within the small bowel, some of which may be with in a blind pouch. Either the same structures or summer structures were present on the prior study. Correlation with the patient's diet is recommended. Persistent finding of patchy airspace disease in the lower lungs. Small nonobstructing stone in the right kidney.  These results were called by telephone at the time of interpretation on 03/16/2014 at 9:21 pm to Dr. Debby Freiberg , who verbally acknowledged these results.   Electronically Signed   By: Lucienne Capers M.D.   On: 03/16/2014 21:31   Dg Abd 1 View  03/17/2014   CLINICAL DATA:  NG tube placement  EXAM: ABDOMEN - 1 VIEW  COMPARISON:  12/12/2012  FINDINGS: Enteric tube appears to be coiled in the lower esophagus. Mild gaseous dilatation of central bowel loops. Postoperative changes in the right abdomen. Degenerative changes in the spine.  IMPRESSION: Enteric tube appears to be coiled on the lower esophagus.  These results were called to the floor by the radiology technologist at the time of interpretation on 03/17/2014 at 1:07 am .   Electronically Signed   By: Lucienne Capers M.D.   On: 03/17/2014 01:13    Anti-infectives: Anti-infectives    Start     Dose/Rate Route Frequency Ordered Stop   03/17/14 0200  ampicillin-sulbactam (UNASYN) 1.5 g in sodium chloride 0.9 % 50 mL  IVPB     1.5 g100 mL/hr over 30 Minutes Intravenous Every 12 hours 03/17/14 0145     03/16/14 2145  cefTRIAXone (ROCEPHIN) 1 g in dextrose 5 % 50 mL IVPB     1 g100 mL/hr over 30 Minutes Intravenous  Once 03/16/14 2139 03/16/14 2255       Assessment/Plan SBO with history of Crohn's disease. Leukocytosis - improved to 12.2 AKI - improved Cr. 1.75  Plan: 1.  NGT in place, reposition NG as its coiled in esophagus 2.  No rush for surgery at this time. 3.  IV hydration and decompression appropriate. 4.  Ambulate and IS 5.  SCD's and heparin/lovenox per primary service 6.  Hopefully he will resolve without surgical interventions.    LOS: 1 day    DORT, Jinny Blossom 03/17/2014, 8:15 AM Pager: [phone]

## 2014-03-18 ENCOUNTER — Inpatient Hospital Stay (HOSPITAL_COMMUNITY): Payer: Medicare Other

## 2014-03-18 DIAGNOSIS — K50918 Crohn's disease, unspecified, with other complication: Secondary | ICD-10-CM

## 2014-03-18 DIAGNOSIS — E876 Hypokalemia: Secondary | ICD-10-CM

## 2014-03-18 LAB — BASIC METABOLIC PANEL
Anion gap: 4 — ABNORMAL LOW (ref 5–15)
BUN: 18 mg/dL (ref 6–23)
CALCIUM: 8.1 mg/dL — AB (ref 8.4–10.5)
CHLORIDE: 114 meq/L — AB (ref 96–112)
CO2: 31 mmol/L (ref 19–32)
Creatinine, Ser: 1.38 mg/dL — ABNORMAL HIGH (ref 0.50–1.35)
GFR calc non Af Amer: 48 mL/min — ABNORMAL LOW (ref >90)
GFR, EST AFRICAN AMERICAN: 55 mL/min — AB (ref >90)
Glucose, Bld: 139 mg/dL — ABNORMAL HIGH (ref 70–99)
Potassium: 3.2 mmol/L — ABNORMAL LOW (ref 3.5–5.1)
Sodium: 149 mmol/L — ABNORMAL HIGH (ref 135–145)

## 2014-03-18 LAB — GLUCOSE, CAPILLARY
GLUCOSE-CAPILLARY: 135 mg/dL — AB (ref 70–99)
GLUCOSE-CAPILLARY: 134 mg/dL — AB (ref 70–99)
Glucose-Capillary: 141 mg/dL — ABNORMAL HIGH (ref 70–99)

## 2014-03-18 MED ORDER — METHYLPREDNISOLONE SODIUM SUCC 40 MG IJ SOLR
20.0000 mg | Freq: Every day | INTRAMUSCULAR | Status: DC
  Administered 2014-03-19: 20 mg via INTRAVENOUS
  Filled 2014-03-18 (×2): qty 0.5

## 2014-03-18 MED ORDER — KCL IN DEXTROSE-NACL 40-5-0.45 MEQ/L-%-% IV SOLN
INTRAVENOUS | Status: DC
  Administered 2014-03-18 – 2014-03-19 (×3): via INTRAVENOUS
  Filled 2014-03-18 (×4): qty 1000

## 2014-03-18 MED ORDER — DEXTROSE-NACL 5-0.9 % IV SOLN: INTRAVENOUS | Status: DC

## 2014-03-18 MED ORDER — SODIUM CHLORIDE 0.9 % IV SOLN
1.5000 g | Freq: Four times a day (QID) | INTRAVENOUS | Status: DC
  Administered 2014-03-18 – 2014-03-21 (×12): 1.5 g via INTRAVENOUS
  Filled 2014-03-18 (×14): qty 1.5

## 2014-03-18 NOTE — Progress Notes (Signed)
ANTIBIOTIC CONSULT NOTE - INITIAL  Pharmacy Consult for Unasyn  Indication: Aspiration PNA  No Known Allergies  Vital Signs: Temp: 98.2 F (36.8 C) (01/08 0509) Temp Source: Oral (01/08 0509) BP: 137/79 mmHg (01/08 0509) Pulse Rate: 94 (01/08 0509)  Labs:  Recent Labs  03/16/14 1824 03/17/14 0653 03/18/14 0554  WBC 18.9* 12.2*  --   HGB 14.7 11.7*  --   PLT 320 242  --   CREATININE 2.49* 1.75* 1.38*    Assessment: Starting Unasyn for aspiration PNA. WBC elelvated, noted renal function improving, other labs as above.  He is afebrile, WBC trending down.    Plan:  -Increase Unasyn to 1.5g IV q6h -Trend WBC, temp, renal function   Candie Mile 03/18/2014,10:55 AM

## 2014-03-18 NOTE — Progress Notes (Signed)
[facility] Progress Note     Subjective: Pt says he feels much better, minimal pain, less distension, having good flatus, NG put out >2L in the last 24hr of dark brown bilious output.  He's ambulating well.  Tolerating ice chips.    Objective: Vital signs in last 24 hours: Temp:  [97.8 F (36.6 C)-98.4 F (36.9 C)] 98.2 F (36.8 C) (01/08 0509) Pulse Rate:  [94-98] 94 (01/08 0509) Resp:  [19-20] 19 (01/08 0509) BP: (127-137)/(62-79) 137/79 mmHg (01/08 0509) SpO2:  [91 %-95 %] 91 % (01/08 0509) Last BM Date: 03/16/14  Intake/Output from previous day: 01/07 0701 - 01/08 0700 In: 2961.7 [P.O.:120; I.V.:2791.7; IV Piggyback:50] Out: 3354 [Urine:1210; Emesis/NG output:2225] Intake/Output this shift: Total I/O In: -  Out: 300 [Emesis/NG output:300]  PE: Gen:  Alert, NAD, pleasant Abd: Soft, minimal tenderness, less distended today, +BS, no HSM, abdominal scars noted   Lab Results:   Recent Labs  03/16/14 1824 03/17/14 0653  WBC 18.9* 12.2*  HGB 14.7 11.7*  HCT 43.4 35.8*  PLT 320 242   BMET  Recent Labs  03/17/14 0653 03/18/14 0554  NA 143 149*  K 3.4* 3.2*  CL 111 114*  CO2 28 31  GLUCOSE 148* 139*  BUN 26* 18  CREATININE 1.75* 1.38*  CALCIUM 7.8* 8.1*   PT/INR No results for input(s): LABPROT, INR in the last 72 hours. CMP     Component Value Date/Time   NA 149* 03/18/2014 0554   K 3.2* 03/18/2014 0554   CL 114* 03/18/2014 0554   CO2 31 03/18/2014 0554   GLUCOSE 139* 03/18/2014 0554   BUN 18 03/18/2014 0554   CREATININE 1.38* 03/18/2014 0554   CALCIUM 8.1* 03/18/2014 0554   PROT 5.0* 03/17/2014 0653   ALBUMIN 2.2* 03/17/2014 0653   AST 27 03/17/2014 0653   ALT 16 03/17/2014 0653   ALKPHOS 43 03/17/2014 0653   BILITOT 0.7 03/17/2014 0653   GFRNONAA 48* 03/18/2014 0554   GFRAA 55* 03/18/2014 0554   Lipase     Component Value Date/Time   LIPASE 21 03/16/2014 1824       Studies/Results: Ct Abdomen Pelvis Wo  Contrast  03/16/2014   CLINICAL DATA:  Nausea and vomiting since 2 a.m. History of Crohn's disease. Surgery for Crohn's. Cholecystectomy. Study ordered without oral or IV contrast material. creatinine 2.49.  EXAM: CT ABDOMEN AND PELVIS WITHOUT CONTRAST  TECHNIQUE: Multidetector CT imaging of the abdomen and pelvis was performed following the standard protocol without IV contrast.  COMPARISON:  12/09/2012  FINDINGS: Patchy airspace disease demonstrated in the lower lungs and inferior portion of the right middle lobe and left lingula. This may indicate edema or pneumonia. Similar findings were present previously. No pleural effusions. Small esophageal hiatal hernia.  Surgical absence of the gallbladder. No bile duct dilatation. The unenhanced appearance of the liver, spleen, pancreas, adrenal glands, inferior vena cava, and retroperitoneal lymph nodes is unremarkable. Calcification of the abdominal aorta without aneurysm. Nonobstructing stone in the lower pole right kidney measuring 4.6 mm diameter. No hydronephrosis in either kidney. No ureteral stones identified. Postoperative changes with right colon resection and ileal ascending colonic anastomosis. There is also to anastomosis in the distal small bowel. Moderately dilated fluid in gas-filled small bowel loops are present with transition point in the right lower quadrant suggesting obstruction. The transition zone appears to be a segmental area of narrowing likely representing chronic stricture. Pattern is similar to the previous study. Multiple calcified objects are present throughout  the small bowel consistent with ingested material, possibly bones. Interestingly, similar changes were present previously. The structures may be persistent since the previous study, indicating poor transit or the patient may be ingesting similar substances today as at the prior time. There is no acute wall thickening or edema in the small bowel. Colon is stool filled without  significant distention. No significant wall thickening in the colon to suggest acute inflammatory process. There scattered lymph nodes in the mesentery, likely reactive. No free air or free fluid in the abdomen.  Pelvis: Bladder wall is not thickened. Prostate gland is not enlarged. No free or loculated pelvic fluid collections. No pelvic mass or lymphadenopathy. No inflammatory changes suggested in the rectosigmoid colon. Degenerative changes in the lumbar spine. No destructive bone lesions present. Focal benign-appearing sclerosis in the right iliac bone.  IMPRESSION: Dilated fluid and gas-filled small bowel with transition zone in the right lower quadrant suggesting small bowel obstruction or possibly dysmotility. Retained calcific material within the small bowel, some of which may be with in a blind pouch. Either the same structures or summer structures were present on the prior study. Correlation with the patient's diet is recommended. Persistent finding of patchy airspace disease in the lower lungs. Small nonobstructing stone in the right kidney.  These results were called by telephone at the time of interpretation on 03/16/2014 at 9:21 pm to Dr. Debby Freiberg , who verbally acknowledged these results.   Electronically Signed   By: Lucienne Capers M.D.   On: 03/16/2014 21:31   Dg Abd 1 View  03/17/2014   CLINICAL DATA:  NG tube placement  EXAM: ABDOMEN - 1 VIEW  COMPARISON:  12/12/2012  FINDINGS: Enteric tube appears to be coiled in the lower esophagus. Mild gaseous dilatation of central bowel loops. Postoperative changes in the right abdomen. Degenerative changes in the spine.  IMPRESSION: Enteric tube appears to be coiled on the lower esophagus.  These results were called to the floor by the radiology technologist at the time of interpretation on 03/17/2014 at 1:07 am .   Electronically Signed   By: Lucienne Capers M.D.   On: 03/17/2014 01:13   Dg Abd Portable 1v  03/18/2014   CLINICAL DATA:  Small bowel  obstruction and abdominal pain.  EXAM: PORTABLE ABDOMEN - 1 VIEW  COMPARISON:  03/17/2014  FINDINGS: Nasogastric tube extends into the upper abdomen but the tube is folded on itself and the tip is near the gastric cardia region. There continues to be dilated loops of small bowel in the lower abdomen. Overall, the degree of small bowel distention has slightly decreased. There appears to be increased gas in the colon. Limited evaluation for free air on these supine images. Again noted are calcified structures in the right lower abdomen which are known to be within bowel loops.  IMPRESSION: Persistent dilated loops of small bowel in the abdomen. The degree of small bowel distention has minimally decreased. Slightly increased gas in the colon. Findings could represent a partial small bowel obstruction.  Nasogastric tube as described.   Electronically Signed   By: Markus Daft M.D.   On: 03/18/2014 10:38    Anti-infectives: Anti-infectives    Start     Dose/Rate Route Frequency Ordered Stop   03/18/14 1200  ampicillin-sulbactam (UNASYN) 1.5 g in sodium chloride 0.9 % 50 mL IVPB     1.5 g100 mL/hr over 30 Minutes Intravenous Every 6 hours 03/18/14 1055     03/17/14 0200  ampicillin-sulbactam (UNASYN) 1.5  g in sodium chloride 0.9 % 50 mL IVPB  Status:  Discontinued     1.5 g100 mL/hr over 30 Minutes Intravenous Every 12 hours 03/17/14 0145 03/18/14 1055   03/16/14 2145  cefTRIAXone (ROCEPHIN) 1 g in dextrose 5 % 50 mL IVPB     1 g100 mL/hr over 30 Minutes Intravenous  Once 03/16/14 2139 03/16/14 2255       Assessment/Plan SBO History of Crohn's disease Leukocytosis  AKI   Plan: 1. NGT in place, after it was repositioned it put out >2L in the last 24 hours.  KUB minimally improved, continue NG for now, maybe try clamping trials tomorrow if output down 2. No rush for surgery at this time. 3. IV hydration and decompression appropriate, agree with steroids 4. Ambulate and IS 5. SCD's and  heparin/lovenox per primary service 6. Hopefully he will resolve without surgical interventions.    LOS: 2 days    DORT, Jinny Blossom 03/18/2014, 11:02 AM Pager: [phone]

## 2014-03-18 NOTE — Care Management Note (Signed)
  Page 1 of 1   03/21/2014     10:38:40 AM CARE MANAGEMENT NOTE 03/21/2014  Patient:  Ricardo Villa, Ricardo Villa   Account Number:  [id number]  Date Initiated:  03/18/2014  Documentation initiated by:  Magdalen Spatz  Subjective/Objective Assessment:     Action/Plan:   Anticipated DC Date:  03/21/2014   Anticipated DC Plan:  HOME/SELF CARE         Choice offered to / List presented to:             Status of service:   Medicare Important Message given?  YES (If response is "NO", the following Medicare IM given date fields will be blank) Date Medicare IM given:  03/18/2014 Medicare IM given by:  Magdalen Spatz Date Additional Medicare IM given:  03/21/2014 Additional Medicare IM given by:  Magdalen Spatz  Discharge Disposition:    Per UR Regulation:  Reviewed for med. necessity/level of care/duration of stay  If discussed at [place] of Stay Meetings, dates discussed:    Comments:

## 2014-03-18 NOTE — Progress Notes (Signed)
Daily Rounding Note  03/18/2014, 5:18 PM  LOS: 2 days   SUBJECTIVE:       NGT clamped since about 2 PM, no adverse effects from this. NGT output was > l liters in 24 hours.   Belly feels fine, no nausea even with drinking water.  Soft BM this AM before the xray.  +flatus.  +productive cough.  OBJECTIVE:         Vital signs in last 24 hours:    Temp:  [98.2 F (36.8 C)-98.4 F (36.9 C)] 98.3 F (36.8 C) (01/08 1333) Pulse Rate:  [82-95] 82 (01/08 1333) Resp:  [18-19] 18 (01/08 1333) BP: (137-142)/(65-79) 142/65 mmHg (01/08 1333) SpO2:  [91 %-93 %] 93 % (01/08 1333) Last BM Date: 03/17/14 There were no vitals filed for this visit. General: looks ill.     Heart: RRR Chest: lots of upper airway ronchi, sounds of tenacious sputum at back of throat Abdomen: soft, active BS.  NT.  ND  Extremities: no CCE  Neuro/Psych:  Oriented x 3.  Relaxed. No gross deficits.   Intake/Output from previous day: 01/07 0701 - 01/08 0700 In: 2961.7 [P.O.:120; I.V.:2791.7; IV Piggyback:50] Out: 8937 [Urine:1210; Emesis/NG output:2225]  Intake/Output this shift: Total I/O In: 33 [P.O.:80] Out: 1100 [Urine:300; Emesis/NG output:800]  Lab Results:  Recent Labs  03/16/14 1824 03/17/14 0653  WBC 18.9* 12.2*  HGB 14.7 11.7*  HCT 43.4 35.8*  PLT 320 242   BMET  Recent Labs  03/16/14 1824 03/17/14 0653 03/18/14 0554  NA 144 143 149*  K 4.2 3.4* 3.2*  CL 105 111 114*  CO2 25 28 31   GLUCOSE 161* 148* 139*  BUN 24* 26* 18  CREATININE 2.49* 1.75* 1.38*  CALCIUM 9.5 7.8* 8.1*   LFT  Recent Labs  03/16/14 1824 03/17/14 0653  PROT 6.7 5.0*  ALBUMIN 3.1* 2.2*  AST 50* 27  ALT 26 16  ALKPHOS 59 43  BILITOT 1.6* 0.7   PT/INR No results for input(s): LABPROT, INR in the last 72 hours. Hepatitis Panel No results for input(s): HEPBSAG, HCVAB, HEPAIGM, HEPBIGM in the last 72 hours.  Studies/Results: Ct Abdomen Pelvis Wo  Contrast  03/16/2014   CLINICAL DATA:  Nausea and vomiting since 2 a.m. History of Crohn's disease. Surgery for Crohn's. Cholecystectomy. Study ordered without oral or IV contrast material. creatinine 2.49.  EXAM: CT ABDOMEN AND PELVIS WITHOUT CONTRAST  TECHNIQUE: Multidetector CT imaging of the abdomen and pelvis was performed following the standard protocol without IV contrast.  COMPARISON:  12/09/2012  FINDINGS: Patchy airspace disease demonstrated in the lower lungs and inferior portion of the right middle lobe and left lingula. This may indicate edema or pneumonia. Similar findings were present previously. No pleural effusions. Small esophageal hiatal hernia.  Surgical absence of the gallbladder. No bile duct dilatation. The unenhanced appearance of the liver, spleen, pancreas, adrenal glands, inferior vena cava, and retroperitoneal lymph nodes is unremarkable. Calcification of the abdominal aorta without aneurysm. Nonobstructing stone in the lower pole right kidney measuring 4.6 mm diameter. No hydronephrosis in either kidney. No ureteral stones identified. Postoperative changes with right colon resection and ileal ascending colonic anastomosis. There is also to anastomosis in the distal small bowel. Moderately dilated fluid in gas-filled small bowel loops are present with transition point in the right lower quadrant suggesting obstruction. The transition zone appears to be a segmental area of narrowing likely representing chronic stricture. Pattern is similar to the previous  study. Multiple calcified objects are present throughout the small bowel consistent with ingested material, possibly bones. Interestingly, similar changes were present previously. The structures may be persistent since the previous study, indicating poor transit or the patient may be ingesting similar substances today as at the prior time. There is no acute wall thickening or edema in the small bowel. Colon is stool filled without  significant distention. No significant wall thickening in the colon to suggest acute inflammatory process. There scattered lymph nodes in the mesentery, likely reactive. No free air or free fluid in the abdomen.  Pelvis: Bladder wall is not thickened. Prostate gland is not enlarged. No free or loculated pelvic fluid collections. No pelvic mass or lymphadenopathy. No inflammatory changes suggested in the rectosigmoid colon. Degenerative changes in the lumbar spine. No destructive bone lesions present. Focal benign-appearing sclerosis in the right iliac bone.  IMPRESSION: Dilated fluid and gas-filled small bowel with transition zone in the right lower quadrant suggesting small bowel obstruction or possibly dysmotility. Retained calcific material within the small bowel, some of which may be with in a blind pouch. Either the same structures or summer structures were present on the prior study. Correlation with the patient's diet is recommended. Persistent finding of patchy airspace disease in the lower lungs. Small nonobstructing stone in the right kidney.  These results were called by telephone at the time of interpretation on 03/16/2014 at 9:21 pm to Dr. Debby Freiberg , who verbally acknowledged these results.   Electronically Signed   By: Lucienne Capers M.D.   On: 03/16/2014 21:31   Dg Abd 1 View  03/17/2014   CLINICAL DATA:  NG tube placement  EXAM: ABDOMEN - 1 VIEW  COMPARISON:  12/12/2012  FINDINGS: Enteric tube appears to be coiled in the lower esophagus. Mild gaseous dilatation of central bowel loops. Postoperative changes in the right abdomen. Degenerative changes in the spine.  IMPRESSION: Enteric tube appears to be coiled on the lower esophagus.  These results were called to the floor by the radiology technologist at the time of interpretation on 03/17/2014 at 1:07 am .   Electronically Signed   By: Lucienne Capers M.D.   On: 03/17/2014 01:13   Dg Abd Portable 1v  03/18/2014   CLINICAL DATA:  Small bowel  obstruction and abdominal pain.  EXAM: PORTABLE ABDOMEN - 1 VIEW  COMPARISON:  03/17/2014  FINDINGS: Nasogastric tube extends into the upper abdomen but the tube is folded on itself and the tip is near the gastric cardia region. There continues to be dilated loops of small bowel in the lower abdomen. Overall, the degree of small bowel distention has slightly decreased. There appears to be increased gas in the colon. Limited evaluation for free air on these supine images. Again noted are calcified structures in the right lower abdomen which are known to be within bowel loops.  IMPRESSION: Persistent dilated loops of small bowel in the abdomen. The degree of small bowel distention has minimally decreased. Slightly increased gas in the colon. Findings could represent a partial small bowel obstruction.  Nasogastric tube as described.   Electronically Signed   By: Markus Daft M.D.   On: 03/18/2014 10:38    ASSESMENT:   * Small bowel obstruction.  Per abdominal film this morning this is persistent with minimally decreased small bowel distention.  *  Long standing Crohn's ileocolitis. Maintained on 6-MP. No active Crohn's on colonoscopy of December 2014.  *  Acute kidney injury  *  Pneumonia, suspected  due to aspiration.   PLAN   *  Continue clamping of NGT per surgery.      Azucena Freed  03/18/2014, 5:18 PM Pager: [phone]     Attending physician's note   I have taken an interval history, reviewed the chart and examined the patient. I agree with the Advanced Practitioner's note, impression and recommendations. NGT has been clamped. Passing flatus. Improving partial SBO. Reassess patient and abd films in the morning. Hopefully we can pull the NGT tomorrow if he continues to improve. Will decrease Solumedrol dose and plan to dc Solumedrol soon as I do not think he has active Crohn's.  Pricilla Riffle. Fuller Plan, MD [facility]

## 2014-03-18 NOTE — Progress Notes (Signed)
PATIENT DETAILS Name: Ricardo Villa Age: 80 y.o. Sex: male Date of Birth: 04-01-1934 Admit Date: 03/16/2014 Admitting Physician Rise Patience, MD ASN:[id number] Nicole Kindred, MD  Brief narrative  Patient is a 80 year old male with Crohn's disease on 6 MP, admitted for a bowel obstruction. Also with acute renal failure from prerenal azotemia due to vomiting.   Subjective: No complaints. Had a small BM today.   Assessment/Plan: Principal Problem:   SBO (small bowel obstruction): Likely secondary to stricture from underlying Crohn's disease.  - Started on IV Solu-Medrol but GI not fully convinced that there is inflammation vs a stricture instead -continue NG tube.  - GI and General surgery following  Active Problems:   Acute renal failure: Suspect prerenal azotemia due to vomiting. Creatinine decreasing with IV fluids.   Hypernatremia/ hypokalemia - transition D5NS to D51/2 NS with KCL    Suspected aspiration pneumonia: Started on Unasyn on on admission. Looks nontoxic, is afebrile. Will empirically continue with antibiotics for at least 5-7 days.     History of Crohn's disease:  - GI following  Disposition: Remain inpatient  Antibiotics:  See below   Anti-infectives    Start     Dose/Rate Route Frequency Ordered Stop   03/18/14 1200  ampicillin-sulbactam (UNASYN) 1.5 g in sodium chloride 0.9 % 50 mL IVPB     1.5 g100 mL/hr over 30 Minutes Intravenous Every 6 hours 03/18/14 1055     03/17/14 0200  ampicillin-sulbactam (UNASYN) 1.5 g in sodium chloride 0.9 % 50 mL IVPB  Status:  Discontinued     1.5 g100 mL/hr over 30 Minutes Intravenous Every 12 hours 03/17/14 0145 03/18/14 1055   03/16/14 2145  cefTRIAXone (ROCEPHIN) 1 g in dextrose 5 % 50 mL IVPB     1 g100 mL/hr over 30 Minutes Intravenous  Once 03/16/14 2139 03/16/14 2255      DVT Prophylaxis: Prophylactic Lovenox  Code Status: Full code   Family Communication Spouse at  bedside  Procedures:  None  CONSULTS:  GI and general surgery  Time spent 30 minutes-  MEDICATIONS: Scheduled Meds: . ampicillin-sulbactam (UNASYN) IV  1.5 g Intravenous Q6H  . enoxaparin (LOVENOX) injection  40 mg Subcutaneous Q24H  . methylPREDNISolone (SOLU-MEDROL) injection  60 mg Intravenous Daily   Continuous Infusions: . dextrose 5 % and 0.45 % NaCl with KCl 40 mEq/L 125 mL/hr at 03/18/14 1513   PRN Meds:.acetaminophen **OR** acetaminophen, morphine injection, ondansetron **OR** ondansetron (ZOFRAN) IV    PHYSICAL EXAM: Vital signs in last 24 hours: Filed Vitals:   03/17/14 1325 03/17/14 2128 03/18/14 0509 03/18/14 1333  BP: 127/62 137/71 137/79 142/65  Pulse: 98 95 94 82  Temp: 97.8 F (36.6 C) 98.4 F (36.9 C) 98.2 F (36.8 C) 98.3 F (36.8 C)  TempSrc: Oral Oral Oral Oral  Resp: 20 19 19 18   SpO2: 95% 91% 91% 93%    Weight change:  There were no vitals filed for this visit. There is no weight on file to calculate BMI.   Gen Exam: Awake and alert with clear speech.   Neck: Supple, No JVD.   Chest: B/L Clear.   CVS: S1 S2 Regular, no murmurs.  Abdomen: soft, BS +, non tender, non distended.  Extremities: no edema, lower extremities warm to touch. Neurologic: Non Focal.   Skin: No Rash.   Wounds: N/A.   Intake/Output from previous day:  Intake/Output Summary (Last 24 hours) at 03/18/14 1542 Last data filed  at 03/18/14 1507  Gross per 24 hour  Intake   1860 ml  Output   3450 ml  Net  -1590 ml     LAB RESULTS: CBC  Recent Labs Lab 03/16/14 1824 03/17/14 0653  WBC 18.9* 12.2*  HGB 14.7 11.7*  HCT 43.4 35.8*  PLT 320 242  MCV 92.9 93.7  MCH 31.5 30.6  MCHC 33.9 32.7  RDW 14.7 14.8  LYMPHSABS 1.6 1.6  MONOABS 1.3* 0.8  EOSABS 0.0 0.0  BASOSABS 0.0 0.0    Chemistries   Recent Labs Lab 03/16/14 1824 03/17/14 0653 03/18/14 0554  NA 144 143 149*  K 4.2 3.4* 3.2*  CL 105 111 114*  CO2 25 28 31   GLUCOSE 161* 148* 139*  BUN  24* 26* 18  CREATININE 2.49* 1.75* 1.38*  CALCIUM 9.5 7.8* 8.1*    CBG:  Recent Labs Lab 03/17/14 1137 03/17/14 1724 03/17/14 2347 03/18/14 0508 03/18/14 1150  GLUCAP 121* 149* 166* 135* 141*    GFR CrCl cannot be calculated (Unknown ideal weight.).  Coagulation profile No results for input(s): INR, PROTIME in the last 168 hours.  Cardiac Enzymes  Recent Labs Lab 03/17/14 0653  TROPONINI 0.04*    Invalid input(s): POCBNP No results for input(s): DDIMER in the last 72 hours. No results for input(s): HGBA1C in the last 72 hours. No results for input(s): CHOL, HDL, LDLCALC, TRIG, CHOLHDL, LDLDIRECT in the last 72 hours. No results for input(s): TSH, T4TOTAL, T3FREE, THYROIDAB in the last 72 hours.  Invalid input(s): FREET3 No results for input(s): VITAMINB12, FOLATE, FERRITIN, TIBC, IRON, RETICCTPCT in the last 72 hours.  Recent Labs  03/16/14 1824  LIPASE 21    Urine Studies No results for input(s): UHGB, CRYS in the last 72 hours.  Invalid input(s): UACOL, UAPR, USPG, UPH, UTP, UGL, UKET, UBIL, UNIT, UROB, ULEU, UEPI, UWBC, URBC, UBAC, CAST, UCOM, BILUA  MICROBIOLOGY: Recent Results (from the past 240 hour(s))  Culture, Urine     Status: None (Preliminary result)   Collection Time: 03/16/14  8:15 PM  Result Value Ref Range Status   Specimen Description URINE, CLEAN CATCH  Final   Special Requests NONE  Final   Colony Count   Final    40,000 COLONIES/ML Performed at [facility]    Culture   Final    ENTEROCOCCUS SPECIES DIPHTHEROIDS(CORYNEBACTERIUM SPECIES) Note: Standardized susceptibility testing for this organism is not available. Performed at [facility]    Report Status PENDING  Incomplete    RADIOLOGY STUDIES/RESULTS: Reviewed  Debbe Odea, MD  [facility]  If 7PM-7AM, please contact night-coverage [email or web address] Password [email or web address] 03/18/2014, 3:42 PM   LOS: 2 days

## 2014-03-19 ENCOUNTER — Inpatient Hospital Stay (HOSPITAL_COMMUNITY): Payer: Medicare Other

## 2014-03-19 LAB — BASIC METABOLIC PANEL
Anion gap: 4 — ABNORMAL LOW (ref 5–15)
BUN: 16 mg/dL (ref 6–23)
CHLORIDE: 116 meq/L — AB (ref 96–112)
CO2: 28 mmol/L (ref 19–32)
CREATININE: 1.24 mg/dL (ref 0.50–1.35)
Calcium: 8.1 mg/dL — ABNORMAL LOW (ref 8.4–10.5)
GFR calc Af Amer: 63 mL/min — ABNORMAL LOW (ref >90)
GFR, EST NON AFRICAN AMERICAN: 54 mL/min — AB (ref >90)
GLUCOSE: 119 mg/dL — AB (ref 70–99)
Potassium: 3.5 mmol/L (ref 3.5–5.1)
SODIUM: 148 mmol/L — AB (ref 135–145)

## 2014-03-19 LAB — URINE CULTURE

## 2014-03-19 LAB — GLUCOSE, CAPILLARY
GLUCOSE-CAPILLARY: 135 mg/dL — AB (ref 70–99)
Glucose-Capillary: 119 mg/dL — ABNORMAL HIGH (ref 70–99)
Glucose-Capillary: 134 mg/dL — ABNORMAL HIGH (ref 70–99)
Glucose-Capillary: 135 mg/dL — ABNORMAL HIGH (ref 70–99)
Glucose-Capillary: 93 mg/dL (ref 70–99)

## 2014-03-19 MED ORDER — DEXTROSE 5 % IV SOLN
INTRAVENOUS | Status: DC
  Administered 2014-03-19 (×2): via INTRAVENOUS

## 2014-03-19 MED ORDER — HYDROCODONE-ACETAMINOPHEN 5-325 MG PO TABS: 1.0000 | ORAL_TABLET | ORAL | Status: DC | PRN

## 2014-03-19 NOTE — Progress Notes (Signed)
ANTIBIOTIC CONSULT NOTE - INITIAL  Pharmacy Consult for Unasyn  Indication: Aspiration PNA  No Known Allergies  Vital Signs: Temp: 97.5 F (36.4 C) (01/09 0547) Temp Source: Oral (01/09 0547) BP: 131/63 mmHg (01/09 0547) Pulse Rate: 67 (01/09 0547)  Labs:  Recent Labs  03/16/14 1824 03/17/14 0653 03/18/14 0554 03/19/14 0409  WBC 18.9* 12.2*  --   --   HGB 14.7 11.7*  --   --   PLT 320 242  --   --   CREATININE 2.49* 1.75* 1.38* 1.24    Assessment: Starting Unasyn for aspiration PNA. WBC elelvated, noted renal function improving, other labs as above.  He is afebrile and improving clinically  Plan:  -Continue Unasyn to 1.5g IV q6h -Pharmacy will sign off.  Please reconsult if further assistance is needed.  Candie Mile 03/19/2014,11:13 AM

## 2014-03-19 NOTE — Progress Notes (Signed)
[facility] Progress Note     Subjective: Pt doing great, no N/V, abdominal pain and distension nearly resolved.  Ambulating well.  Has had 2 BMs since admission.  NG tube only put out 897m/24hr.    Objective: Vital signs in last 24 hours: Temp:  [97.5 F (36.4 C)-98.3 F (36.8 C)] 97.5 F (36.4 C) (01/09 0547) Pulse Rate:  [63-82] 67 (01/09 0547) Resp:  [18-19] 18 (01/09 0547) BP: (124-142)/(60-65) 131/63 mmHg (01/09 0547) SpO2:  [93 %-96 %] 96 % (01/09 0547) Last BM Date: 03/18/14  Intake/Output from previous day: 01/08 0701 - 01/09 0700 In: 2844.6 [P.O.:180; I.V.:2464.6; IV Piggyback:200] Out: 1275 [Urine:475; Emesis/NG output:800] Intake/Output this shift:    PE: Gen:  Alert, NAD, pleasant Abd: Soft, minimally distended, NT, +BS, no HSM, midline scar well healed   Lab Results:   Recent Labs  03/16/14 1824 03/17/14 0653  WBC 18.9* 12.2*  HGB 14.7 11.7*  HCT 43.4 35.8*  PLT 320 242   BMET  Recent Labs  03/18/14 0554 03/19/14 0409  NA 149* 148*  K 3.2* 3.5  CL 114* 116*  CO2 31 28  GLUCOSE 139* 119*  BUN 18 16  CREATININE 1.38* 1.24  CALCIUM 8.1* 8.1*   PT/INR No results for input(s): LABPROT, INR in the last 72 hours. CMP     Component Value Date/Time   NA 148* 03/19/2014 0409   K 3.5 03/19/2014 0409   CL 116* 03/19/2014 0409   CO2 28 03/19/2014 0409   GLUCOSE 119* 03/19/2014 0409   BUN 16 03/19/2014 0409   CREATININE 1.24 03/19/2014 0409   CALCIUM 8.1* 03/19/2014 0409   PROT 5.0* 03/17/2014 0653   ALBUMIN 2.2* 03/17/2014 0653   AST 27 03/17/2014 0653   ALT 16 03/17/2014 0653   ALKPHOS 43 03/17/2014 0653   BILITOT 0.7 03/17/2014 0653   GFRNONAA 54* 03/19/2014 0409   GFRAA 63* 03/19/2014 0409   Lipase     Component Value Date/Time   LIPASE 21 03/16/2014 1824       Studies/Results: Dg Abd Portable 1v  03/18/2014   CLINICAL DATA:  Small bowel obstruction and abdominal pain.  EXAM: PORTABLE ABDOMEN - 1 VIEW   COMPARISON:  03/17/2014  FINDINGS: Nasogastric tube extends into the upper abdomen but the tube is folded on itself and the tip is near the gastric cardia region. There continues to be dilated loops of small bowel in the lower abdomen. Overall, the degree of small bowel distention has slightly decreased. There appears to be increased gas in the colon. Limited evaluation for free air on these supine images. Again noted are calcified structures in the right lower abdomen which are known to be within bowel loops.  IMPRESSION: Persistent dilated loops of small bowel in the abdomen. The degree of small bowel distention has minimally decreased. Slightly increased gas in the colon. Findings could represent a partial small bowel obstruction.  Nasogastric tube as described.   Electronically Signed   By: AMarkus DaftM.D.   On: 03/18/2014 10:38    Anti-infectives: Anti-infectives    Start     Dose/Rate Route Frequency Ordered Stop   03/18/14 1200  ampicillin-sulbactam (UNASYN) 1.5 g in sodium chloride 0.9 % 50 mL IVPB     1.5 g100 mL/hr over 30 Minutes Intravenous Every 6 hours 03/18/14 1055     03/17/14 0200  ampicillin-sulbactam (UNASYN) 1.5 g in sodium chloride 0.9 % 50 mL IVPB  Status:  Discontinued     1.5  g100 mL/hr over 30 Minutes Intravenous Every 12 hours 03/17/14 0145 03/18/14 1055   03/16/14 2145  cefTRIAXone (ROCEPHIN) 1 g in dextrose 5 % 50 mL IVPB     1 g100 mL/hr over 30 Minutes Intravenous  Once 03/16/14 2139 03/16/14 2255       Assessment/Plan SBO History of Crohn's disease Leukocytosis  AKI   Plan: 1. NGT in place, put out 800 in last 24 hours, it has been clamped. D/c NG tube start clears 2. No rush for surgery at this time, seems to be improving with conservative mangement 3. IV hydration and decompression appropriate, agree with steroids 4. Ambulate and IS 5. SCD's and heparin/lovenox per primary service 6. D/c soon    LOS: 3 days    DORT, Tameisha Covell 03/19/2014, 8:12  AM Pager: [phone]

## 2014-03-19 NOTE — Progress Notes (Signed)
PATIENT DETAILS Name: Ricardo Villa Age: 80 y.o. Sex: male Date of Birth: Jul 11, 1934 Admit Date: 03/16/2014 Admitting Physician Rise Patience, MD [id number] Nicole Kindred, MD  Brief narrative  Patient is a 80 year old male with Crohn's disease on 6 MP, admitted for a bowel obstruction. Also with acute renal failure from prerenal azotemia due to vomiting.   Subjective: NG clamped- drinking water   Assessment/Plan: Principal Problem:   SBO (small bowel obstruction): Likely secondary to stricture from underlying Crohn's disease.  - Started on IV Solu-Medrol but GI not fully convinced that there is inflammation vs a stricture instead- plans to d/c steroids by tomorrow -d/c NG per surgery  Active Problems:   Acute renal failure: Suspect prerenal azotemia due to vomiting. Creatinine decreasing with IV fluids. Will cut back on fluids today.  Hypernatremia/ hypokalemia - transition D5W from D51/2 NS     Suspected aspiration pneumonia: Started on Unasyn on on admission. Looks nontoxic, is afebrile. Will empirically continue with antibiotics for at least 5-7 days.     History of Crohn's disease:  - GI following  Disposition: Remain inpatient  Antibiotics:  See below   Anti-infectives    Start     Dose/Rate Route Frequency Ordered Stop   03/18/14 1200  ampicillin-sulbactam (UNASYN) 1.5 g in sodium chloride 0.9 % 50 mL IVPB     1.5 g100 mL/hr over 30 Minutes Intravenous Every 6 hours 03/18/14 1055     03/17/14 0200  ampicillin-sulbactam (UNASYN) 1.5 g in sodium chloride 0.9 % 50 mL IVPB  Status:  Discontinued     1.5 g100 mL/hr over 30 Minutes Intravenous Every 12 hours 03/17/14 0145 03/18/14 1055   03/16/14 2145  cefTRIAXone (ROCEPHIN) 1 g in dextrose 5 % 50 mL IVPB     1 g100 mL/hr over 30 Minutes Intravenous  Once 03/16/14 2139 03/16/14 2255      DVT Prophylaxis: Prophylactic Lovenox  Code Status: Full code   Family Communication Spouse at  bedside  Procedures:  None  CONSULTS:  GI and general surgery  Time spent 30 minutes-  MEDICATIONS: Scheduled Meds: . ampicillin-sulbactam (UNASYN) IV  1.5 g Intravenous Q6H  . enoxaparin (LOVENOX) injection  40 mg Subcutaneous Q24H  . methylPREDNISolone (SOLU-MEDROL) injection  20 mg Intravenous Daily   Continuous Infusions: . dextrose 75 mL/hr at 03/19/14 0912   PRN Meds:.acetaminophen **OR** acetaminophen, HYDROcodone-acetaminophen, morphine injection, ondansetron **OR** ondansetron (ZOFRAN) IV    PHYSICAL EXAM: Vital signs in last 24 hours: Filed Vitals:   03/18/14 0509 03/18/14 1333 03/18/14 2130 03/19/14 0547  BP: 137/79 142/65 124/60 131/63  Pulse: 94 82 63 67  Temp: 98.2 F (36.8 C) 98.3 F (36.8 C) 98.3 F (36.8 C) 97.5 F (36.4 C)  TempSrc: Oral Oral Oral Oral  Resp: 19 18 19 18   SpO2: 91% 93% 93% 96%    Weight change:  There were no vitals filed for this visit. There is no weight on file to calculate BMI.   Gen Exam: Awake and alert with clear speech.   Neck: Supple, No JVD.   Chest: B/L Clear.   CVS: S1 S2 Regular, no murmurs.  Abdomen: soft, BS +, non tender, non distended.  Extremities: no edema, lower extremities warm to touch. Neurologic: Non Focal.   Skin: No Rash.   Wounds: N/A.   Intake/Output from previous day:  Intake/Output Summary (Last 24 hours) at 03/19/14 1224 Last data filed at 03/19/14 0600  Gross per 24 hour  Intake 2844.58 ml  Output    975 ml  Net 1869.58 ml     LAB RESULTS: CBC  Recent Labs Lab 03/16/14 1824 03/17/14 0653  WBC 18.9* 12.2*  HGB 14.7 11.7*  HCT 43.4 35.8*  PLT 320 242  MCV 92.9 93.7  MCH 31.5 30.6  MCHC 33.9 32.7  RDW 14.7 14.8  LYMPHSABS 1.6 1.6  MONOABS 1.3* 0.8  EOSABS 0.0 0.0  BASOSABS 0.0 0.0    Chemistries   Recent Labs Lab 03/16/14 1824 03/17/14 0653 03/18/14 0554 03/19/14 0409  NA 144 143 149* 148*  K 4.2 3.4* 3.2* 3.5  CL 105 111 114* 116*  CO2 25 28 31 28     GLUCOSE 161* 148* 139* 119*  BUN 24* 26* 18 16  CREATININE 2.49* 1.75* 1.38* 1.24  CALCIUM 9.5 7.8* 8.1* 8.1*    CBG:  Recent Labs Lab 03/18/14 1150 03/18/14 1832 03/19/14 0002 03/19/14 0548 03/19/14 1148  GLUCAP 141* 134* 134* 119* 135*    GFR CrCl cannot be calculated (Unknown ideal weight.).  Coagulation profile No results for input(s): INR, PROTIME in the last 168 hours.  Cardiac Enzymes  Recent Labs Lab 03/17/14 0653  TROPONINI 0.04*    Invalid input(s): POCBNP No results for input(s): DDIMER in the last 72 hours. No results for input(s): HGBA1C in the last 72 hours. No results for input(s): CHOL, HDL, LDLCALC, TRIG, CHOLHDL, LDLDIRECT in the last 72 hours. No results for input(s): TSH, T4TOTAL, T3FREE, THYROIDAB in the last 72 hours.  Invalid input(s): FREET3 No results for input(s): VITAMINB12, FOLATE, FERRITIN, TIBC, IRON, RETICCTPCT in the last 72 hours.  Recent Labs  03/16/14 1824  LIPASE 21    Urine Studies No results for input(s): UHGB, CRYS in the last 72 hours.  Invalid input(s): UACOL, UAPR, USPG, UPH, UTP, UGL, UKET, UBIL, UNIT, UROB, ULEU, UEPI, UWBC, URBC, UBAC, CAST, UCOM, BILUA  MICROBIOLOGY: Recent Results (from the past 240 hour(s))  Culture, Urine     Status: None (Preliminary result)   Collection Time: 03/16/14  8:15 PM  Result Value Ref Range Status   Specimen Description URINE, CLEAN CATCH  Final   Special Requests NONE  Final   Colony Count   Final    40,000 COLONIES/ML Performed at [facility]    Culture   Final    ENTEROCOCCUS SPECIES DIPHTHEROIDS(CORYNEBACTERIUM SPECIES) Note: Standardized susceptibility testing for this organism is not available. Performed at [facility]    Report Status PENDING  Incomplete    RADIOLOGY STUDIES/RESULTS: Reviewed  Debbe Odea, MD  [facility]  If 7PM-7AM, please contact night-coverage [email or web address] Password [email or web address] 03/19/2014, 12:24 PM   LOS: 3 days

## 2014-03-19 NOTE — Progress Notes (Addendum)
Progress Note   Subjective  Feels much better. Passing flatus and BMs. NGT was removed. Ambulating in halls.    Objective  Vital signs in last 24 hours: Temp:  [97.5 F (36.4 C)-98.3 F (36.8 C)] 97.5 F (36.4 C) (01/09 0547) Pulse Rate:  [63-82] 67 (01/09 0547) Resp:  [18-19] 18 (01/09 0547) BP: (124-142)/(60-65) 131/63 mmHg (01/09 0547) SpO2:  [93 %-96 %] 96 % (01/09 0547) Last BM Date: 03/18/14   General:   Alert,  Well-developed,   in NAD Heart:  Regular rate and rhythm; no murmurs Abdomen:  Soft, nontender and minimally distended. + bowel sounds, without guarding, and without rebound.   Extremities:  Without edema. Neurologic:  Alert and  oriented x4;  grossly normal neurologically. Psych:  Alert and cooperative. Normal mood and affect.  Intake/Output from previous day: 01/08 0701 - 01/09 0700 In: 2844.6 [P.O.:180; I.V.:2464.6; IV Piggyback:200] Out: 1275 [Urine:475; Emesis/NG output:800] Intake/Output this shift:    Lab Results:  Recent Labs  03/16/14 1824 03/17/14 0653  WBC 18.9* 12.2*  HGB 14.7 11.7*  HCT 43.4 35.8*  PLT 320 242   BMET  Recent Labs  03/17/14 0653 03/18/14 0554 03/19/14 0409  NA 143 149* 148*  K 3.4* 3.2* 3.5  CL 111 114* 116*  CO2 28 31 28   GLUCOSE 148* 139* 119*  BUN 26* 18 16  CREATININE 1.75* 1.38* 1.24  CALCIUM 7.8* 8.1* 8.1*   LFT  Recent Labs  03/17/14 0653  PROT 5.0*  ALBUMIN 2.2*  AST 27  ALT 16  ALKPHOS 43  BILITOT 0.7    Studies/Results: Dg Abd Portable 1v  03/19/2014   CLINICAL DATA:  Bowel obstruction  EXAM: PORTABLE ABDOMEN - 1 VIEW  COMPARISON:  March 18, 2014  FINDINGS: Nasogastric tube tip and side port are in the distal stomach. There remain loops of dilated bowel in the mid abdomen without appreciable air-fluid level on this supine examination. No free air is seen on this supine examination. There are phleboliths in the pelvis. There are surgical clips in the right abdomen.  IMPRESSION: There  remains bowel dilatation consistent with either ileus or a degree of persistent obstruction. No free air is seen on this supine examination. Nasogastric tube tip and side port in distal stomach.   Electronically Signed   By: Lowella Grip M.D.   On: 03/19/2014 08:48   Dg Abd Portable 1v  03/18/2014   CLINICAL DATA:  Small bowel obstruction and abdominal pain.  EXAM: PORTABLE ABDOMEN - 1 VIEW  COMPARISON:  03/17/2014  FINDINGS: Nasogastric tube extends into the upper abdomen but the tube is folded on itself and the tip is near the gastric cardia region. There continues to be dilated loops of small bowel in the lower abdomen. Overall, the degree of small bowel distention has slightly decreased. There appears to be increased gas in the colon. Limited evaluation for free air on these supine images. Again noted are calcified structures in the right lower abdomen which are known to be within bowel loops.  IMPRESSION: Persistent dilated loops of small bowel in the abdomen. The degree of small bowel distention has minimally decreased. Slightly increased gas in the colon. Findings could represent a partial small bowel obstruction.  Nasogastric tube as described.   Electronically Signed   By: Markus Daft M.D.   On: 03/18/2014 10:38      Assessment & Plan   Improving partial SBO likely due to a stricture or adhesions. NGT has been  removed. Passing flatus and bowel movements. Abd films show persistent dilated SB loops. Reassess patient and check abd films tomorrow. Solumedrol dose was decreased and plan to dc Solumedrol on Sunday as I do not think he has active Crohn's.  Principal Problem:   SBO (small bowel obstruction) Active Problems:   Acute renal failure   Leucocytosis    LOS: 3 days   Ricardo Villa Plan MD 03/19/2014, 10:20 AM

## 2014-03-20 ENCOUNTER — Inpatient Hospital Stay (HOSPITAL_COMMUNITY): Payer: Medicare Other

## 2014-03-20 DIAGNOSIS — E876 Hypokalemia: Secondary | ICD-10-CM | POA: Insufficient documentation

## 2014-03-20 DIAGNOSIS — E870 Hyperosmolality and hypernatremia: Secondary | ICD-10-CM | POA: Insufficient documentation

## 2014-03-20 LAB — BASIC METABOLIC PANEL
Anion gap: 3 — ABNORMAL LOW (ref 5–15)
BUN: 12 mg/dL (ref 6–23)
CO2: 29 mmol/L (ref 19–32)
CREATININE: 1.14 mg/dL (ref 0.50–1.35)
Calcium: 8.2 mg/dL — ABNORMAL LOW (ref 8.4–10.5)
Chloride: 110 mEq/L (ref 96–112)
GFR calc non Af Amer: 60 mL/min — ABNORMAL LOW (ref >90)
GFR, EST AFRICAN AMERICAN: 70 mL/min — AB (ref >90)
Glucose, Bld: 108 mg/dL — ABNORMAL HIGH (ref 70–99)
Potassium: 2.9 mmol/L — ABNORMAL LOW (ref 3.5–5.1)
Sodium: 142 mmol/L (ref 135–145)

## 2014-03-20 LAB — GLUCOSE, CAPILLARY: Glucose-Capillary: 96 mg/dL (ref 70–99)

## 2014-03-20 MED ORDER — FENOFIBRATE 54 MG PO TABS
54.0000 mg | ORAL_TABLET | Freq: Every day | ORAL | Status: DC
  Administered 2014-03-20 – 2014-03-21 (×2): 54 mg via ORAL
  Filled 2014-03-20 (×2): qty 1

## 2014-03-20 MED ORDER — ASPIRIN EC 81 MG PO TBEC
81.0000 mg | DELAYED_RELEASE_TABLET | Freq: Every day | ORAL | Status: DC
  Administered 2014-03-20 – 2014-03-21 (×2): 81 mg via ORAL
  Filled 2014-03-20 (×2): qty 1

## 2014-03-20 MED ORDER — POTASSIUM CHLORIDE 20 MEQ/15ML (10%) PO SOLN
40.0000 meq | ORAL | Status: DC
  Administered 2014-03-20 – 2014-03-21 (×6): 40 meq via ORAL
  Filled 2014-03-20 (×14): qty 30

## 2014-03-20 MED ORDER — POTASSIUM CHLORIDE 20 MEQ PO PACK
40.0000 meq | PACK | ORAL | Status: DC
  Filled 2014-03-20 (×2): qty 2

## 2014-03-20 MED ORDER — MERCAPTOPURINE 50 MG PO TABS
50.0000 mg | ORAL_TABLET | Freq: Every day | ORAL | Status: DC
  Administered 2014-03-20 – 2014-03-21 (×2): 50 mg via ORAL
  Filled 2014-03-20 (×2): qty 1

## 2014-03-20 NOTE — Progress Notes (Signed)
Subjective: Tolerating soft diet well.  Bowels moving.  Objective: Vital signs in last 24 hours: Temp:  [97.5 F (36.4 C)-98.7 F (37.1 C)] 98.7 F (37.1 C) (01/10 0539) Pulse Rate:  [58-76] 62 (01/10 0539) Resp:  [17-18] 17 (01/10 0539) BP: (118-135)/(65-75) 135/68 mmHg (01/10 0539) SpO2:  [92 %-95 %] 92 % (01/10 0539) Last BM Date: 03/19/14  Intake/Output from previous day: 01/09 0701 - 01/10 0700 In: 1871.7 [P.O.:440; I.V.:1231.7; IV Piggyback:200] Out: 325 [Urine:325] Intake/Output this shift: Total I/O In: -  Out: 1 [Stool:1]  PE: General- In NAD Abdomen-soft, nontender, nondistended  Lab Results:  No results for input(s): WBC, HGB, HCT, PLT in the last 72 hours. BMET  Recent Labs  03/18/14 0554 03/19/14 0409  NA 149* 148*  K 3.2* 3.5  CL 114* 116*  CO2 31 28  GLUCOSE 139* 119*  BUN 18 16  CREATININE 1.38* 1.24  CALCIUM 8.1* 8.1*   PT/INR No results for input(s): LABPROT, INR in the last 72 hours. Comprehensive Metabolic Panel:    Component Value Date/Time   NA 148* 03/19/2014 0409   NA 149* 03/18/2014 0554   K 3.5 03/19/2014 0409   K 3.2* 03/18/2014 0554   CL 116* 03/19/2014 0409   CL 114* 03/18/2014 0554   CO2 28 03/19/2014 0409   CO2 31 03/18/2014 0554   BUN 16 03/19/2014 0409   BUN 18 03/18/2014 0554   CREATININE 1.24 03/19/2014 0409   CREATININE 1.38* 03/18/2014 0554   GLUCOSE 119* 03/19/2014 0409   GLUCOSE 139* 03/18/2014 0554   CALCIUM 8.1* 03/19/2014 0409   CALCIUM 8.1* 03/18/2014 0554   AST 27 03/17/2014 0653   AST 50* 03/16/2014 1824   ALT 16 03/17/2014 0653   ALT 26 03/16/2014 1824   ALKPHOS 43 03/17/2014 0653   ALKPHOS 59 03/16/2014 1824   BILITOT 0.7 03/17/2014 0653   BILITOT 1.6* 03/16/2014 1824   PROT 5.0* 03/17/2014 0653   PROT 6.7 03/16/2014 1824   ALBUMIN 2.2* 03/17/2014 0653   ALBUMIN 3.1* 03/16/2014 1824     Studies/Results: Dg Abd Portable 1v  03/19/2014   CLINICAL DATA:  Bowel obstruction  EXAM: PORTABLE  ABDOMEN - 1 VIEW  COMPARISON:  March 18, 2014  FINDINGS: Nasogastric tube tip and side port are in the distal stomach. There remain loops of dilated bowel in the mid abdomen without appreciable air-fluid level on this supine examination. No free air is seen on this supine examination. There are phleboliths in the pelvis. There are surgical clips in the right abdomen.  IMPRESSION: There remains bowel dilatation consistent with either ileus or a degree of persistent obstruction. No free air is seen on this supine examination. Nasogastric tube tip and side port in distal stomach.   Electronically Signed   By: Lowella Grip M.D.   On: 03/19/2014 08:48   Dg Abd Portable 1v  03/18/2014   CLINICAL DATA:  Small bowel obstruction and abdominal pain.  EXAM: PORTABLE ABDOMEN - 1 VIEW  COMPARISON:  03/17/2014  FINDINGS: Nasogastric tube extends into the upper abdomen but the tube is folded on itself and the tip is near the gastric cardia region. There continues to be dilated loops of small bowel in the lower abdomen. Overall, the degree of small bowel distention has slightly decreased. There appears to be increased gas in the colon. Limited evaluation for free air on these supine images. Again noted are calcified structures in the right lower abdomen which are known to be within bowel loops.  IMPRESSION: Persistent dilated loops of small bowel in the abdomen. The degree of small bowel distention has minimally decreased. Slightly increased gas in the colon. Findings could represent a partial small bowel obstruction.  Nasogastric tube as described.   Electronically Signed   By: Markus Daft M.D.   On: 03/18/2014 10:38    Anti-infectives: Anti-infectives    Start     Dose/Rate Route Frequency Ordered Stop   03/18/14 1200  ampicillin-sulbactam (UNASYN) 1.5 g in sodium chloride 0.9 % 50 mL IVPB     1.5 g100 mL/hr over 30 Minutes Intravenous Every 6 hours 03/18/14 1055     03/17/14 0200  ampicillin-sulbactam (UNASYN) 1.5  g in sodium chloride 0.9 % 50 mL IVPB  Status:  Discontinued     1.5 g100 mL/hr over 30 Minutes Intravenous Every 12 hours 03/17/14 0145 03/18/14 1055   03/16/14 2145  cefTRIAXone (ROCEPHIN) 1 g in dextrose 5 % 50 mL IVPB     1 g100 mL/hr over 30 Minutes Intravenous  Once 03/16/14 2139 03/16/14 2255      Assessment Principal Problem:   Crohn's disease with SBO (small bowel obstruction)-resolved with nonoperative management.     LOS: 4 days   Plan: No indication for surgery at this time.  Please call if we can be of further assistance.   Ricardo Villa 03/20/2014

## 2014-03-20 NOTE — Progress Notes (Signed)
PROGRESS NOTE  Ricardo Villa UGQ:[id number] DOB: 1934/08/11 DOA: 03/16/2014 PCP: [name], MD  HPI: Patient is a 80 year old male with Crohn's disease on 6 MP, admitted for a bowel obstruction. Also with acute renal failure from prerenal azotemia due to vomiting.   Subjective / 24 H Interval events Feeling "great", just finished breakfast, no nausea/vomiting  Assessment/Plan: Principal Problem:   SBO (small bowel obstruction) Active Problems:   Acute renal failure   Leucocytosis  SBO (small bowel obstruction): Likely secondary to stricture from underlying Crohn's disease.  - Started on IV Solu-Medrol but GI not fully convinced that there is inflammation vs a stricture instead - d/c steroids today  - eating solids today, tolerating well   Acute renal failure: Suspect prerenal azotemia due to vomiting. Creatinine decreasing with IV fluids.  - d/c fluids as he is eating  Hypernatremia/ hypokalemia - hypernatremia resolved, hypokalemia requiring repletion  Suspected aspiration pneumonia: Started on Unasyn on on admission. Looks nontoxic, is afebrile. Will empirically continue with antibiotics for at least 5-7 days.  History of Crohn's disease:  - GI following   Diet: DIET SOFT Fluids: none  DVT Prophylaxis: Lovenox  Code Status: Full Code Family Communication: d/w wife bedside  Disposition Plan: home when ready   Consultants:  GI  General surgery   Procedures:  None    Antibiotics  Anti-infectives    Start     Dose/Rate Route Frequency Ordered Stop   03/18/14 1200  ampicillin-sulbactam (UNASYN) 1.5 g in sodium chloride 0.9 % 50 mL IVPB     1.5 g100 mL/hr over 30 Minutes Intravenous Every 6 hours 03/18/14 1055     03/17/14 0200  ampicillin-sulbactam (UNASYN) 1.5 g in sodium chloride 0.9 % 50 mL IVPB  Status:  Discontinued     1.5 g100 mL/hr over 30 Minutes Intravenous Every 12 hours 03/17/14 0145 03/18/14 1055   03/16/14 2145   cefTRIAXone (ROCEPHIN) 1 g in dextrose 5 % 50 mL IVPB     1 g100 mL/hr over 30 Minutes Intravenous  Once 03/16/14 2139 03/16/14 2255       Studies  Dg Abd Portable 1v  03/19/2014   CLINICAL DATA:  Bowel obstruction  EXAM: PORTABLE ABDOMEN - 1 VIEW  COMPARISON:  March 18, 2014  FINDINGS: Nasogastric tube tip and side port are in the distal stomach. There remain loops of dilated bowel in the mid abdomen without appreciable air-fluid level on this supine examination. No free air is seen on this supine examination. There are phleboliths in the pelvis. There are surgical clips in the right abdomen.  IMPRESSION: There remains bowel dilatation consistent with either ileus or a degree of persistent obstruction. No free air is seen on this supine examination. Nasogastric tube tip and side port in distal stomach.   Electronically Signed   By: Lowella Grip M.D.   On: 03/19/2014 08:48   Dg Abd Portable 1v  03/18/2014   CLINICAL DATA:  Small bowel obstruction and abdominal pain.  EXAM: PORTABLE ABDOMEN - 1 VIEW  COMPARISON:  03/17/2014  FINDINGS: Nasogastric tube extends into the upper abdomen but the tube is folded on itself and the tip is near the gastric cardia region. There continues to be dilated loops of small bowel in the lower abdomen. Overall, the degree of small bowel distention has slightly decreased. There appears to be increased gas in the colon. Limited evaluation for free air on these supine images. Again noted are calcified structures in the right lower abdomen  which are known to be within bowel loops.  IMPRESSION: Persistent dilated loops of small bowel in the abdomen. The degree of small bowel distention has minimally decreased. Slightly increased gas in the colon. Findings could represent a partial small bowel obstruction.  Nasogastric tube as described.   Electronically Signed   By: Markus Daft M.D.   On: 03/18/2014 10:38    Objective  Filed Vitals:   03/19/14 0547 03/19/14 1354 03/19/14  2126 03/20/14 0539  BP: 131/63 133/75 118/65 135/68  Pulse: 67 76 58 62  Temp: 97.5 F (36.4 C) 97.7 F (36.5 C) 97.5 F (36.4 C) 98.7 F (37.1 C)  TempSrc: Oral Oral Oral Oral  Resp: 18 17 18 17   SpO2: 96% 94% 95% 92%    Intake/Output Summary (Last 24 hours) at 03/20/14 1003 Last data filed at 03/20/14 0830  Gross per 24 hour  Intake 1871.66 ml  Output    326 ml  Net 1545.66 ml   There were no vitals filed for this visit.  Exam:  General:  NAD  HEENT: no scleral icterus  Cardiovascular: RRR without murmurs  Respiratory: CTA biL  Abdomen: soft, non tender  MSK/Extremities: no cyanosis  Skin: no rashes  Neuro: non focal   Data Reviewed: Basic Metabolic Panel:  Recent Labs Lab 03/16/14 1824 03/17/14 0653 03/18/14 0554 03/19/14 0409 03/20/14 0850  NA 144 143 149* 148* 142  K 4.2 3.4* 3.2* 3.5 2.9*  CL 105 111 114* 116* 110  CO2 25 28 31 28 29   GLUCOSE 161* 148* 139* 119* 108*  BUN 24* 26* 18 16 12   CREATININE 2.49* 1.75* 1.38* 1.24 1.14  CALCIUM 9.5 7.8* 8.1* 8.1* 8.2*   Liver Function Tests:  Recent Labs Lab 03/16/14 1824 03/17/14 0653  AST 50* 27  ALT 26 16  ALKPHOS 59 43  BILITOT 1.6* 0.7  PROT 6.7 5.0*  ALBUMIN 3.1* 2.2*    Recent Labs Lab 03/16/14 1824  LIPASE 21   CBC:  Recent Labs Lab 03/16/14 1824 03/17/14 0653  WBC 18.9* 12.2*  NEUTROABS 15.9* 9.8*  HGB 14.7 11.7*  HCT 43.4 35.8*  MCV 92.9 93.7  PLT 320 242   Cardiac Enzymes:  Recent Labs Lab 03/17/14 0653  TROPONINI 0.04*   CBG:  Recent Labs Lab 03/19/14 0548 03/19/14 1148 03/19/14 1814 03/19/14 2348 03/20/14 0539  GLUCAP 119* 135* 135* 93 96    Recent Results (from the past 240 hour(s))  Culture, Urine     Status: None   Collection Time: 03/16/14  8:15 PM  Result Value Ref Range Status   Specimen Description URINE, CLEAN CATCH  Final   Special Requests NONE  Final   Colony Count   Final    40,000 COLONIES/ML Performed at [facility]    Culture   Final    ENTEROCOCCUS SPECIES DIPHTHEROIDS(CORYNEBACTERIUM SPECIES) Note: Standardized susceptibility testing for this organism is not available. Performed at [facility]    Report Status 03/19/2014 FINAL  Final   Organism ID, Bacteria ENTEROCOCCUS SPECIES  Final      Susceptibility   Enterococcus species - MIC*    AMPICILLIN <=2 SENSITIVE Sensitive     LEVOFLOXACIN 1 SENSITIVE Sensitive     NITROFURANTOIN <=16 SENSITIVE Sensitive     VANCOMYCIN 2 SENSITIVE Sensitive     TETRACYCLINE >=16 RESISTANT Resistant     * ENTEROCOCCUS SPECIES     Scheduled Meds: . ampicillin-sulbactam (UNASYN) IV  1.5 g Intravenous Q6H  . enoxaparin (  LOVENOX) injection  40 mg Subcutaneous Q24H  . mercaptopurine  50 mg Oral Daily  . potassium chloride  40 mEq Oral Q4H   Continuous Infusions:   Marzetta Board, MD [facility] Pager [phone]. If 7 PM - 7 AM, please contact night-coverage at [email or web address], password [facility] 03/20/2014, 10:03 AM  LOS: 4 days

## 2014-03-20 NOTE — Progress Notes (Signed)
Daily Rounding Note  03/20/2014, 9:28 AM  LOS: 4 days   SUBJECTIVE:       Having bowel movements: one daily x 3 days, feels good. NGT removed. Diet advanced per surgery.  OBJECTIVE:         Vital signs in last 24 hours:    Temp:  [97.5 F (36.4 C)-98.7 F (37.1 C)] 98.7 F (37.1 C) (01/10 0539) Pulse Rate:  [58-76] 62 (01/10 0539) Resp:  [17-18] 17 (01/10 0539) BP: (118-135)/(65-75) 135/68 mmHg (01/10 0539) SpO2:  [92 %-95 %] 92 % (01/10 0539) Last BM Date: 03/19/14 There were no vitals filed for this visit. General: NAD  Heart: RRR Chest: still with some wet vocal quality, but it is better than when NGT was in.  Abdomen: NT, distended, tympanitic, hyperactive BS Extremities: no CCE Neuro/Psych:  Pleasant, relaxed.   In good spirits.   Intake/Output from previous day: 01/09 0701 - 01/10 0700 In: 1871.7 [P.O.:440; I.V.:1231.7; IV Piggyback:200] Out: 325 [Urine:325]  Intake/Output this shift: Total I/O In: -  Out: 1 [Stool:1]  Lab Results: No results for input(s): WBC, HGB, HCT, PLT in the last 72 hours. BMET  Recent Labs  03/18/14 0554 03/19/14 0409  NA 149* 148*  K 3.2* 3.5  CL 114* 116*  CO2 31 28  GLUCOSE 139* 119*  BUN 18 16  CREATININE 1.38* 1.24  CALCIUM 8.1* 8.1*    Studies/Results: Dg Abd Portable 1v  03/19/2014   CLINICAL DATA:  Bowel obstruction  EXAM: PORTABLE ABDOMEN - 1 VIEW  COMPARISON:  March 18, 2014  FINDINGS: Nasogastric tube tip and side port are in the distal stomach. There remain loops of dilated bowel in the mid abdomen without appreciable air-fluid level on this supine examination. No free air is seen on this supine examination. There are phleboliths in the pelvis. There are surgical clips in the right abdomen.  IMPRESSION: There remains bowel dilatation consistent with either ileus or a degree of persistent obstruction. No free air is seen on this supine examination.  Nasogastric tube tip and side port in distal stomach.   Electronically Signed   By: Lowella Grip M.D.   On: 03/19/2014 08:48   Dg Abd Portable 1v  03/18/2014   CLINICAL DATA:  Small bowel obstruction and abdominal pain.  EXAM: PORTABLE ABDOMEN - 1 VIEW  COMPARISON:  03/17/2014  FINDINGS: Nasogastric tube extends into the upper abdomen but the tube is folded on itself and the tip is near the gastric cardia region. There continues to be dilated loops of small bowel in the lower abdomen. Overall, the degree of small bowel distention has slightly decreased. There appears to be increased gas in the colon. Limited evaluation for free air on these supine images. Again noted are calcified structures in the right lower abdomen which are known to be within bowel loops.  IMPRESSION: Persistent dilated loops of small bowel in the abdomen. The degree of small bowel distention has minimally decreased. Slightly increased gas in the colon. Findings could represent a partial small bowel obstruction.  Nasogastric tube as described.   Electronically Signed   By: Markus Daft M.D.   On: 03/18/2014 10:38    ASSESMENT:   *  PSBO.  Though NGT has been removed, BS still tympanitic and tight abdomen.  *  Hx crohns disease   PLAN   *  Stop steroids, KUB already ordered.    Azucena Freed  03/20/2014, 9:28 AM Pager:  [phone]     Attending physician's note   I have taken an interval history, reviewed the chart and examined the patient. I agree with the Advanced Practitioner's note, impression and recommendations. Partial SBO clinically improved but does not appear to be resolved as abdomen remains distended and tympanitic. Abd films today. DC Solumedrol. Resume 6MP 50 mg daily.   Pricilla Riffle. Fuller Plan, MD [facility]

## 2014-03-21 LAB — POTASSIUM: Potassium: 4.2 mmol/L (ref 3.5–5.1)

## 2014-03-21 NOTE — Progress Notes (Signed)
Daily Rounding Note  03/21/2014, 10:30 AM  LOS: 5 days   SUBJECTIVE:       Tolerating soft diet. Soft BM yesterday and today.  Feels great.  Still coughing but much improved.   OBJECTIVE:         Vital signs in last 24 hours:    Temp:  [97.4 F (36.3 C)-98.7 F (37.1 C)] 97.4 F (36.3 C) (01/11 0600) Pulse Rate:  [69-78] 78 (01/11 0600) Resp:  [16-17] 17 (01/11 0600) BP: (140-155)/(63-79) 140/75 mmHg (01/11 0600) SpO2:  [92 %-98 %] 92 % (01/11 0600) Weight:  [173 lb (78.472 kg)] 173 lb (78.472 kg) (01/11 0900) Last BM Date: 03/20/14 Filed Weights   03/21/14 0900  Weight: 173 lb (78.472 kg)   General: looks better, not 100% healthy looking.  comfortable   Heart: RRR Chest: some wet sounding cough.  No rales, no wheezes.  Abdomen: soft, NT. ND.  Active BS Extremities: no CCE Neuro/Psych:  Pleasant, alert/oriented x 3.  No tremor.  Relaxed and in good spirits.   Intake/Output from previous day: 01/10 0701 - 01/11 0700 In: 410 [P.O.:360; IV Piggyback:50] Out: 751 [Urine:750; Stool:1]  Intake/Output this shift:    Lab Results: No results for input(s): WBC, HGB, HCT, PLT in the last 72 hours. BMET  Recent Labs  03/19/14 0409 03/20/14 0850 03/21/14 0640  NA 148* 142  --   K 3.5 2.9* 4.2  CL 116* 110  --   CO2 28 29  --   GLUCOSE 119* 108*  --   BUN 16 12  --   CREATININE 1.24 1.14  --   CALCIUM 8.1* 8.2*  --    LFT No results for input(s): PROT, ALBUMIN, AST, ALT, ALKPHOS, BILITOT, BILIDIR, IBILI in the last 72 hours. PT/INR No results for input(s): LABPROT, INR in the last 72 hours. Hepatitis Panel No results for input(s): HEPBSAG, HCVAB, HEPAIGM, HEPBIGM in the last 72 hours.  Studies/Results: Dg Abd Portable 1v  03/20/2014   CLINICAL DATA:  Small bowel obstruction. Personal history of Crohn's disease, prostate cancer, and diverticulitis.  EXAM: PORTABLE ABDOMEN - 1 VIEW  COMPARISON:  03/19/2014   FINDINGS: The nasogastric tube is been removed. Staple lines noted along the right abdominal bowel. Several calcifications projecting over the upper pelvis was previously shown to be an intraluminal calcified structure within the bowel.  No dilated bowel currently identified.  No free intraperitoneal gas.  IMPRESSION: 1. No dilated bowel currently noted. Calcifications projecting over the pelvis or previously shown to be intraluminal within dilated small bowel just proximal to an anastomotic staple line.   Electronically Signed   By: Sherryl Barters M.D.   On: 03/20/2014 18:38   Scheduled Meds: . ampicillin-sulbactam (UNASYN) IV  1.5 g Intravenous Q6H  . aspirin EC  81 mg Oral Daily  . enoxaparin (LOVENOX) injection  40 mg Subcutaneous Q24H  . fenofibrate  54 mg Oral Daily  . mercaptopurine  50 mg Oral Daily  . potassium chloride  40 mEq Oral Q4H   Continuous Infusions:  PRN Meds:.acetaminophen **OR** acetaminophen, HYDROcodone-acetaminophen, morphine injection, ondansetron **OR** ondansetron (ZOFRAN) IV   ASSESMENT:   * PSBO. thought secondary to adhensions, not active Crohns.  Resolved by lates xray of 1/10.  NGT removed 1/9.    * Hx crohns ileocolitis. Resections in 1988, 1995.  On latest colonoscopy 02/2013: no active disease, + polyps (base of one large, ascending TVA polyp could not be  removed).  Maintained on 6MP. Due surveillance colonoscopy in 02/2016.   *  ? Aspiration PNA?:  Will have completed course of Unasyn at time of discharge. .  *  Hypokalemia, resolved.  Still has orders for q4 hours of IV potassim.   PLAN   *  Home on same dose of 6 MP, no steroids, restart the B12 and hemocyte as per home routine.  *  Will stop potassium.     Ricardo Villa  03/21/2014, 10:30 AM   Tolerating diet.  Agree with discharge plans  Sandy Salaam. Deatra Ina, MD Pager: [phone]

## 2014-03-21 NOTE — Discharge Summary (Signed)
Physician Discharge Summary  Ricardo Villa:[id number] DOB: [birth year] DOA: 03/16/2014  PCP: Ricardo Heck, MD  Admit date: 03/16/2014 Discharge date: 03/21/2014  Time spent: 45 minutes  Recommendations for Outpatient Follow-up:  1. Follow up with Dr. Drema Villa in 1-2 weeks 2. Follow up with GI as an outpatient   Discharge Diagnoses:  Principal Problem:   SBO (small bowel obstruction) Active Problems:   Acute renal failure   Leucocytosis   Hypernatremia   Hypokalemia  Discharge Condition: stable  Diet recommendation: regular   Filed Weights   03/21/14 0900  Weight: 78.472 kg (173 lb)   History of present illness:  Ricardo Villa is a 80 y.o. male with history of Crohn's disease presents to the ER because of nausea and vomiting. Patient's symptoms started yesterday with multiple episodes of nausea and vomiting. Patient last bowel movement was today in the afternoon. Denies any abdominal pain until patient came to the ER where he had mild lower quadrant discomfort. CT abdomen and pelvis shows small bowel obstruction with transition point. On-call surgeon Dr. Hulen Skains has been consulted. Patient otherwise denies any chest pain or shortness of breath has been having nonproductive cough for some time. CT also shows airspace disease. Patient has leukocytosis.   Hospital Course:  Patient was admitted to the hospital with SBO thought to be due to stricture from underlying Chron's disease. Gastroenterology and General Surgery were consulted and followed patient while hospitalized. He was initially started on IV steroids, however GI was not fully convinced that there is inflammation and his steroids were discontinued. Patient's SBO was improved with conservative management, his diet was advanced to solids and he is tolerating it well. He started having regular bowel movements and had no further GI issues. He was empirically treated for possible aspiration pneumonia and has  finished 6 days of antibiotics while hospitalized. He had mild hypokalemia likely due to GI losses, resolved after potassium supplementation. He is to resume his prior home medications and to have outpatient follow up with his PCP and GI.   Procedures:  None    Consultations:  General surgery   Gastroenterology   Discharge Exam: Filed Vitals:   03/20/14 1329 03/20/14 2151 03/21/14 0600 03/21/14 0900  BP: 142/63 155/79 140/75   Pulse: 69 73 78   Temp: 97.9 F (36.6 C) 98.7 F (37.1 C) 97.4 F (36.3 C)   TempSrc: Axillary Oral Oral   Resp: 16 16 17    Height:    6' 1"  (1.854 m)  Weight:    78.472 kg (173 lb)  SpO2: 98% 97% 92%     General: NAD Cardiovascular: RRR Respiratory: CTA biL  Discharge Instructions     Medication List    TAKE these medications        aspirin 81 MG tablet  Take 81 mg by mouth daily.     B-12 (Methylcobalamin) 1000 MCG Subl  Place 1 tablet under the tongue daily.     diphenoxylate-atropine 2.5-0.025 MG per tablet  Commonly known as:  LOMOTIL  TAKE ONE TABLET 1 OR 2 TIMES DAILY AS NEEDED     fenofibrate 48 MG tablet  Commonly known as:  TRICOR  TAKE ONE TABLET EACH DAY     ferrous fumarate 325 (106 FE) MG Tabs tablet  Commonly known as:  HEMOCYTE - 106 mg FE  Take 1 tablet by mouth daily.     mercaptopurine 50 MG tablet  Commonly known as:  PURINETHOL  TAKE 1 TABLET DAILY. GIVE  ON AN EMPTY STOMACH 1 HOUR BEFORE OR 2 HOURS AFTER MEALS. CAUTION: CHEMOTHERAPY.     PROBIOTIC DAILY PO  Take 1 tablet by mouth daily.           Follow-up Information    Follow up with Ricardo Heck, MD. Schedule an appointment as soon as possible for a visit in 2 weeks.   Specialty:  Family Medicine   Contact information:   [place] [address] [phone]       The results of significant diagnostics from this hospitalization (including imaging, microbiology, ancillary and laboratory) are listed below for  reference.    Significant Diagnostic Studies: Ct Abdomen Pelvis Wo Contrast  03/16/2014   CLINICAL DATA:  Nausea and vomiting since 2 a.m. History of Crohn's disease. Surgery for Crohn's. Cholecystectomy. Study ordered without oral or IV contrast material. creatinine 2.49.  EXAM: CT ABDOMEN AND PELVIS WITHOUT CONTRAST  TECHNIQUE: Multidetector CT imaging of the abdomen and pelvis was performed following the standard protocol without IV contrast.  COMPARISON:  12/09/2012  FINDINGS: Patchy airspace disease demonstrated in the lower lungs and inferior portion of the right middle lobe and left lingula. This may indicate edema or pneumonia. Similar findings were present previously. No pleural effusions. Small esophageal hiatal hernia.  Surgical absence of the gallbladder. No bile duct dilatation. The unenhanced appearance of the liver, spleen, pancreas, adrenal glands, inferior vena cava, and retroperitoneal lymph nodes is unremarkable. Calcification of the abdominal aorta without aneurysm. Nonobstructing stone in the lower pole right kidney measuring 4.6 mm diameter. No hydronephrosis in either kidney. No ureteral stones identified. Postoperative changes with right colon resection and ileal ascending colonic anastomosis. There is also to anastomosis in the distal small bowel. Moderately dilated fluid in gas-filled small bowel loops are present with transition point in the right lower quadrant suggesting obstruction. The transition zone appears to be a segmental area of narrowing likely representing chronic stricture. Pattern is similar to the previous study. Multiple calcified objects are present throughout the small bowel consistent with ingested material, possibly bones. Interestingly, similar changes were present previously. The structures may be persistent since the previous study, indicating poor transit or the patient may be ingesting similar substances today as at the prior time. There is no acute wall  thickening or edema in the small bowel. Colon is stool filled without significant distention. No significant wall thickening in the colon to suggest acute inflammatory process. There scattered lymph nodes in the mesentery, likely reactive. No free air or free fluid in the abdomen.  Pelvis: Bladder wall is not thickened. Prostate gland is not enlarged. No free or loculated pelvic fluid collections. No pelvic mass or lymphadenopathy. No inflammatory changes suggested in the rectosigmoid colon. Degenerative changes in the lumbar spine. No destructive bone lesions present. Focal benign-appearing sclerosis in the right iliac bone.  IMPRESSION: Dilated fluid and gas-filled small bowel with transition zone in the right lower quadrant suggesting small bowel obstruction or possibly dysmotility. Retained calcific material within the small bowel, some of which may be with in a blind pouch. Either the same structures or summer structures were present on the prior study. Correlation with the patient's diet is recommended. Persistent finding of patchy airspace disease in the lower lungs. Small nonobstructing stone in the right kidney.  These results were called by telephone at the time of interpretation on 03/16/2014 at 9:21 pm to Dr. Debby Freiberg , who verbally acknowledged these results.   Electronically Signed   By: Gwyndolyn Saxon  Gerilyn Nestle M.D.   On: 03/16/2014 21:31   Dg Abd 1 View  03/17/2014   CLINICAL DATA:  NG tube placement  EXAM: ABDOMEN - 1 VIEW  COMPARISON:  12/12/2012  FINDINGS: Enteric tube appears to be coiled in the lower esophagus. Mild gaseous dilatation of central bowel loops. Postoperative changes in the right abdomen. Degenerative changes in the spine.  IMPRESSION: Enteric tube appears to be coiled on the lower esophagus.  These results were called to the floor by the radiology technologist at the time of interpretation on 03/17/2014 at 1:07 am .   Electronically Signed   By: Lucienne Capers M.D.   On: 03/17/2014  01:13   Dg Abd Portable 1v  03/20/2014   CLINICAL DATA:  Small bowel obstruction. Personal history of Crohn's disease, prostate cancer, and diverticulitis.  EXAM: PORTABLE ABDOMEN - 1 VIEW  COMPARISON:  03/19/2014  FINDINGS: The nasogastric tube is been removed. Staple lines noted along the right abdominal bowel. Several calcifications projecting over the upper pelvis was previously shown to be an intraluminal calcified structure within the bowel.  No dilated bowel currently identified.  No free intraperitoneal gas.  IMPRESSION: 1. No dilated bowel currently noted. Calcifications projecting over the pelvis or previously shown to be intraluminal within dilated small bowel just proximal to an anastomotic staple line.   Electronically Signed   By: Sherryl Barters M.D.   On: 03/20/2014 18:38   Dg Abd Portable 1v  03/19/2014   CLINICAL DATA:  Bowel obstruction  EXAM: PORTABLE ABDOMEN - 1 VIEW  COMPARISON:  March 18, 2014  FINDINGS: Nasogastric tube tip and side port are in the distal stomach. There remain loops of dilated bowel in the mid abdomen without appreciable air-fluid level on this supine examination. No free air is seen on this supine examination. There are phleboliths in the pelvis. There are surgical clips in the right abdomen.  IMPRESSION: There remains bowel dilatation consistent with either ileus or a degree of persistent obstruction. No free air is seen on this supine examination. Nasogastric tube tip and side port in distal stomach.   Electronically Signed   By: Lowella Grip M.D.   On: 03/19/2014 08:48   Dg Abd Portable 1v  03/18/2014   CLINICAL DATA:  Small bowel obstruction and abdominal pain.  EXAM: PORTABLE ABDOMEN - 1 VIEW  COMPARISON:  03/17/2014  FINDINGS: Nasogastric tube extends into the upper abdomen but the tube is folded on itself and the tip is near the gastric cardia region. There continues to be dilated loops of small bowel in the lower abdomen. Overall, the degree of small  bowel distention has slightly decreased. There appears to be increased gas in the colon. Limited evaluation for free air on these supine images. Again noted are calcified structures in the right lower abdomen which are known to be within bowel loops.  IMPRESSION: Persistent dilated loops of small bowel in the abdomen. The degree of small bowel distention has minimally decreased. Slightly increased gas in the colon. Findings could represent a partial small bowel obstruction.  Nasogastric tube as described.   Electronically Signed   By: Markus Daft M.D.   On: 03/18/2014 10:38    Microbiology: Recent Results (from the past 240 hour(s))  Culture, Urine     Status: None   Collection Time: 03/16/14  8:15 PM  Result Value Ref Range Status   Specimen Description URINE, CLEAN CATCH  Final   Special Requests NONE  Final   Colony Count  Final    40,000 COLONIES/ML Performed at [facility]    Culture   Final    ENTEROCOCCUS SPECIES DIPHTHEROIDS(CORYNEBACTERIUM SPECIES) Note: Standardized susceptibility testing for this organism is not available. Performed at [facility]    Report Status 03/19/2014 FINAL  Final   Organism ID, Bacteria ENTEROCOCCUS SPECIES  Final      Susceptibility   Enterococcus species - MIC*    AMPICILLIN <=2 SENSITIVE Sensitive     LEVOFLOXACIN 1 SENSITIVE Sensitive     NITROFURANTOIN <=16 SENSITIVE Sensitive     VANCOMYCIN 2 SENSITIVE Sensitive     TETRACYCLINE >=16 RESISTANT Resistant     * ENTEROCOCCUS SPECIES     Labs: Basic Metabolic Panel:  Recent Labs Lab 03/16/14 1824 03/17/14 0653 03/18/14 0554 03/19/14 0409 03/20/14 0850 03/21/14 0640  NA 144 143 149* 148* 142  --   K 4.2 3.4* 3.2* 3.5 2.9* 4.2  CL 105 111 114* 116* 110  --   CO2 25 28 31 28 29   --   GLUCOSE 161* 148* 139* 119* 108*  --   BUN 24* 26* 18 16 12   --   CREATININE 2.49* 1.75* 1.38* 1.24 1.14  --   CALCIUM 9.5 7.8* 8.1* 8.1* 8.2*  --    Liver Function Tests:  Recent  Labs Lab 03/16/14 1824 03/17/14 0653  AST 50* 27  ALT 26 16  ALKPHOS 59 43  BILITOT 1.6* 0.7  PROT 6.7 5.0*  ALBUMIN 3.1* 2.2*    Recent Labs Lab 03/16/14 1824  LIPASE 21   CBC:  Recent Labs Lab 03/16/14 1824 03/17/14 0653  WBC 18.9* 12.2*  NEUTROABS 15.9* 9.8*  HGB 14.7 11.7*  HCT 43.4 35.8*  MCV 92.9 93.7  PLT 320 242   Cardiac Enzymes:  Recent Labs Lab 03/17/14 0653  TROPONINI 0.04*   CBG:  Recent Labs Lab 03/19/14 0548 03/19/14 1148 03/19/14 1814 03/19/14 2348 03/20/14 0539  GLUCAP 119* 135* 135* 93 96    Signed:  Ladaja Yusupov  [facility] 03/21/2014, 2:10 PM

## 2014-03-21 NOTE — Discharge Instructions (Signed)
Follow with [name], MD in 5-7 days  Please get a complete blood count and chemistry panel checked by your Primary MD at your next visit, and again as instructed by your Primary MD. Please get your medications reviewed and adjusted by your Primary MD.  Please request your Primary MD to go over all Hospital Tests and Procedure/Radiological results at the follow up, please get all Hospital records sent to your Prim MD by signing hospital release before you go home.  If you had Pneumonia of Lung problems at the Hospital: Please get a 2 view Chest X ray done in 6-8 weeks after hospital discharge or sooner if instructed by your Primary MD.  If you have Congestive Heart Failure: Please call your Cardiologist or Primary MD anytime you have any of the following symptoms:  1) 3 pound weight gain in 24 hours or 5 pounds in 1 week  2) shortness of breath, with or without a dry hacking cough  3) swelling in the hands, feet or stomach  4) if you have to sleep on extra pillows at night in order to breathe  Follow cardiac low salt diet and 1.5 lit/day fluid restriction.  If you have diabetes Accuchecks 4 times/day, Once in AM empty stomach and then before each meal. Log in all results and show them to your primary doctor at your next visit. If any glucose reading is under 80 or above 300 call your primary MD immediately.  If you have Seizure/Convulsions/Epilepsy: Please do not drive, operate heavy machinery, participate in activities at heights or participate in high speed sports until you have seen by Primary MD or a Neurologist and advised to do so again.  If you had Gastrointestinal Bleeding: Please ask your Primary MD to check a complete blood count within one week of discharge or at your next visit. Your endoscopic/colonoscopic biopsies that are pending at the time of discharge, will also need to followed by your Primary MD.  Get Medicines reviewed and adjusted. Please take all  your medications with you for your next visit with your Primary MD  Please request your Primary MD to go over all hospital tests and procedure/radiological results at the follow up, please ask your Primary MD to get all Hospital records sent to his/her office.  If you experience worsening of your admission symptoms, develop shortness of breath, life threatening emergency, suicidal or homicidal thoughts you must seek medical attention immediately by calling 911 or calling your MD immediately  if symptoms less severe.  You must read complete instructions/literature along with all the possible adverse reactions/side effects for all the Medicines you take and that have been prescribed to you. Take any new Medicines after you have completely understood and accpet all the possible adverse reactions/side effects.   Do not drive or operate heavy machinery when taking Pain medications.   Do not take more than prescribed Pain, Sleep and Anxiety Medications  Special Instructions: If you have smoked or chewed Tobacco  in the last 2 yrs please stop smoking, stop any regular Alcohol  and or any Recreational drug use.  Wear Seat belts while driving.  Please note You were cared for by a hospitalist during your hospital stay. If you have any questions about your discharge medications or the care you received while you were in the hospital after you are discharged, you can call the unit and asked to speak with the hospitalist on call if the hospitalist that took care of you is not available. Once  you are discharged, your primary care physician will handle any further medical issues. Please note that NO REFILLS for any discharge medications will be authorized once you are discharged, as it is imperative that you return to your primary care physician (or establish a relationship with a primary care physician if you do not have one) for your aftercare needs so that they can reassess your need for medications and monitor  your lab values.  You can reach the hospitalist office at phone [phone] or fax [phone]   If you do not have a primary care physician, you can call [phone] for a physician referral.  Activity: As tolerated with Full fall precautions use walker/cane & assistance as needed  Diet: regular  Disposition Home

## 2014-03-21 NOTE — Progress Notes (Signed)
Discharge instructions given to patient and his wife.  They verbalized understanding of all instructions and follow-up information.  No questions or concerns at this time.  Pt ready for discharge home with wife.  Taken down via wheelchair to be discharged.

## 2014-03-31 ENCOUNTER — Telehealth: Payer: Self-pay | Admitting: *Deleted

## 2014-03-31 NOTE — Telephone Encounter (Signed)
Left a message for patient to call back. 

## 2014-03-31 NOTE — Telephone Encounter (Signed)
-----   Message from Hulan Saas, RN sent at 10/06/2013  9:31 AM EDT ----- Call and remind patient B12 level due on 04/04/14 for DB. Lab in [facility]

## 2014-04-05 NOTE — Telephone Encounter (Signed)
No return call from the patient.  Letter mailed

## 2014-04-06 ENCOUNTER — Telehealth: Payer: Self-pay | Admitting: Internal Medicine

## 2014-04-06 NOTE — Telephone Encounter (Signed)
Patient had been contacted to remind him of the B12 injection due date. He states at the last hospitalization, he began B12 SL daily and he will not be taking the B12 injections at this time. He has a hospital follow up appointment scheduled with his PCP 04/08/14.

## 2014-04-11 ENCOUNTER — Telehealth: Payer: Self-pay | Admitting: Internal Medicine

## 2014-04-11 NOTE — Telephone Encounter (Signed)
Patient states he had his B12 level checked at his PCP last week. He states his PCP will send [place] those labs.

## 2014-05-21 ENCOUNTER — Other Ambulatory Visit: Payer: Self-pay | Admitting: Internal Medicine

## 2014-06-04 ENCOUNTER — Other Ambulatory Visit: Payer: Self-pay | Admitting: Neurology

## 2014-06-05 NOTE — Telephone Encounter (Signed)
Last seen in 2014

## 2014-06-09 ENCOUNTER — Encounter: Payer: Self-pay | Admitting: Neurology

## 2014-06-09 ENCOUNTER — Ambulatory Visit (INDEPENDENT_AMBULATORY_CARE_PROVIDER_SITE_OTHER): Payer: Medicare Other | Admitting: Neurology

## 2014-06-09 VITALS — BP 136/83 | HR 86 | Resp 16 | Ht 71.0 in | Wt 166.6 lb

## 2014-06-09 DIAGNOSIS — G459 Transient cerebral ischemic attack, unspecified: Secondary | ICD-10-CM

## 2014-06-09 DIAGNOSIS — K50812 Crohn's disease of both small and large intestine with intestinal obstruction: Secondary | ICD-10-CM

## 2014-06-09 NOTE — Progress Notes (Signed)
[facility]  Provider:  Larey Seat, M D  Referring Provider: Leighton Ruff, MD Primary Care Physician:  [name], MD  Chief Complaint  Patient presents with  . RV  TIA / needed refill    Rm 10, wife  (relayed that can get tricor thru pcp)    HPI:  Ricardo Villa is a 80 y.o. male  Is seen here as a referral from Dr. Drema Dallas for an established patient of Dr. Doy Mince, whom he saw after a TIA in 2006 until 2012.  The patient originally was seen in December 2006 after he had a brief onset of symptoms of left sided numbness and weakness of the leg prominently more weakness than in the arm.  The duration of the spell was about 20 minutes. Normal imaging  and it was decided that the symptoms were consistent with a T. I A. He did have only mild white matter disease and negative 2-D echo at that time a negative bubble study and negative carotid Dopplers.  He was initially placed on Aggrenox but 2 headaches changed to a combination of aspirin and Plavix. He was also given Metanex as he was found to have hyperhomocysteinemia.  He has followed yearly with Cecille Rubin, NP and never had another event.   06-09-14 In his interval history would like to mention that the patient was hospitalized October 2014 is acute dehydration after diarrhea related to Crohn's disease.  He also had vomiting and severe nausea at the time. Colonoscopy with Dr. Maurene Capes , anastomosis site reviewed, he developed another repeat occlusion at the anastomosis site of the duodenum , Sedated by nausea and dehydration, vomiting. This occurred for the second time again Jan 2016. He lost weight to 160 pounds in the hospital.       Review of Systems: Out of a complete 14 system review, the patient complains of only the following symptoms, and all other reviewed systems are negative.  Of a review of systems the patient and/or his but he does not use tobacco, alcohol or recreational  drugs.   But he has Crohn's disease, and occasionally hip arthritis problems. In January 2016 he had a duodenal blockage , according to Dr Olevia Perches.  Nausea , dehydration .  Denies any loss of smell, loss of taste, myalgia or chest pain or leg swelling.  History   Social History  . Marital Status: Married    Spouse Name: Occupational hygienist  . Number of Children: 2  . Years of Education: college   Occupational History  . Retired    Social History Main Topics  . Smoking status: Former Research scientist (life sciences)  . Smokeless tobacco: Never Used  . Alcohol Use: Yes     Comment: occasionally  . Drug Use: No  . Sexual Activity: Not on file   Other Topics Concern  . Not on file   Social History Narrative   Caffeine 2 cups daily. De caff tea    Family History  Problem Relation Age of Onset  . Crohn's disease Mother   . Heart disease Father   . Stroke Father   . Diabetes Paternal Grandfather   . Colon cancer Neg Hx   . Esophageal cancer Neg Hx   . Rectal cancer Neg Hx   . Stomach cancer Neg Hx     Past Medical History  Diagnosis Date  . Hyperhomocysteinemia   . Hyperlipidemia   . Small bowel obstruction   . History of adenomatous polyp of colon   . Vitamin  B12 deficiency   . Crohn disease   . Prostate cancer   . GERD (gastroesophageal reflux disease)   . Diverticulosis   . Dehydration 03/01/2013    Diarrhea due to crohn's disease.   Marland Kitchen TIA (transient ischemic attack)     HX OF   . SBO (small bowel obstruction) 03/17/2014    Past Surgical History  Procedure Laterality Date  . Cholecystectomy    . Terminal ileum resection    . Appendectomy    . Cataract extraction      both eyes    Current Outpatient Prescriptions  Medication Sig Dispense Refill  . aspirin 81 MG tablet Take 81 mg by mouth daily.    . B-12, Methylcobalamin, 1000 MCG SUBL Place 1 tablet under the tongue daily.    . diphenoxylate-atropine (LOMOTIL) 2.5-0.025 MG per tablet TAKE ONE TABLET 1 OR 2 TIMES DAILY AS NEEDED 60  tablet 2  . fenofibrate (TRICOR) 48 MG tablet TAKE ONE TABLET EACH DAY 90 tablet 1  . ferrous fumarate (HEMOCYTE - 106 MG FE) 325 (106 FE) MG TABS tablet Take 1 tablet by mouth daily.    . mercaptopurine (PURINETHOL) 50 MG tablet TAKE 1 TABLET DAILY. GIVE ON AN EMPTY STOMACH 1 HOUR BEFORE OR 2 HOURS AFTER MEALS. CAUTION: CHEMOTHERAPY. 30 tablet 3  . Probiotic Product (PROBIOTIC DAILY PO) Take 1 tablet by mouth daily.    . ranitidine (ZANTAC) 150 MG tablet Take 150 mg by mouth daily as needed for heartburn.     No current facility-administered medications for this visit.    Allergies as of 06/09/2014  . (No Known Allergies)    Vitals: BP 136/83 mmHg  Pulse 86  Resp 16  Ht 5' 11"  (1.803 m)  Wt 166 lb 9.6 oz (75.569 kg)  BMI 23.25 kg/m2 Last Weight:  Wt Readings from Last 1 Encounters:  06/09/14 166 lb 9.6 oz (75.569 kg)   Last Height:   Ht Readings from Last 1 Encounters:  06/09/14 5' 11"  (1.803 m)    Physical exam:  General: The patient is awake, alert and appears not in acute distress. The patient is well groomed. Head: Normocephalic, atraumatic. Neck is supple. Mallampati 2, neck circumference: 15, nasal passage unrestricted, no hearing loss.  Cardiovascular:  Regular rate and rhythm , without  murmurs or carotid bruit, and without distended neck veins. Respiratory: Lungs are clear to auscultation. Skin:  Without evidence of edema, or rash Trunk: BMI is normal,  posture.  Neurologic exam : The patient is awake and alert, oriented to place and time.  Memory subjective  described as intact.   There is a normal attention span & concentration ability. Speech is fluent without  dysarthria, dysphonia or aphasia.  Mood and affect are appropriate.he seems to frequently lip smack, not quite a tick.   Cranial nerves: Pupils are equal and briskly reactive to light. Funduscopic exam without  evidence of pallor or edema.  Extraocular movements  in vertical and horizontal planes  intact and without nystagmus.  Visual fields by finger perimetry are intact. Hearing to finger rub diminished, he has hearing aids , does not wear them.   Facial sensation intact to fine touch. Facial motor strength is symmetric and tongue and uvula move midline.  Motor exam:   Normal tone and  muscle bulk and symmetric strength in all extremities.  Sensory:  Fine touch, pinprick and vibration were tested in all extremities. Proprioception is normal.  Coordination: Rapid alternating movements in the fingers/hands is  tested and normal.  Finger-to-nose maneuver tested and normal without evidence of ataxia, dysmetria or tremor.  Gait and station: Patient walks without assistive device . Strength within normal limits. Stance is stable and normal.  Steps are unfragmented. Romberg testing is  normal.  Deep tendon reflexes: in the  upper and lower extremities are symmetric and intact. Babinski maneuver response is downgoing:    Assessment:  After physical and neurologic examination, review of laboratory studies, imaging, neurophysiology testing and pre-existing records, assessment is  !) remote TIA history . The patient had no repeat TIA event since being last seen but he was dehydrated nauseated and actually vomited in response to a duodenal anastomosis occlusion.  This was treated in January of this year 2016, by Dr. Elicia Lamp. He is now and the slow process of regaining weight. He is here for a refill of his medication   Plan:  Treatment plan and additional workup : no follow up needed. Patient can be maintained on medication by his PCP.   I feel that he is safe to discontinue the TriCor fetal fibroid at 48 mg a day. He had in the past-homocysteine levels and high triglyceride levels. I think that he is basically fine to discontinue the medication as long as he has a sensible diet at this time he needs to gain some weight back. I think he is most likely to achieve this was protein rich  diet.

## 2014-06-16 ENCOUNTER — Other Ambulatory Visit: Payer: Self-pay | Admitting: Neurology

## 2014-06-17 NOTE — Telephone Encounter (Signed)
Last OV note says:  Treatment plan and additional workup : no follow up needed. Patient can be maintained on medication by his PCP.  I feel that he is safe to discontinue the TriCor

## 2014-06-18 ENCOUNTER — Other Ambulatory Visit: Payer: Self-pay | Admitting: Internal Medicine

## 2014-06-20 NOTE — Telephone Encounter (Signed)
Sent Rx for mercaptopurine (PURINETHOL), 50 mg, #30 with 3 refills to [facility] on 06/20/14.

## 2014-08-02 ENCOUNTER — Encounter: Payer: Self-pay | Admitting: Internal Medicine

## 2014-09-05 ENCOUNTER — Other Ambulatory Visit: Payer: Self-pay

## 2014-10-14 ENCOUNTER — Encounter: Payer: Self-pay | Admitting: Internal Medicine

## 2014-10-14 ENCOUNTER — Ambulatory Visit (INDEPENDENT_AMBULATORY_CARE_PROVIDER_SITE_OTHER): Payer: Medicare Other | Admitting: Internal Medicine

## 2014-10-14 VITALS — BP 118/70 | HR 80 | Ht 71.0 in | Wt 166.4 lb

## 2014-10-14 DIAGNOSIS — K5080 Crohn's disease of both small and large intestine without complications: Secondary | ICD-10-CM

## 2014-10-14 NOTE — Progress Notes (Signed)
Ricardo Villa [birth year] [id number]  Note: This dictation was prepared with Dragon digital system. Any transcriptional errors that result from this procedure are unintentional.   History of Present Illness: This is a 80 year old white male with the Crohn's disease of the terminal ileum. Ileocolic resection in 7564 and again in 1995. Small bowel obstruction in October 2014 which responded to bowel rest and steroids.. Most recent hospitalization for small bowel obstruction was in January 2016 when he developed severe dehydration and acute renal failure. He responded to bowel rest and intravenous hydration. Last colonoscopy in December 2014 showed 2 adenomatous polyps. One of the polyps waslarge sessile polyp in the right colon which was piecemeal removed but the base was left behind there was no dysplasia. Recall colonoscopy was not  determined due to his advanced age and general frail medical condition. At some point he may need  another colonoscopy to try to remove the rest of the base of the polyp. I was unable to see the terminal ileum, the anastomosis was rather angulated and is most likely the site of the small bowel obstruction. Since his hospitalization in January patient has done excellent. He regained his weight .He has not had any abdominal pain ,diarrhea, weight loss or vomiting. He is on 6 MP 50 mg daily, he is due for blood tests today    Past Medical History  Diagnosis Date  . Hyperhomocysteinemia   . Hyperlipidemia   . Small bowel obstruction   . History of adenomatous polyp of colon   . Vitamin B12 deficiency   . Crohn disease   . Prostate cancer   . GERD (gastroesophageal reflux disease)   . Diverticulosis   . Dehydration 03/01/2013    Diarrhea due to crohn's disease.   Marland Kitchen TIA (transient ischemic attack)     HX OF   . SBO (small bowel obstruction) 03/17/2014    Past Surgical History  Procedure Laterality Date  . Cholecystectomy    . Terminal ileum resection    .  Appendectomy    . Cataract extraction      both eyes    No Known Allergies  Family history and social history have been reviewed.  Review of Systems:   The remainder of the 10 point ROS is negative except as outlined in the H&P  Physical Exam: General Appearance Well developed, in no distress Eyes  Non icteric  HEENT  Non traumatic, normocephalic  Mouth No lesion, tongue papillated, no cheilosis Neck Supple without adenopathy, thyroid not enlarged, no carotid bruits, no JVD Lungs Clear to auscultation bilaterally COR Normal S1, normal S2, regular rhythm, no murmur, quiet precordium Abdomen soft, nontender. Increased bowel sounds. Mild tympany. No tenderness Rectal not done Extremities  No pedal edema Skin No lesions Neurological Alert and oriented x 3 Psychological Normal mood and affect  Assessment and Plan:   80 year old white male with Crohn's disease at the ileocolic anastomosis and periodic small bowel obstructions likely due to fibrosis at the ileocolic anastomosis. He is not  interested in surgical intervention or in biologicals. He will continue on 6-MP and will follow up with Dr. Havery Moros for general GI care B12 deficiency. We will be checking B12 levels he wants to have it done at Dr. Drema Dallas office   Chronic iron deficiency anemia on iron supplements. We will be checking iron levels next week as well.  Adenomatous polyps of the colon. It will be hard to get him consented to another colonoscopy. He has been reluctant to consider  that      Ricardo Villa 10/14/2014

## 2014-10-14 NOTE — Patient Instructions (Signed)
Your physician has requested that you lab work with your primary care doctor

## 2014-10-15 ENCOUNTER — Other Ambulatory Visit: Payer: Self-pay | Admitting: Internal Medicine

## 2014-11-10 ENCOUNTER — Other Ambulatory Visit: Payer: Self-pay | Admitting: Emergency Medicine

## 2014-11-10 MED ORDER — DIPHENOXYLATE-ATROPINE 2.5-0.025 MG PO TABS: ORAL_TABLET | ORAL | Status: DC

## 2014-11-10 NOTE — Telephone Encounter (Signed)
Rx approved by Dr. Hilarie Fredrickson. Faxed into [facility].

## 2015-02-07 ENCOUNTER — Encounter (HOSPITAL_COMMUNITY): Payer: Self-pay | Admitting: Emergency Medicine

## 2015-02-07 ENCOUNTER — Inpatient Hospital Stay (HOSPITAL_COMMUNITY)
Admission: EM | Admit: 2015-02-07 | Discharge: 2015-02-10 | DRG: 386 | Disposition: A | Discharge status: Home / Self Care | Payer: Medicare Other | Attending: Internal Medicine | Admitting: Internal Medicine

## 2015-02-07 DIAGNOSIS — K50012 Crohn's disease of small intestine with intestinal obstruction: Principal | ICD-10-CM | POA: Diagnosis present

## 2015-02-07 DIAGNOSIS — K56609 Unspecified intestinal obstruction, unspecified as to partial versus complete obstruction: Secondary | ICD-10-CM

## 2015-02-07 DIAGNOSIS — Z79899 Other long term (current) drug therapy: Secondary | ICD-10-CM

## 2015-02-07 DIAGNOSIS — Z8673 Personal history of transient ischemic attack (TIA), and cerebral infarction without residual deficits: Secondary | ICD-10-CM

## 2015-02-07 DIAGNOSIS — D72829 Elevated white blood cell count, unspecified: Secondary | ICD-10-CM | POA: Diagnosis present

## 2015-02-07 DIAGNOSIS — Z87891 Personal history of nicotine dependence: Secondary | ICD-10-CM

## 2015-02-07 DIAGNOSIS — R1084 Generalized abdominal pain: Secondary | ICD-10-CM

## 2015-02-07 DIAGNOSIS — Z8546 Personal history of malignant neoplasm of prostate: Secondary | ICD-10-CM

## 2015-02-07 DIAGNOSIS — E785 Hyperlipidemia, unspecified: Secondary | ICD-10-CM | POA: Diagnosis present

## 2015-02-07 DIAGNOSIS — Z9842 Cataract extraction status, left eye: Secondary | ICD-10-CM

## 2015-02-07 DIAGNOSIS — Z0189 Encounter for other specified special examinations: Secondary | ICD-10-CM

## 2015-02-07 DIAGNOSIS — K219 Gastro-esophageal reflux disease without esophagitis: Secondary | ICD-10-CM | POA: Diagnosis present

## 2015-02-07 DIAGNOSIS — R112 Nausea with vomiting, unspecified: Secondary | ICD-10-CM

## 2015-02-07 DIAGNOSIS — Z4659 Encounter for fitting and adjustment of other gastrointestinal appliance and device: Secondary | ICD-10-CM

## 2015-02-07 DIAGNOSIS — K50019 Crohn's disease of small intestine with unspecified complications: Secondary | ICD-10-CM | POA: Diagnosis present

## 2015-02-07 DIAGNOSIS — K50919 Crohn's disease, unspecified, with unspecified complications: Secondary | ICD-10-CM

## 2015-02-07 DIAGNOSIS — Z9841 Cataract extraction status, right eye: Secondary | ICD-10-CM

## 2015-02-07 DIAGNOSIS — E876 Hypokalemia: Secondary | ICD-10-CM | POA: Diagnosis present

## 2015-02-07 DIAGNOSIS — Z7982 Long term (current) use of aspirin: Secondary | ICD-10-CM

## 2015-02-07 LAB — URINALYSIS, ROUTINE W REFLEX MICROSCOPIC
Glucose, UA: NEGATIVE mg/dL
Hgb urine dipstick: NEGATIVE
Ketones, ur: 15 mg/dL — AB
Leukocytes, UA: NEGATIVE
NITRITE: NEGATIVE
PH: 5 (ref 5.0–8.0)
Protein, ur: 30 mg/dL — AB
SPECIFIC GRAVITY, URINE: 1.031 — AB (ref 1.005–1.030)

## 2015-02-07 LAB — COMPREHENSIVE METABOLIC PANEL
ALBUMIN: 3.5 g/dL (ref 3.5–5.0)
ALT: 15 U/L — ABNORMAL LOW (ref 17–63)
ANION GAP: 10 (ref 5–15)
AST: 24 U/L (ref 15–41)
Alkaline Phosphatase: 54 U/L (ref 38–126)
BUN: 12 mg/dL (ref 6–20)
CHLORIDE: 105 mmol/L (ref 101–111)
CO2: 26 mmol/L (ref 22–32)
Calcium: 9.8 mg/dL (ref 8.9–10.3)
Creatinine, Ser: 1.19 mg/dL (ref 0.61–1.24)
GFR calc Af Amer: >60 mL/min (ref >60)
GFR calc non Af Amer: 57 mL/min — ABNORMAL LOW (ref >60)
GLUCOSE: 132 mg/dL — AB (ref 65–99)
POTASSIUM: 3.8 mmol/L (ref 3.5–5.1)
Sodium: 141 mmol/L (ref 135–145)
Total Bilirubin: 1.6 mg/dL — ABNORMAL HIGH (ref 0.3–1.2)
Total Protein: 6.4 g/dL — ABNORMAL LOW (ref 6.5–8.1)

## 2015-02-07 LAB — CBC
HEMATOCRIT: 42.7 % (ref 39.0–52.0)
Hemoglobin: 14.6 g/dL (ref 13.0–17.0)
MCH: 31.3 pg (ref 26.0–34.0)
MCHC: 34.2 g/dL (ref 30.0–36.0)
MCV: 91.4 fL (ref 78.0–100.0)
Platelets: 234 10*3/uL (ref 150–400)
RBC: 4.67 MIL/uL (ref 4.22–5.81)
RDW: 14.1 % (ref 11.5–15.5)
WBC: 17 10*3/uL — ABNORMAL HIGH (ref 4.0–10.5)

## 2015-02-07 LAB — URINE MICROSCOPIC-ADD ON: RBC / HPF: NONE SEEN RBC/hpf (ref 0–5)

## 2015-02-07 LAB — LIPASE, BLOOD: LIPASE: 24 U/L (ref 11–51)

## 2015-02-07 NOTE — ED Notes (Addendum)
Pt states he had a sudden onset of lower abd pain around 1230 this afternoon. With vomiting pt states he has vomited 5 times and at this time pt has no pain. Pt reports hx of crohns  diease.

## 2015-02-07 NOTE — ED Notes (Signed)
Reports that he came in b/c last time he waiting and got "over dehydrated" and did not want that to happen again.

## 2015-02-08 ENCOUNTER — Inpatient Hospital Stay (HOSPITAL_COMMUNITY): Payer: Medicare Other

## 2015-02-08 ENCOUNTER — Emergency Department (HOSPITAL_COMMUNITY): Payer: Medicare Other

## 2015-02-08 ENCOUNTER — Encounter (HOSPITAL_COMMUNITY): Payer: Self-pay | Admitting: Radiology

## 2015-02-08 DIAGNOSIS — Z79899 Other long term (current) drug therapy: Secondary | ICD-10-CM | POA: Diagnosis not present

## 2015-02-08 DIAGNOSIS — K5669 Other intestinal obstruction: Secondary | ICD-10-CM | POA: Diagnosis not present

## 2015-02-08 DIAGNOSIS — Z9841 Cataract extraction status, right eye: Secondary | ICD-10-CM | POA: Diagnosis not present

## 2015-02-08 DIAGNOSIS — E876 Hypokalemia: Secondary | ICD-10-CM | POA: Diagnosis not present

## 2015-02-08 DIAGNOSIS — D72829 Elevated white blood cell count, unspecified: Secondary | ICD-10-CM | POA: Diagnosis present

## 2015-02-08 DIAGNOSIS — Z8546 Personal history of malignant neoplasm of prostate: Secondary | ICD-10-CM | POA: Diagnosis not present

## 2015-02-08 DIAGNOSIS — K219 Gastro-esophageal reflux disease without esophagitis: Secondary | ICD-10-CM | POA: Diagnosis present

## 2015-02-08 DIAGNOSIS — K50012 Crohn's disease of small intestine with intestinal obstruction: Secondary | ICD-10-CM | POA: Diagnosis present

## 2015-02-08 DIAGNOSIS — R1084 Generalized abdominal pain: Secondary | ICD-10-CM | POA: Diagnosis present

## 2015-02-08 DIAGNOSIS — Z9842 Cataract extraction status, left eye: Secondary | ICD-10-CM | POA: Diagnosis not present

## 2015-02-08 DIAGNOSIS — Z7982 Long term (current) use of aspirin: Secondary | ICD-10-CM | POA: Diagnosis not present

## 2015-02-08 DIAGNOSIS — Z8673 Personal history of transient ischemic attack (TIA), and cerebral infarction without residual deficits: Secondary | ICD-10-CM | POA: Diagnosis not present

## 2015-02-08 DIAGNOSIS — E785 Hyperlipidemia, unspecified: Secondary | ICD-10-CM | POA: Diagnosis present

## 2015-02-08 DIAGNOSIS — K50019 Crohn's disease of small intestine with unspecified complications: Secondary | ICD-10-CM | POA: Diagnosis present

## 2015-02-08 DIAGNOSIS — Z87891 Personal history of nicotine dependence: Secondary | ICD-10-CM | POA: Diagnosis not present

## 2015-02-08 MED ORDER — SODIUM CHLORIDE 0.9 % IV BOLUS (SEPSIS)
1000.0000 mL | Freq: Once | INTRAVENOUS | Status: AC
  Administered 2015-02-08: 1000 mL via INTRAVENOUS

## 2015-02-08 MED ORDER — ASPIRIN 81 MG PO CHEW
81.0000 mg | CHEWABLE_TABLET | Freq: Every day | ORAL | Status: DC
  Administered 2015-02-09 – 2015-02-10 (×2): 81 mg via ORAL
  Filled 2015-02-08 (×2): qty 1

## 2015-02-08 MED ORDER — IOHEXOL 300 MG/ML  SOLN
100.0000 mL | Freq: Once | INTRAMUSCULAR | Status: AC | PRN
  Administered 2015-02-08: 100 mL via INTRAVENOUS

## 2015-02-08 MED ORDER — MORPHINE SULFATE (PF) 2 MG/ML IV SOLN: 2.0000 mg | INTRAVENOUS | Status: DC | PRN

## 2015-02-08 MED ORDER — SODIUM CHLORIDE 0.9 % IV SOLN
INTRAVENOUS | Status: DC
  Administered 2015-02-08 (×2): via INTRAVENOUS

## 2015-02-08 MED ORDER — ONDANSETRON HCL 4 MG/2ML IJ SOLN
4.0000 mg | Freq: Once | INTRAMUSCULAR | Status: AC
  Administered 2015-02-08: 4 mg via INTRAVENOUS
  Filled 2015-02-08: qty 2

## 2015-02-08 MED ORDER — DEXTROSE 5 % IV SOLN
2.0000 g | INTRAVENOUS | Status: DC
  Administered 2015-02-08 – 2015-02-10 (×3): 2 g via INTRAVENOUS
  Filled 2015-02-08 (×3): qty 2

## 2015-02-08 MED ORDER — BARIUM SULFATE 2.1 % PO SUSP
ORAL | Status: AC
  Filled 2015-02-08: qty 1

## 2015-02-08 MED ORDER — METHYLPREDNISOLONE SODIUM SUCC 125 MG IJ SOLR
60.0000 mg | Freq: Every day | INTRAMUSCULAR | Status: DC
  Administered 2015-02-08 – 2015-02-10 (×3): 60 mg via INTRAVENOUS
  Filled 2015-02-08 (×3): qty 2

## 2015-02-08 MED ORDER — ONDANSETRON HCL 4 MG/2ML IJ SOLN: 4.0000 mg | Freq: Four times a day (QID) | INTRAMUSCULAR | Status: DC | PRN

## 2015-02-08 MED ORDER — SODIUM CHLORIDE 0.9 % IV SOLN
INTRAVENOUS | Status: DC
  Administered 2015-02-08: 03:00:00 via INTRAVENOUS

## 2015-02-08 MED ORDER — METRONIDAZOLE IN NACL 5-0.79 MG/ML-% IV SOLN
500.0000 mg | Freq: Four times a day (QID) | INTRAVENOUS | Status: DC
  Administered 2015-02-08 – 2015-02-10 (×9): 500 mg via INTRAVENOUS
  Filled 2015-02-08 (×12): qty 100

## 2015-02-08 MED ORDER — HEPARIN SODIUM (PORCINE) 5000 UNIT/ML IJ SOLN
5000.0000 [IU] | Freq: Three times a day (TID) | INTRAMUSCULAR | Status: DC
  Administered 2015-02-08 – 2015-02-10 (×8): 5000 [IU] via SUBCUTANEOUS
  Filled 2015-02-08 (×8): qty 1

## 2015-02-08 NOTE — Care Management Note (Signed)
Case Management Note  Patient Details  Name: Ricardo Villa MRN: [id number] Date of Birth: 1934/08/10  Subjective/Objective:                    Action/Plan: Patient was admitted with small bowel obstruction. Lives at home with wife.  Will follow for discharge needs.  Expected Discharge Date:                  Expected Discharge Plan:     In-House Referral:     Discharge planning Services     Post Acute Care Choice:    Choice offered to:     DME Arranged:    DME Agency:     HH Arranged:    HH Agency:     Status of Service:  In process, will continue to follow  Medicare Important Message Given:    Date Medicare IM Given:    Medicare IM give by:    Date Additional Medicare IM Given:    Additional Medicare Important Message give by:     If discussed at [place] of Stay Meetings, dates discussed:    Additional Comments:  Rolm Baptise, RN 02/08/2015, 2:22 PM

## 2015-02-08 NOTE — H&P (Signed)
[facility] History and Physical  Ricardo Villa OVF:[id number] DOB: [birth year] DOA: 02/07/2015  Referring physician: EDP PCP: [name], MD   Chief Complaint: N/V, abd pain   HPI: Ricardo Villa is a 80 y.o. male with h/o crohn's disease, prior SBO.  Patient presents to the ED with c/o sudden onset lower abd pain around 1230 early yesterday afternoon.  Multiple episodes of N/V at home and 1 episode of vomiting here in ED.  Has had similar presentations with Crohn's flare causing SBO in the past as well.  Work up in the ED shows SBO on CT scan, probably secondary to Crohn's flare.  Review of Systems: Systems reviewed.  As above, otherwise negative  Past Medical History  Diagnosis Date  . Hyperhomocysteinemia ([place])   . Hyperlipidemia   . Small bowel obstruction ([place])   . History of adenomatous polyp of colon   . Vitamin B12 deficiency   . Crohn disease ([place])   . Prostate cancer ([place])   . GERD (gastroesophageal reflux disease)   . Diverticulosis   . Dehydration 03/01/2013    Diarrhea due to crohn's disease.   Marland Kitchen TIA (transient ischemic attack)     HX OF   . SBO (small bowel obstruction) ([place]) 03/17/2014   Past Surgical History  Procedure Laterality Date  . Cholecystectomy    . Terminal ileum resection    . Appendectomy    . Cataract extraction      both eyes   Social History:  reports that he has quit smoking. He has never used smokeless tobacco. He reports that he drinks alcohol. He reports that he does not use illicit drugs.  No Known Allergies  Family History  Problem Relation Age of Onset  . Crohn's disease Mother   . Heart disease Father   . Stroke Father   . Diabetes Paternal Grandfather   . Colon cancer Neg Hx   . Esophageal cancer Neg Hx   . Rectal cancer Neg Hx   . Stomach cancer Neg Hx      Prior to Admission medications   Medication Sig Start Date End Date Taking? Authorizing Provider  aspirin 81 MG tablet Take 81 mg by  mouth daily.   Yes Historical Provider, MD  B-12, Methylcobalamin, 1000 MCG SUBL Place 1 tablet under the tongue daily.   Yes Historical Provider, MD  diphenoxylate-atropine (LOMOTIL) 2.5-0.025 MG per tablet TAKE ONE TABLET 1 OR 2 TIMES DAILY AS NEEDED Patient taking differently: Take 1 tablet by mouth See admin instructions. TAKE ONE TABLET 1 OR 2 TIMES DAILY AS NEEDED 11/10/14  Yes Jerene Bears, MD  fenofibrate 54 MG tablet Take 54 mg by mouth daily.   Yes Historical Provider, MD  mercaptopurine (PURINETHOL) 50 MG tablet TAKE ONE TABLET EACH DAY ON AN EMPTY STOMACH 1 HOUR BEFORE OR 2 HOURSAFTER MEALS CAUTION:CHEMOTHERAPY 10/17/14  Yes Lafayette Dragon, MD  Multiple Vitamins-Minerals (PRESERVISION AREDS 2 PO) Take 1 capsule by mouth daily.   Yes Historical Provider, MD   Physical Exam: Filed Vitals:   02/08/15 0200 02/08/15 0354  BP: 126/68 142/77  Pulse: 89 99  Temp:    Resp:  20    BP 142/77 mmHg  Pulse 99  Temp(Src) 98.1 F (36.7 C) (Oral)  Resp 20  Ht 5' 9"  (1.753 m)  Wt 73.483 kg (162 lb)  BMI 23.91 kg/m2  SpO2 93%  General Appearance:    Alert, oriented, no distress, appears stated age  Head:  Normocephalic, atraumatic  Eyes:    PERRL, EOMI, sclera non-icteric        Nose:   Nares without drainage or epistaxis. Mucosa, turbinates normal  Throat:   Moist mucous membranes. Oropharynx without erythema or exudate.  Neck:   Supple. No carotid bruits.  No thyromegaly.  No lymphadenopathy.   Back:     No CVA tenderness, no spinal tenderness  Lungs:     Clear to auscultation bilaterally, without wheezes, rhonchi or rales  Chest wall:    No tenderness to palpitation  Heart:    Regular rate and rhythm without murmurs, gallops, rubs  Abdomen:     Soft, non-tender, nondistended, normal bowel sounds, no organomegaly  Genitalia:    deferred  Rectal:    deferred  Extremities:   No clubbing, cyanosis or edema.  Pulses:   2+ and symmetric all extremities  Skin:   Skin color, texture,  turgor normal, no rashes or lesions  Lymph nodes:   Cervical, supraclavicular, and axillary nodes normal  Neurologic:   CNII-XII intact. Normal strength, sensation and reflexes      throughout    Labs on Admission:  Basic Metabolic Panel:  Recent Labs Lab 02/07/15 1814  NA 141  K 3.8  CL 105  CO2 26  GLUCOSE 132*  BUN 12  CREATININE 1.19  CALCIUM 9.8   Liver Function Tests:  Recent Labs Lab 02/07/15 1814  AST 24  ALT 15*  ALKPHOS 54  BILITOT 1.6*  PROT 6.4*  ALBUMIN 3.5    Recent Labs Lab 02/07/15 1814  LIPASE 24   No results for input(s): AMMONIA in the last 168 hours. CBC:  Recent Labs Lab 02/07/15 1814  WBC 17.0*  HGB 14.6  HCT 42.7  MCV 91.4  PLT 234   Cardiac Enzymes: No results for input(s): CKTOTAL, CKMB, CKMBINDEX, TROPONINI in the last 168 hours.  BNP (last 3 results) No results for input(s): PROBNP in the last 8760 hours. CBG: No results for input(s): GLUCAP in the last 168 hours.  Radiological Exams on Admission: Ct Abdomen Pelvis W Contrast  02/08/2015  CLINICAL DATA:  Sudden onset of lower abdominal pain around 12:30. Vomiting. EXAM: CT ABDOMEN AND PELVIS WITH CONTRAST TECHNIQUE: Multidetector CT imaging of the abdomen and pelvis was performed using the standard protocol following bolus administration of intravenous contrast. CONTRAST:  19m OMNIPAQUE IOHEXOL 300 MG/ML  SOLN COMPARISON:  03/16/2014 FINDINGS: There is marked dilatation of small bowel with transition point in the distal ileum approximately 10 cm from the ileocolic anastomosis. The final 10 cm of ileum is thickened with luminal narrowing and mild adjacent inflammatory stranding opacities. This suggests flare up of the known Crohn's disease. No mass is evident. There are multiple intraluminal calcified calculi within the dilated small bowel and these are almost completely unchanged from the 12/09/2012 examination. This suggests either a blind pouch or marked stasis of bowel  content. The colon is decompressed and unremarkable. There is prior cholecystectomy. The liver is unremarkable. There is mild dilatation of intrahepatic and extrahepatic bile ducts which may represent post cholecystectomy reservoir effect. There are unremarkable appearances of the pancreas, spleen and adrenals. There are nonobstructing 2-5 mm collecting system calculi bilaterally. There is no hydronephrosis or ureteral dilatation. The abdominal aorta is normal in caliber. There is mild atherosclerotic calcification. There is no adenopathy in the abdomen or pelvis. There is a small hiatal hernia. No significant musculoskeletal abnormality. IMPRESSION: 1. Small bowel obstruction. The transition point is at the beginning  of a 10 cm segment of narrowed and thickened distal ileum which may be associated with a Crohn's flare. 2. Multiple calcified calculi within the small bowel lumen, not significantly changed from previous studies dating back to 12/09/2012. 3. Bilateral nephrolithiasis. 4. Mild bile duct dilatation, possibly related to the prior cholecystectomy. 5. Hiatal hernia. Electronically Signed   By: Andreas Newport M.D.   On: 02/08/2015 03:01    EKG: Independently reviewed.  Assessment/Plan Principal Problem:   Crohn's disease of small intestine with intestinal obstruction (HCC) Active Problems:   SBO (small bowel obstruction) ([place])   1. SBO secondary to Crohn's flare - patient with history of same, the area of inflammation is in the exact same area that he has had trouble with Crohn's in the past (transition point is 10cm from anastomosis from prior surgery). 1. IV solumedrol 8m daily ordered 2. Surgery evaluating patient 3. NPO 4. IVF 5. zofran PRN nausea, morphine PRN pain 6. If he has further vomiting, may require NGT    Code Status: Full Code  Family Communication: Wife at bedside Disposition Plan: Admit to inpatient   Time spent: 722min  GARDNER, JARED M. [facility] Pager [phone] If 7AM-7PM, please contact the day team taking care of the patient [email or web address] Password [email or web address] 02/08/2015, 4:56 AM

## 2015-02-08 NOTE — ED Provider Notes (Signed)
CSN: [id number]     Arrival date & time 02/07/15  1716 History   First MD Initiated Contact with Patient 02/07/15 2338     Chief Complaint  Patient presents with  . Abdominal Pain     (Consider location/radiation/quality/duration/timing/severity/associated sxs/prior Treatment) The history is provided by the patient.     Ricardo Villa is a 80 y.o. maleWho presents for evaluation of nausea, vomiting and abdominal pain which started earlier today. He states it feels like his typical episodes of "Crohn's". He denies fever, chills, cough, shortness of breath, weakness or dizziness. He denies hematemesis or altered stooling. No recent episodes of Crohn's exacerbation. There are no other known modifying factors.    Past Medical History  Diagnosis Date  . Hyperhomocysteinemia ([place])   . Hyperlipidemia   . Small bowel obstruction ([place])   . History of adenomatous polyp of colon   . Vitamin B12 deficiency   . Crohn disease ([place])   . Prostate cancer ([place])   . GERD (gastroesophageal reflux disease)   . Diverticulosis   . Dehydration 03/01/2013    Diarrhea due to crohn's disease.   Marland Kitchen TIA (transient ischemic attack)     HX OF   . SBO (small bowel obstruction) ([place]) 03/17/2014   Past Surgical History  Procedure Laterality Date  . Cholecystectomy    . Terminal ileum resection    . Appendectomy    . Cataract extraction      both eyes   Family History  Problem Relation Age of Onset  . Crohn's disease Mother   . Heart disease Father   . Stroke Father   . Diabetes Paternal Grandfather   . Colon cancer Neg Hx   . Esophageal cancer Neg Hx   . Rectal cancer Neg Hx   . Stomach cancer Neg Hx    Social History  Substance Use Topics  . Smoking status: Former Research scientist (life sciences)  . Smokeless tobacco: Never Used  . Alcohol Use: Yes     Comment: occasionally    Review of Systems  All other systems reviewed and are negative.     Allergies  Review of patient's allergies indicates no known  allergies.  Home Medications   Prior to Admission medications   Medication Sig Start Date End Date Taking? Authorizing Provider  aspirin 81 MG tablet Take 81 mg by mouth daily.    Historical Provider, MD  B-12, Methylcobalamin, 1000 MCG SUBL Place 1 tablet under the tongue daily.    Historical Provider, MD  diphenoxylate-atropine (LOMOTIL) 2.5-0.025 MG per tablet TAKE ONE TABLET 1 OR 2 TIMES DAILY AS NEEDED 11/10/14   Jerene Bears, MD  ferrous fumarate (HEMOCYTE - 106 MG FE) 325 (106 FE) MG TABS tablet Take 1 tablet by mouth daily.    Historical Provider, MD  mercaptopurine (PURINETHOL) 50 MG tablet TAKE ONE TABLET EACH DAY ON AN EMPTY STOMACH 1 HOUR BEFORE OR 2 HOURSAFTER MEALS CAUTION:CHEMOTHERAPY 10/17/14   Lafayette Dragon, MD  Probiotic Product (PROBIOTIC DAILY PO) Take 1 tablet by mouth daily.    Historical Provider, MD   BP 154/73 mmHg  Pulse 98  Temp(Src) 98.1 F (36.7 C) (Oral)  Resp 16  Ht 5' 9"  (1.753 m)  Wt 162 lb (73.483 kg)  BMI 23.91 kg/m2  SpO2 98% Physical Exam  Constitutional: He is oriented to person, place, and time. He appears well-developed.  Elderly, frail  HENT:  Head: Normocephalic and atraumatic.  Right Ear: External ear normal.  Left Ear: External ear  normal.  Eyes: Conjunctivae and EOM are normal. Pupils are equal, round, and reactive to light.  Neck: Normal range of motion and phonation normal. Neck supple.  Cardiovascular: Normal rate, regular rhythm and normal heart sounds.   Pulmonary/Chest: Effort normal and breath sounds normal. He exhibits no bony tenderness.  Abdominal: Soft. He exhibits distension. He exhibits no mass. There is tenderness (diffuse, mild). There is no rebound and no guarding.  High-pitched hyperactive bowel sounds  Musculoskeletal: Normal range of motion.  Neurological: He is alert and oriented to person, place, and time. No cranial nerve deficit or sensory deficit. He exhibits normal muscle tone. Coordination normal.  Skin: Skin is  warm, dry and intact.  Psychiatric: He has a normal mood and affect. His behavior is normal. Judgment and thought content normal.  Nursing note and vitals reviewed.   ED Course  Procedures (including critical care time)  Medications  0.9 %  sodium chloride infusion (not administered)  Barium Sulfate 2.1 % SUSP (not administered)  sodium chloride 0.9 % bolus 1,000 mL (1,000 mLs Intravenous New Bag/Given 02/08/15 0029)  ondansetron (ZOFRAN) injection 4 mg (4 mg Intravenous Given 02/08/15 0029)    Patient Vitals for the past 24 hrs:  BP Temp Temp src Pulse Resp SpO2 Height Weight  02/08/15 0123 154/73 mmHg - - 98 16 98 % - -  02/07/15 2330 117/69 mmHg - - 105 - 95 % - -  02/07/15 2315 111/75 mmHg - - 90 - 99 % - -  02/07/15 2303 134/79 mmHg 98.1 F (36.7 C) - 103 18 99 % - -  02/07/15 1755 92/78 mmHg 98.7 F (37.1 C) Oral 100 16 96 % 5' 9"  (1.753 m) 162 lb (73.483 kg)   CT ordered, to evaluate for obstruction versus exacerbation of Crohn's  1:45 AM Reevaluation with update and discussion. After initial assessment and treatment, an updated evaluation reveals he states he is more comfortable now, not vomiting, has tolerated oral contrast and he does not have any abdominal pain at this time. Ricardo Villa L    Labs Review Labs Reviewed  COMPREHENSIVE METABOLIC PANEL - Abnormal; Notable for the following:    Glucose, Bld 132 (*)    Total Protein 6.4 (*)    ALT 15 (*)    Total Bilirubin 1.6 (*)    GFR calc non Af Amer 57 (*)    All other components within normal limits  CBC - Abnormal; Notable for the following:    WBC 17.0 (*)    All other components within normal limits  URINALYSIS, ROUTINE W REFLEX MICROSCOPIC (NOT AT [facility]) - Abnormal; Notable for the following:    Color, Urine AMBER (*)    APPearance CLOUDY (*)    Specific Gravity, Urine 1.031 (*)    Bilirubin Urine SMALL (*)    Ketones, ur 15 (*)    Protein, ur 30 (*)    All other components within normal limits  URINE  MICROSCOPIC-ADD ON - Abnormal; Notable for the following:    Squamous Epithelial / LPF 0-5 (*)    Bacteria, UA FEW (*)    Casts HYALINE CASTS (*)    All other components within normal limits  LIPASE, BLOOD    Imaging Review No results found. I have personally reviewed and evaluated these images and lab results as part of my medical decision-making.   EKG Interpretation None      MDM   Final diagnoses:  Generalized abdominal pain  Nausea and vomiting, vomiting of unspecified  type  Crohn's disease with complication, unspecified gastrointestinal tract location [facility])   nonspecific abdominal pain with vomiting, and history of Crohn's disease. CT ordered to evaluate for obstruction or Crohn's pathology.   Nursing Notes Reviewed/ Care Coordinated, and agree without changes. Applicable Imaging Reviewed.  Interpretation of Laboratory Data incorporated into ED treatment  Plan- is per oncoming provider team.   Daleen Bo, MD 02/08/15 1025

## 2015-02-08 NOTE — Consult Note (Signed)
Ricardo Villa Surgery Consult Note  Ricardo Villa [facility] December 01, 1934  [id number].    Requesting MD: Dr. Eulis Foster Chief Complaint/Reason for Consult: SBO 2* crohns  HPI:  80 y/o white male with PMH crohns disease and prior SBO's presents to [facility] with Crohn's disease flair which included sudden onset of mid lower abdominal pain which started around 35 yesterday afternoon (02/07/15), multiple episodes of non-bloody emesis and nausea between 10am and 2pm yesterday.  One episode of vomiting in ER today.  6/10 pain in ED.  Had a good BM yesterday and flatus, but none yet today.  Says the pain is improved today, but his abdomen still bloated.  Denies CP/SOB, fever/chills, headache, trouble urinating.  No radicular pain, no precipitating or alleviating factors.  CT shows SBO secondary to Crohn's flare.  NG has not been placed yet.  ROS: All systems reviewed and otherwise negative except for as above  Family History  Problem Relation Age of Onset  . Crohn's disease Mother   . Heart disease Father   . Stroke Father   . Diabetes Paternal Grandfather   . Colon cancer Neg Hx   . Esophageal cancer Neg Hx   . Rectal cancer Neg Hx   . Stomach cancer Neg Hx     Past Medical History  Diagnosis Date  . Hyperhomocysteinemia ([place])   . Hyperlipidemia   . Small bowel obstruction ([place])   . History of adenomatous polyp of colon   . Vitamin B12 deficiency   . Crohn disease ([place])   . Prostate cancer ([place])   . GERD (gastroesophageal reflux disease)   . Diverticulosis   . Dehydration 03/01/2013    Diarrhea due to crohn's disease.   Marland Kitchen TIA (transient ischemic attack)     HX OF   . SBO (small bowel obstruction) ([place]) 03/17/2014    Past Surgical History  Procedure Laterality Date  . Cholecystectomy    . Terminal ileum resection    . Appendectomy    . Cataract extraction      both eyes    Social History:  reports that he has quit smoking. He has never used smokeless tobacco. He reports that he  drinks alcohol. He reports that he does not use illicit drugs.  Allergies: No Known Allergies  Medications Prior to Admission  Medication Sig Dispense Refill  . aspirin 81 MG tablet Take 81 mg by mouth daily.    . B-12, Methylcobalamin, 1000 MCG SUBL Place 1 tablet under the tongue daily.    . diphenoxylate-atropine (LOMOTIL) 2.5-0.025 MG per tablet TAKE ONE TABLET 1 OR 2 TIMES DAILY AS NEEDED (Patient taking differently: Take 1 tablet by mouth See admin instructions. TAKE ONE TABLET 1 OR 2 TIMES DAILY AS NEEDED) 60 tablet 2  . fenofibrate 54 MG tablet Take 54 mg by mouth daily.    . mercaptopurine (PURINETHOL) 50 MG tablet TAKE ONE TABLET EACH DAY ON AN EMPTY STOMACH 1 HOUR BEFORE OR 2 HOURSAFTER MEALS CAUTION:CHEMOTHERAPY 30 tablet 3  . Multiple Vitamins-Minerals (PRESERVISION AREDS 2 PO) Take 1 capsule by mouth daily.      Blood pressure 143/70, pulse 104, temperature 98.7 F (37.1 C), temperature source Oral, resp. rate 18, height 5' 9"  (1.753 m), weight 76.613 kg (168 lb 14.4 oz), SpO2 94 %. Physical Exam: General: pleasant, WD/WN white male who is laying in bed in NAD HEENT: head is normocephalic, atraumatic.  Sclera are noninjected.  PERRL.  Ears and nose without any masses or lesions.  Mouth is pink  and moist Heart: regular, rate, and rhythm.  No obvious murmurs, gallops, or rubs noted.  Palpable pedal pulses bilaterally Lungs: CTAB, no wheezes, rhonchi, or rales noted.  Respiratory effort nonlabored Abd: soft, mild distension, and pain in mid lower abdomen, +BS, no masses, hernias, or organomegaly, old surgical scars in RUQ and midline well healed MS: all 4 extremities are symmetrical with no cyanosis, clubbing, or edema. Skin: warm and dry with no masses, lesions, or rashes Psych: A&Ox3 with an appropriate affect.   Results for orders placed or performed during the hospital encounter of 02/07/15 (from the past 48 hour(s))  Lipase, blood     Status: None   Collection Time:  02/07/15  6:14 PM  Result Value Ref Range   Lipase 24 11 - 51 U/L  Comprehensive metabolic panel     Status: Abnormal   Collection Time: 02/07/15  6:14 PM  Result Value Ref Range   Sodium 141 135 - 145 mmol/L   Potassium 3.8 3.5 - 5.1 mmol/L   Chloride 105 101 - 111 mmol/L   CO2 26 22 - 32 mmol/L   Glucose, Bld 132 (H) 65 - 99 mg/dL   BUN 12 6 - 20 mg/dL   Creatinine, Ser 1.19 0.61 - 1.24 mg/dL   Calcium 9.8 8.9 - 10.3 mg/dL   Total Protein 6.4 (L) 6.5 - 8.1 g/dL   Albumin 3.5 3.5 - 5.0 g/dL   AST 24 15 - 41 U/L   ALT 15 (L) 17 - 63 U/L   Alkaline Phosphatase 54 38 - 126 U/L   Total Bilirubin 1.6 (H) 0.3 - 1.2 mg/dL   GFR calc non Af Amer 57 (L) >60 mL/min   GFR calc Af Amer >60 >60 mL/min    Comment: (NOTE) The eGFR has been calculated using the CKD EPI equation. This calculation has not been validated in all clinical situations. eGFR's persistently <60 mL/min signify possible Chronic Kidney Disease.    Anion gap 10 5 - 15  CBC     Status: Abnormal   Collection Time: 02/07/15  6:14 PM  Result Value Ref Range   WBC 17.0 (H) 4.0 - 10.5 K/uL   RBC 4.67 4.22 - 5.81 MIL/uL   Hemoglobin 14.6 13.0 - 17.0 g/dL   HCT 42.7 39.0 - 52.0 %   MCV 91.4 78.0 - 100.0 fL   MCH 31.3 26.0 - 34.0 pg   MCHC 34.2 30.0 - 36.0 g/dL   RDW 14.1 11.5 - 15.5 %   Platelets 234 150 - 400 K/uL  Urinalysis, Routine w reflex microscopic (not at [facility])     Status: Abnormal   Collection Time: 02/07/15  9:20 PM  Result Value Ref Range   Color, Urine AMBER (A) YELLOW    Comment: BIOCHEMICALS MAY BE AFFECTED BY COLOR   APPearance CLOUDY (A) CLEAR   Specific Gravity, Urine 1.031 (H) 1.005 - 1.030   pH 5.0 5.0 - 8.0   Glucose, UA NEGATIVE NEGATIVE mg/dL   Hgb urine dipstick NEGATIVE NEGATIVE   Bilirubin Urine SMALL (A) NEGATIVE   Ketones, ur 15 (A) NEGATIVE mg/dL   Protein, ur 30 (A) NEGATIVE mg/dL   Nitrite NEGATIVE NEGATIVE   Leukocytes, UA NEGATIVE NEGATIVE  Urine microscopic-add on     Status:  Abnormal   Collection Time: 02/07/15  9:20 PM  Result Value Ref Range   Squamous Epithelial / LPF 0-5 (A) NONE SEEN   WBC, UA 0-5 0 - 5 WBC/hpf   RBC / HPF  NONE SEEN 0 - 5 RBC/hpf   Bacteria, UA FEW (A) NONE SEEN   Casts HYALINE CASTS (A) NEGATIVE   Urine-Other MUCOUS PRESENT    Ct Abdomen Pelvis W Contrast  02/08/2015  CLINICAL DATA:  Sudden onset of lower abdominal pain around 12:30. Vomiting. EXAM: CT ABDOMEN AND PELVIS WITH CONTRAST TECHNIQUE: Multidetector CT imaging of the abdomen and pelvis was performed using the standard protocol following bolus administration of intravenous contrast. CONTRAST:  149m OMNIPAQUE IOHEXOL 300 MG/ML  SOLN COMPARISON:  03/16/2014 FINDINGS: There is marked dilatation of small bowel with transition point in the distal ileum approximately 10 cm from the ileocolic anastomosis. The final 10 cm of ileum is thickened with luminal narrowing and mild adjacent inflammatory stranding opacities. This suggests flare up of the known Crohn's disease. No mass is evident. There are multiple intraluminal calcified calculi within the dilated small bowel and these are almost completely unchanged from the 12/09/2012 examination. This suggests either a blind pouch or marked stasis of bowel content. The colon is decompressed and unremarkable. There is prior cholecystectomy. The liver is unremarkable. There is mild dilatation of intrahepatic and extrahepatic bile ducts which may represent post cholecystectomy reservoir effect. There are unremarkable appearances of the pancreas, spleen and adrenals. There are nonobstructing 2-5 mm collecting system calculi bilaterally. There is no hydronephrosis or ureteral dilatation. The abdominal aorta is normal in caliber. There is mild atherosclerotic calcification. There is no adenopathy in the abdomen or pelvis. There is a small hiatal hernia. No significant musculoskeletal abnormality. IMPRESSION: 1. Small bowel obstruction. The transition point is  at the beginning of a 10 cm segment of narrowed and thickened distal ileum which may be associated with a Crohn's flare. 2. Multiple calcified calculi within the small bowel lumen, not significantly changed from previous studies dating back to 12/09/2012. 3. Bilateral nephrolithiasis. 4. Mild bile duct dilatation, possibly related to the prior cholecystectomy. 5. Hiatal hernia. Electronically Signed   By: DAndreas NewportM.D.   On: 02/08/2015 03:01      Assessment/Plan Crohns Disease Terminal ileitis with obstructive symptoms Leukocytosis - 17,000  Plan: -Still with obstructive symptoms this am.  NPO, NG tube, bowel rest, IVF, pain control, antiemetics, antibiotics (rocephin/flagyl) -Await improvement in bowel function prior to starting diet and removing NG -Medicine has started steroids -Would recommend calling GI -No need for urgent surgery hopefully this will continue to improve with antibiotics and steroids.  Surgery would be last line if no improvement -He's normally on Lomotil and mercaptopurine for crohns   MNat Christen PHampton [facility] CenterSurgery 02/08/2015, 10:21 AM Pager: [phone]

## 2015-02-08 NOTE — ED Notes (Signed)
Patient transported to CT 

## 2015-02-08 NOTE — Progress Notes (Signed)
Patient admitted after midnight, please see H&P. Surgery to see. Patient says he has some flatus but not much-- no nausea Bowel sounds high pitched  Eulogio Bear DO

## 2015-02-09 ENCOUNTER — Inpatient Hospital Stay (HOSPITAL_COMMUNITY): Payer: Medicare Other

## 2015-02-09 DIAGNOSIS — E876 Hypokalemia: Secondary | ICD-10-CM

## 2015-02-09 DIAGNOSIS — K50012 Crohn's disease of small intestine with intestinal obstruction: Principal | ICD-10-CM

## 2015-02-09 DIAGNOSIS — K5669 Other intestinal obstruction: Secondary | ICD-10-CM

## 2015-02-09 LAB — BASIC METABOLIC PANEL
ANION GAP: 8 (ref 5–15)
BUN: 17 mg/dL (ref 6–20)
CHLORIDE: 110 mmol/L (ref 101–111)
CO2: 27 mmol/L (ref 22–32)
Calcium: 8.3 mg/dL — ABNORMAL LOW (ref 8.9–10.3)
Creatinine, Ser: 1.22 mg/dL (ref 0.61–1.24)
GFR calc Af Amer: >60 mL/min (ref >60)
GFR, EST NON AFRICAN AMERICAN: 55 mL/min — AB (ref >60)
GLUCOSE: 110 mg/dL — AB (ref 65–99)
POTASSIUM: 3.3 mmol/L — AB (ref 3.5–5.1)
Sodium: 145 mmol/L (ref 135–145)

## 2015-02-09 LAB — CBC
HEMATOCRIT: 40.2 % (ref 39.0–52.0)
HEMOGLOBIN: 13.2 g/dL (ref 13.0–17.0)
MCH: 31 pg (ref 26.0–34.0)
MCHC: 32.8 g/dL (ref 30.0–36.0)
MCV: 94.4 fL (ref 78.0–100.0)
PLATELETS: 180 10*3/uL (ref 150–400)
RBC: 4.26 MIL/uL (ref 4.22–5.81)
RDW: 14.6 % (ref 11.5–15.5)
WBC: 6.4 10*3/uL (ref 4.0–10.5)

## 2015-02-09 LAB — GLUCOSE, CAPILLARY: Glucose-Capillary: 101 mg/dL — ABNORMAL HIGH (ref 65–99)

## 2015-02-09 MED ORDER — POTASSIUM CHLORIDE 20 MEQ/15ML (10%) PO SOLN
40.0000 meq | Freq: Once | ORAL | Status: AC
  Administered 2015-02-09: 40 meq via ORAL
  Filled 2015-02-09: qty 30

## 2015-02-09 MED ORDER — SODIUM CHLORIDE 0.9 % IV SOLN
INTRAVENOUS | Status: AC
  Administered 2015-02-10: via INTRAVENOUS

## 2015-02-09 NOTE — Progress Notes (Signed)
PROGRESS NOTE    Ricardo Villa OBS:[id number] DOB: Dec 25, 1934 DOA: 02/07/2015 PCP: [name], MD  HPI/Brief narrative 80 year old male with history of Crohn's disease, HLD, GERD, TIA, prior SBO, laparotomy, admitted to [facility] on 02/08/15 with complaints of sudden onset lower abdominal pain that started on 11/29, multiple episodes of nausea and vomiting. Workup in ED showed SBO on CT scan. Surgeons were consulted. Patient managed conservatively and is improving.   Assessment/Plan:  Small bowel obstruction - Likely due to terminal ileum inflammation - Gen. surgery was consulted. Patient was managed supportively with bowel rest/nothing by mouth, NG tube decompression, IV fluids, antiemetics, IV Solu-Medrol and antibiotics (Rocephin/Flagyl) - He has gradually improved >had couple of BMs this morning. NG tube being discontinued and advancing diet gradually.  Hypokalemia - Replace and follow  Hyperlipidemia - Continue fenofibrate  Crohn's disease - Currently on IV Solu-Medrol. Takes mercaptopurine at home.   DVT prophylaxis: Subcutaneous heparin Code Status: Full Family Communication: Discussed with spouse at bedside on 12/1. Disposition Plan: Possible discharge home on 12/2.   Consultants:  General surgery  Procedures:  G-tube-discontinued 12/1  Antibiotics:  IV Rocephin and Flagyl   Subjective: Feels better. Had 2 BMs this morning. Denies abdominal pain or distention. Denies any other complaints.  Objective: Filed Vitals:   02/09/15 0055 02/09/15 0527 02/09/15 1014 02/09/15 1434  BP: 140/62 136/65 141/69 142/69  Pulse: 93 86 87 85  Temp: 98.2 F (36.8 C) 97.8 F (36.6 C) 98 F (36.7 C) 99 F (37.2 C)  TempSrc: Oral Oral Oral Oral  Resp: 18 17 16 19   Height:      Weight:      SpO2: 93% 95% 94% 94%    Intake/Output Summary (Last 24 hours) at 02/09/15 1631 Last data filed at 02/09/15 0830  Gross per 24 hour  Intake      0 ml  Output    2400 ml  Net  -2400 ml   Filed Weights   02/07/15 1755 02/08/15 0546  Weight: 73.483 kg (162 lb) 76.613 kg (168 lb 14.4 oz)     Exam:  General exam: Pleasant elderly male lying comfortably supine in bed. Respiratory system: Clear. No increased work of breathing. Cardiovascular system: S1 & S2 heard, RRR. No JVD, murmurs, gallops, clicks or pedal edema. Gastrointestinal system: Abdomen is nondistended, soft and nontender. Extended midline laparotomy scar present. Normal bowel sounds heard. NG tube was still present this morning. Central nervous system: Alert and oriented. No focal neurological deficits. Extremities: Symmetric 5 x 5 power.   Data Reviewed: Basic Metabolic Panel:  Recent Labs Lab 02/07/15 1814 02/09/15 0845  NA 141 145  K 3.8 3.3*  CL 105 110  CO2 26 27  GLUCOSE 132* 110*  BUN 12 17  CREATININE 1.19 1.22  CALCIUM 9.8 8.3*   Liver Function Tests:  Recent Labs Lab 02/07/15 1814  AST 24  ALT 15*  ALKPHOS 54  BILITOT 1.6*  PROT 6.4*  ALBUMIN 3.5    Recent Labs Lab 02/07/15 1814  LIPASE 24   No results for input(s): AMMONIA in the last 168 hours. CBC:  Recent Labs Lab 02/07/15 1814 02/09/15 0845  WBC 17.0* 6.4  HGB 14.6 13.2  HCT 42.7 40.2  MCV 91.4 94.4  PLT 234 180   Cardiac Enzymes: No results for input(s): CKTOTAL, CKMB, CKMBINDEX, TROPONINI in the last 168 hours. BNP (last 3 results) No results for input(s): PROBNP in the last 8760 hours. CBG:  Recent Labs Lab 02/09/15  0725  GLUCAP 101*    No results found for this or any previous visit (from the past 240 hour(s)).         Studies: Dg Abd 1 View  02/08/2015  CLINICAL DATA:  Evaluate for nasogastric tube placement. EXAM: INTRO LONG GI TUBE; ABDOMEN - 1 VIEW CONTRAST:  None FLUOROSCOPY TIME:  Fluoroscopy Time (in minutes and seconds): 4 minutes and 0 seconds Number of Acquired Images:  3 COMPARISON:  None. FINDINGS: Three fluoroscopic spot images are provided showing  the nasogastric tube to be well positioned within the stomach with tip in the expected region of the gastric pylorus/duodenal bulb. IMPRESSION: Nasogastric tube appears well positioned with tip in the stomach pylorus/duodenal bulb region. 4 minutes fluoroscopy time provided for the procedure. Electronically Signed   By: Franki Cabot M.D.   On: 02/08/2015 18:27   Ct Abdomen Pelvis W Contrast  02/08/2015  CLINICAL DATA:  Sudden onset of lower abdominal pain around 12:30. Vomiting. EXAM: CT ABDOMEN AND PELVIS WITH CONTRAST TECHNIQUE: Multidetector CT imaging of the abdomen and pelvis was performed using the standard protocol following bolus administration of intravenous contrast. CONTRAST:  130m OMNIPAQUE IOHEXOL 300 MG/ML  SOLN COMPARISON:  03/16/2014 FINDINGS: There is marked dilatation of small bowel with transition point in the distal ileum approximately 10 cm from the ileocolic anastomosis. The final 10 cm of ileum is thickened with luminal narrowing and mild adjacent inflammatory stranding opacities. This suggests flare up of the known Crohn's disease. No mass is evident. There are multiple intraluminal calcified calculi within the dilated small bowel and these are almost completely unchanged from the 12/09/2012 examination. This suggests either a blind pouch or marked stasis of bowel content. The colon is decompressed and unremarkable. There is prior cholecystectomy. The liver is unremarkable. There is mild dilatation of intrahepatic and extrahepatic bile ducts which may represent post cholecystectomy reservoir effect. There are unremarkable appearances of the pancreas, spleen and adrenals. There are nonobstructing 2-5 mm collecting system calculi bilaterally. There is no hydronephrosis or ureteral dilatation. The abdominal aorta is normal in caliber. There is mild atherosclerotic calcification. There is no adenopathy in the abdomen or pelvis. There is a small hiatal hernia. No significant musculoskeletal  abnormality. IMPRESSION: 1. Small bowel obstruction. The transition point is at the beginning of a 10 cm segment of narrowed and thickened distal ileum which may be associated with a Crohn's flare. 2. Multiple calcified calculi within the small bowel lumen, not significantly changed from previous studies dating back to 12/09/2012. 3. Bilateral nephrolithiasis. 4. Mild bile duct dilatation, possibly related to the prior cholecystectomy. 5. Hiatal hernia. Electronically Signed   By: DAndreas NewportM.D.   On: 02/08/2015 03:01   Dg Abd Portable 1v  02/09/2015  CLINICAL DATA:  Patient with history of small bowel obstruction. EXAM: PORTABLE ABDOMEN - 1 VIEW COMPARISON:  Abdominal radiograph 02/08/2015. FINDINGS: NG tube tip and side-port project over descending duodenum. Right upper quadrant abdominal surgical clips. Re- demonstrated multiple calcifications within mid abdomen. Gas is demonstrated within the colon. Postsurgical changes right hemi abdomen. No definite gaseous distended loops of small bowel identified. IMPRESSION: No definite gaseous distended loops of small bowel identified to suggest obstruction. Stool is present within the colon. Electronically Signed   By: DLovey NewcomerM.D.   On: 02/09/2015 11:20   Dg Abd Portable 1v  02/08/2015  CLINICAL DATA:  Nasogastric tube placement. Small bowel obstruction. EXAM: PORTABLE ABDOMEN - 1 VIEW COMPARISON:  03/20/2014 FINDINGS: Several mildly dilated  small bowel loops are seen, which are predominantly fluid filled. There is also gas and some oral contrast material seen within the colon. Surgical clips and staples seen in the right abdomen. No evidence free air. No nasogastric tube seen within the visualized portion of lower chest or abdomen on this study. IMPRESSION: Nonvisualization of nasogastric tube on this exam. Partial small bowel obstruction. Electronically Signed   By: Earle Gell M.D.   On: 02/08/2015 15:40   Dg Intro Long Gi Tube  02/08/2015   CLINICAL DATA:  Evaluate for nasogastric tube placement. EXAM: INTRO LONG GI TUBE; ABDOMEN - 1 VIEW CONTRAST:  None FLUOROSCOPY TIME:  Fluoroscopy Time (in minutes and seconds): 4 minutes and 0 seconds Number of Acquired Images:  3 COMPARISON:  None. FINDINGS: Three fluoroscopic spot images are provided showing the nasogastric tube to be well positioned within the stomach with tip in the expected region of the gastric pylorus/duodenal bulb. IMPRESSION: Nasogastric tube appears well positioned with tip in the stomach pylorus/duodenal bulb region. 4 minutes fluoroscopy time provided for the procedure. Electronically Signed   By: Franki Cabot M.D.   On: 02/08/2015 18:27        Scheduled Meds: . aspirin  81 mg Oral Daily  . cefTRIAXone (ROCEPHIN)  IV  2 g Intravenous Q24H  . heparin  5,000 Units Subcutaneous 3 times per day  . methylPREDNISolone (SOLU-MEDROL) injection  60 mg Intravenous Daily  . metronidazole  500 mg Intravenous Q6H   Continuous Infusions: . sodium chloride 125 mL/hr at 02/08/15 2220    Principal Problem:   Crohn's disease of small intestine with intestinal obstruction (HCC) Active Problems:   SBO (small bowel obstruction) ([place])    Time spent: 25 minutes.    Vernell Leep, MD, FACP, FHM. [facility] Pager [phone]  If 7PM-7AM, please contact night-coverage [email or web address] Password [email or web address] 02/09/2015, 4:31 PM    LOS: 1 day

## 2015-02-09 NOTE — Progress Notes (Signed)
NG tube d/c'd as ordered

## 2015-02-09 NOTE — Progress Notes (Signed)
Patient ID: Ricardo Villa, male   DOB: [birth year], 80 y.o.   MRN: [id number]     [facility]      [address] [place], [place] [address]    Phone: [phone] FAX: [phone]     Subjective: Passing flatus and having BMS. WBC normalized and afebrile. No pain.    Objective:  Vital signs:  Filed Vitals:   02/08/15 1831 02/08/15 2205 02/09/15 0055 02/09/15 0527  BP: 138/65 136/66 140/62 136/65  Pulse: 99 101 93 86  Temp: 98.9 F (37.2 C) 98.7 F (37.1 C) 98.2 F (36.8 C) 97.8 F (36.6 C)  TempSrc: Oral Oral Oral Oral  Resp: _0 Height:      Weight:      SpO2: 93% 93% 93% 95%    Last BM Date: 02/07/15  Intake/Output   Yesterday:  11/30 0701 - 12/01 0700 In: -  Out: 8144 [Emesis/NG output:350] This shift:  Total I/O In: -  Out: 1150 [Emesis/NG output:1150]   Physical Exam: General: Pt awake/alert/oriented x4 in no acute distress  Abdomen: Soft.  Nondistended.  Non tender.  No evidence of peritonitis.  No incarcerated hernias.    Problem List:   Principal Problem:   Crohn's disease of small intestine with intestinal obstruction ([place]) Active Problems:   SBO (small bowel obstruction) ([place])    Results:   Labs: Results for orders placed or performed during the hospital encounter of 02/07/15 (from the past 48 hour(s))  Lipase, blood     Status: None   Collection Time: 02/07/15  6:14 PM  Result Value Ref Range   Lipase 24 11 - 51 U/L  Comprehensive metabolic panel     Status: Abnormal   Collection Time: 02/07/15  6:14 PM  Result Value Ref Range   Sodium 141 135 - 145 mmol/L   Potassium 3.8 3.5 - 5.1 mmol/L   Chloride 105 101 - 111 mmol/L   CO2 26 22 - 32 mmol/L   Glucose, Bld 132 (H) 65 - 99 mg/dL   BUN 12 6 - 20 mg/dL   Creatinine, Ser 1.19 0.61 - 1.24 mg/dL   Calcium 9.8 8.9 - 10.3 mg/dL   Total Protein 6.4 (L) 6.5 - 8.1 g/dL   Albumin 3.5 3.5 - 5.0 g/dL   AST 24 15 - 41 U/L    ALT 15 (L) 17 - 63 U/L   Alkaline Phosphatase 54 38 - 126 U/L   Total Bilirubin 1.6 (H) 0.3 - 1.2 mg/dL   GFR calc non Af Amer 57 (L) >60 mL/min   GFR calc Af Amer >60 >60 mL/min    Comment: (NOTE) The eGFR has been calculated using the CKD EPI equation. This calculation has not been validated in all clinical situations. eGFR's persistently <60 mL/min signify possible Chronic Kidney Disease.    Anion gap 10 5 - 15  CBC     Status: Abnormal   Collection Time: 02/07/15  6:14 PM  Result Value Ref Range   WBC 17.0 (H) 4.0 - 10.5 K/uL   RBC 4.67 4.22 - 5.81 MIL/uL   Hemoglobin 14.6 13.0 - 17.0 g/dL   HCT 42.7 39.0 - 52.0 %   MCV 91.4 78.0 - 100.0 fL   MCH 31.3 26.0 - 34.0 pg   MCHC 34.2 30.0 - 36.0 g/dL   RDW 14.1 11.5 - 15.5 %   Platelets 234 150 - 400 K/uL  Urinalysis, Routine w reflex microscopic (  not at [facility])     Status: Abnormal   Collection Time: 02/07/15  9:20 PM  Result Value Ref Range   Color, Urine AMBER (A) YELLOW    Comment: BIOCHEMICALS MAY BE AFFECTED BY COLOR   APPearance CLOUDY (A) CLEAR   Specific Gravity, Urine 1.031 (H) 1.005 - 1.030   pH 5.0 5.0 - 8.0   Glucose, UA NEGATIVE NEGATIVE mg/dL   Hgb urine dipstick NEGATIVE NEGATIVE   Bilirubin Urine SMALL (A) NEGATIVE   Ketones, ur 15 (A) NEGATIVE mg/dL   Protein, ur 30 (A) NEGATIVE mg/dL   Nitrite NEGATIVE NEGATIVE   Leukocytes, UA NEGATIVE NEGATIVE  Urine microscopic-add on     Status: Abnormal   Collection Time: 02/07/15  9:20 PM  Result Value Ref Range   Squamous Epithelial / LPF 0-5 (A) NONE SEEN   WBC, UA 0-5 0 - 5 WBC/hpf   RBC / HPF NONE SEEN 0 - 5 RBC/hpf   Bacteria, UA FEW (A) NONE SEEN   Casts HYALINE CASTS (A) NEGATIVE   Urine-Other MUCOUS PRESENT   Glucose, capillary     Status: Abnormal   Collection Time: 02/09/15  7:25 AM  Result Value Ref Range   Glucose-Capillary 101 (H) 65 - 99 mg/dL   Comment 1 Notify RN    Comment 2 Document in Chart   CBC     Status: None   Collection Time:  02/09/15  8:45 AM  Result Value Ref Range   WBC 6.4 4.0 - 10.5 K/uL   RBC 4.26 4.22 - 5.81 MIL/uL   Hemoglobin 13.2 13.0 - 17.0 g/dL   HCT 40.2 39.0 - 52.0 %   MCV 94.4 78.0 - 100.0 fL   MCH 31.0 26.0 - 34.0 pg   MCHC 32.8 30.0 - 36.0 g/dL   RDW 14.6 11.5 - 15.5 %   Platelets 180 150 - 400 K/uL  Basic metabolic panel     Status: Abnormal   Collection Time: 02/09/15  8:45 AM  Result Value Ref Range   Sodium 145 135 - 145 mmol/L   Potassium 3.3 (L) 3.5 - 5.1 mmol/L   Chloride 110 101 - 111 mmol/L   CO2 27 22 - 32 mmol/L   Glucose, Bld 110 (H) 65 - 99 mg/dL   BUN 17 6 - 20 mg/dL   Creatinine, Ser 1.22 0.61 - 1.24 mg/dL   Calcium 8.3 (L) 8.9 - 10.3 mg/dL   GFR calc non Af Amer 55 (L) >60 mL/min   GFR calc Af Amer >60 >60 mL/min    Comment: (NOTE) The eGFR has been calculated using the CKD EPI equation. This calculation has not been validated in all clinical situations. eGFR's persistently <60 mL/min signify possible Chronic Kidney Disease.    Anion gap 8 5 - 15    Imaging / Studies: Dg Abd 1 View  02/08/2015  CLINICAL DATA:  Evaluate for nasogastric tube placement. EXAM: INTRO LONG GI TUBE; ABDOMEN - 1 VIEW CONTRAST:  None FLUOROSCOPY TIME:  Fluoroscopy Time (in minutes and seconds): 4 minutes and 0 seconds Number of Acquired Images:  3 COMPARISON:  None. FINDINGS: Three fluoroscopic spot images are provided showing the nasogastric tube to be well positioned within the stomach with tip in the expected region of the gastric pylorus/duodenal bulb. IMPRESSION: Nasogastric tube appears well positioned with tip in the stomach pylorus/duodenal bulb region. 4 minutes fluoroscopy time provided for the procedure. Electronically Signed   By: Franki Cabot M.D.   On: 02/08/2015 18:27  Ct Abdomen Pelvis W Contrast  02/08/2015  CLINICAL DATA:  Sudden onset of lower abdominal pain around 12:30. Vomiting. EXAM: CT ABDOMEN AND PELVIS WITH CONTRAST TECHNIQUE: Multidetector CT imaging of the  abdomen and pelvis was performed using the standard protocol following bolus administration of intravenous contrast. CONTRAST:  165m OMNIPAQUE IOHEXOL 300 MG/ML  SOLN COMPARISON:  03/16/2014 FINDINGS: There is marked dilatation of small bowel with transition point in the distal ileum approximately 10 cm from the ileocolic anastomosis. The final 10 cm of ileum is thickened with luminal narrowing and mild adjacent inflammatory stranding opacities. This suggests flare up of the known Crohn's disease. No mass is evident. There are multiple intraluminal calcified calculi within the dilated small bowel and these are almost completely unchanged from the 12/09/2012 examination. This suggests either a blind pouch or marked stasis of bowel content. The colon is decompressed and unremarkable. There is prior cholecystectomy. The liver is unremarkable. There is mild dilatation of intrahepatic and extrahepatic bile ducts which may represent post cholecystectomy reservoir effect. There are unremarkable appearances of the pancreas, spleen and adrenals. There are nonobstructing 2-5 mm collecting system calculi bilaterally. There is no hydronephrosis or ureteral dilatation. The abdominal aorta is normal in caliber. There is mild atherosclerotic calcification. There is no adenopathy in the abdomen or pelvis. There is a small hiatal hernia. No significant musculoskeletal abnormality. IMPRESSION: 1. Small bowel obstruction. The transition point is at the beginning of a 10 cm segment of narrowed and thickened distal ileum which may be associated with a Crohn's flare. 2. Multiple calcified calculi within the small bowel lumen, not significantly changed from previous studies dating back to 12/09/2012. 3. Bilateral nephrolithiasis. 4. Mild bile duct dilatation, possibly related to the prior cholecystectomy. 5. Hiatal hernia. Electronically Signed   By: DAndreas NewportM.D.   On: 02/08/2015 03:01   Dg Abd Portable 1v  02/08/2015   CLINICAL DATA:  Nasogastric tube placement. Small bowel obstruction. EXAM: PORTABLE ABDOMEN - 1 VIEW COMPARISON:  03/20/2014 FINDINGS: Several mildly dilated small bowel loops are seen, which are predominantly fluid filled. There is also gas and some oral contrast material seen within the colon. Surgical clips and staples seen in the right abdomen. No evidence free air. No nasogastric tube seen within the visualized portion of lower chest or abdomen on this study. IMPRESSION: Nonvisualization of nasogastric tube on this exam. Partial small bowel obstruction. Electronically Signed   By: JEarle GellM.D.   On: 02/08/2015 15:40   Dg Intro Long Gi Tube  02/08/2015  CLINICAL DATA:  Evaluate for nasogastric tube placement. EXAM: INTRO LONG GI TUBE; ABDOMEN - 1 VIEW CONTRAST:  None FLUOROSCOPY TIME:  Fluoroscopy Time (in minutes and seconds): 4 minutes and 0 seconds Number of Acquired Images:  3 COMPARISON:  None. FINDINGS: Three fluoroscopic spot images are provided showing the nasogastric tube to be well positioned within the stomach with tip in the expected region of the gastric pylorus/duodenal bulb. IMPRESSION: Nasogastric tube appears well positioned with tip in the stomach pylorus/duodenal bulb region. 4 minutes fluoroscopy time provided for the procedure. Electronically Signed   By: SFranki CabotM.D.   On: 02/08/2015 18:27    Medications / Allergies:  Scheduled Meds: . aspirin  81 mg Oral Daily  . cefTRIAXone (ROCEPHIN)  IV  2 g Intravenous Q24H  . heparin  5,000 Units Subcutaneous 3 times per day  . methylPREDNISolone (SOLU-MEDROL) injection  60 mg Intravenous Daily  . metronidazole  500 mg Intravenous Q6H  Continuous Infusions: . sodium chloride 125 mL/hr at 02/08/15 2220   PRN Meds:.morphine injection, ondansetron (ZOFRAN) IV  Antibiotics: Anti-infectives    Start     Dose/Rate Route Frequency Ordered Stop   02/08/15 1200  cefTRIAXone (ROCEPHIN) 2 g in dextrose 5 % 50 mL IVPB     Comments:  Pharmacy may adjust dosing strength / duration / interval for maximal efficacy   2 g 100 mL/hr over 30 Minutes Intravenous Every 24 hours 02/08/15 1053     02/08/15 1200  metroNIDAZOLE (FLAGYL) IVPB 500 mg     500 mg 100 mL/hr over 60 Minutes Intravenous Every 6 hours 02/08/15 1053          Assessment/Plan Crohn's disease Terminal ileitis, SBO v ileus -clamp NGT and allow for clears  Erby Pian, [facility] Pager [phone](7A-4:30P) For consults and floor pages call [phone](7A-4:30P)  02/09/2015 10:11 AM

## 2015-02-10 LAB — BASIC METABOLIC PANEL
ANION GAP: 7 (ref 5–15)
Anion gap: 8 (ref 5–15)
BUN: 14 mg/dL (ref 6–20)
BUN: 11 mg/dL (ref 6–20)
CALCIUM: 8 mg/dL — AB (ref 8.9–10.3)
CALCIUM: 8.2 mg/dL — AB (ref 8.9–10.3)
CO2: 25 mmol/L (ref 22–32)
CO2: 27 mmol/L (ref 22–32)
CREATININE: 1.08 mg/dL (ref 0.61–1.24)
Chloride: 112 mmol/L — ABNORMAL HIGH (ref 101–111)
Chloride: 112 mmol/L — ABNORMAL HIGH (ref 101–111)
Creatinine, Ser: 1.11 mg/dL (ref 0.61–1.24)
GFR calc Af Amer: >60 mL/min (ref >60)
GFR calc Af Amer: >60 mL/min (ref >60)
GFR calc non Af Amer: >60 mL/min (ref >60)
GFR calc non Af Amer: >60 mL/min (ref >60)
GLUCOSE: 84 mg/dL (ref 65–99)
GLUCOSE: 114 mg/dL — AB (ref 65–99)
Potassium: 2.6 mmol/L — CL (ref 3.5–5.1)
Potassium: 3.1 mmol/L — ABNORMAL LOW (ref 3.5–5.1)
Sodium: 145 mmol/L (ref 135–145)
Sodium: 146 mmol/L — ABNORMAL HIGH (ref 135–145)

## 2015-02-10 LAB — MAGNESIUM: Magnesium: 2 mg/dL (ref 1.7–2.4)

## 2015-02-10 MED ORDER — POTASSIUM CHLORIDE 20 MEQ/15ML (10%) PO SOLN
40.0000 meq | Freq: Once | ORAL | Status: AC
  Administered 2015-02-10: 40 meq via ORAL
  Filled 2015-02-10: qty 30

## 2015-02-10 MED ORDER — PREDNISONE 10 MG PO TABS: 40.0000 mg | ORAL_TABLET | Freq: Every day | ORAL | Status: DC

## 2015-02-10 NOTE — Progress Notes (Signed)
Patient is discharged from room 5C20 at this time. Alert and in stable condition. IV site d/c'd. Instructions read to patient and understanding verbalized. Left unit via wheelchair with wife and all belongings at side.

## 2015-02-10 NOTE — Progress Notes (Signed)
Subjective: Doing well with no abd pain. Tol CLD.  + BM   Objective: Vital signs in last 24 hours: Temp:  [97.4 F (36.3 C)-99 F (37.2 C)] 97.7 F (36.5 C) (12/02 0604) Pulse Rate:  [66-87] 72 (12/02 0604) Resp:  [16-19] 18 (12/02 0604) BP: (134-145)/(69-79) 141/79 mmHg (12/02 0604) SpO2:  [93 %-97 %] 95 % (12/02 0604) Last BM Date: 02/09/15  Intake/Output from previous day: 12/01 0701 - 12/02 0700 In: -  Out: 1150 [Emesis/NG output:1150] Intake/Output this shift:    General appearance: alert and cooperative GI: soft, non-tender; bowel sounds normal; no masses,  no organomegaly  Lab Results:   Recent Labs  02/07/15 1814 02/09/15 0845  WBC 17.0* 6.4  HGB 14.6 13.2  HCT 42.7 40.2  PLT 234 180   BMET  Recent Labs  02/07/15 1814 02/09/15 0845  NA 141 145  K 3.8 3.3*  CL 105 110  CO2 26 27  GLUCOSE 132* 110*  BUN 12 17  CREATININE 1.19 1.22  CALCIUM 9.8 8.3*   PT/INR No results for input(s): LABPROT, INR in the last 72 hours. ABG No results for input(s): PHART, HCO3 in the last 72 hours.  Invalid input(s): PCO2, PO2  Studies/Results: Dg Abd 1 View  02/08/2015  CLINICAL DATA:  Evaluate for nasogastric tube placement. EXAM: INTRO LONG GI TUBE; ABDOMEN - 1 VIEW CONTRAST:  None FLUOROSCOPY TIME:  Fluoroscopy Time (in minutes and seconds): 4 minutes and 0 seconds Number of Acquired Images:  3 COMPARISON:  None. FINDINGS: Three fluoroscopic spot images are provided showing the nasogastric tube to be well positioned within the stomach with tip in the expected region of the gastric pylorus/duodenal bulb. IMPRESSION: Nasogastric tube appears well positioned with tip in the stomach pylorus/duodenal bulb region. 4 minutes fluoroscopy time provided for the procedure. Electronically Signed   By: Franki Cabot M.D.   On: 02/08/2015 18:27   Dg Abd Portable 1v  02/09/2015  CLINICAL DATA:  Patient with history of small bowel obstruction. EXAM: PORTABLE ABDOMEN - 1 VIEW  COMPARISON:  Abdominal radiograph 02/08/2015. FINDINGS: NG tube tip and side-port project over descending duodenum. Right upper quadrant abdominal surgical clips. Re- demonstrated multiple calcifications within mid abdomen. Gas is demonstrated within the colon. Postsurgical changes right hemi abdomen. No definite gaseous distended loops of small bowel identified. IMPRESSION: No definite gaseous distended loops of small bowel identified to suggest obstruction. Stool is present within the colon. Electronically Signed   By: Lovey Newcomer M.D.   On: 02/09/2015 11:20   Dg Abd Portable 1v  02/08/2015  CLINICAL DATA:  Nasogastric tube placement. Small bowel obstruction. EXAM: PORTABLE ABDOMEN - 1 VIEW COMPARISON:  03/20/2014 FINDINGS: Several mildly dilated small bowel loops are seen, which are predominantly fluid filled. There is also gas and some oral contrast material seen within the colon. Surgical clips and staples seen in the right abdomen. No evidence free air. No nasogastric tube seen within the visualized portion of lower chest or abdomen on this study. IMPRESSION: Nonvisualization of nasogastric tube on this exam. Partial small bowel obstruction. Electronically Signed   By: Earle Gell M.D.   On: 02/08/2015 15:40   Dg Intro Long Gi Tube  02/08/2015  CLINICAL DATA:  Evaluate for nasogastric tube placement. EXAM: INTRO LONG GI TUBE; ABDOMEN - 1 VIEW CONTRAST:  None FLUOROSCOPY TIME:  Fluoroscopy Time (in minutes and seconds): 4 minutes and 0 seconds Number of Acquired Images:  3 COMPARISON:  None. FINDINGS: Three fluoroscopic spot images  are provided showing the nasogastric tube to be well positioned within the stomach with tip in the expected region of the gastric pylorus/duodenal bulb. IMPRESSION: Nasogastric tube appears well positioned with tip in the stomach pylorus/duodenal bulb region. 4 minutes fluoroscopy time provided for the procedure. Electronically Signed   By: Franki Cabot M.D.   On: 02/08/2015  18:27    Anti-infectives: Anti-infectives    Start     Dose/Rate Route Frequency Ordered Stop   02/08/15 1200  cefTRIAXone (ROCEPHIN) 2 g in dextrose 5 % 50 mL IVPB    Comments:  Pharmacy may adjust dosing strength / duration / interval for maximal efficacy   2 g 100 mL/hr over 30 Minutes Intravenous Every 24 hours 02/08/15 1053     02/08/15 1200  metroNIDAZOLE (FLAGYL) IVPB 500 mg     500 mg 100 mL/hr over 60 Minutes Intravenous Every 6 hours 02/08/15 1053        Assessment/Plan: Crohns pSBO  OK to adv diet as tol Ok for DC when OK per Medicine    LOS: 2 days    Rosario Jacks., [facility] 02/10/2015

## 2015-02-10 NOTE — Discharge Summary (Signed)
Physician Discharge Summary  Ricardo Villa:[id number] DOB: 08/14/1934 DOA: 02/07/2015  PCP: [name], MD  Admit date: 02/07/2015 Discharge date: 02/10/2015  Time spent: Less than 30 minutes  Recommendations for Outpatient Follow-up:  1. Dr. Leighton Ruff, PCP in 3 days with repeat labs, (CBC & CMP) 2. Dr.  Cellar, [facility] in 1 week.  Discharge Diagnoses:  Principal Problem:   Crohn's disease of small intestine with intestinal obstruction (Kinde) Active Problems:   SBO (small bowel obstruction) (Ruth)   Discharge Condition: Improved & Stable  Diet recommendation: Soft diet.  Filed Weights   02/07/15 1755 02/08/15 0546  Weight: 73.483 kg (162 lb) 76.613 kg (168 lb 14.4 oz)    History of present illness:  80 year old male with history of Crohn's disease, HLD, GERD, TIA, prior SBO, laparotomy, admitted to [facility] on 02/08/15 with complaints of sudden onset lower abdominal pain that started on 11/29, multiple episodes of nausea and vomiting. Workup in ED showed SBO on CT scan. Surgeons were consulted. Patient managed conservatively and improved.  Hospital Course:   Small bowel obstruction - Likely due to terminal ileum inflammation - Gen. surgery was consulted. Patient was managed supportively with bowel rest/nothing by mouth, NG tube decompression, IV fluids, antiemetics, IV Solu-Medrol and antibiotics (Rocephin/Flagyl) - He gradually improved. NG tube was discontinued yesterday. His diet was gradually advanced. He has had multiple BMs. He is tolerating soft diet. - Reviewed patient's admission CT imaging with [place] gastroenterologist on-call who recommended discharging patient on prednisone taper for possible inflammatory stricture and outpatient follow-up with GI. He did not recommend antibiotics.  Hypokalemia - Aggressively replaced today. Potassium this morning was 2.6 and after KDur 40 Meq, it came up to 3.1. Another dose of K Dur 40 Meq was  repeated prior to discharge. Follow-up outpatient with repeat BMP in a couple of days. Magnesium normal.  Hyperlipidemia - Continue fenofibrate  Crohn's disease - Takes mercaptopurine at home. Discharging on prednisone taper.   Discussed with spouse at bedside.   Consultants:  General surgery  Procedures:  G-tube-discontinued 12/1    Discharge Exam:  Complaints:  Continues to feel better. Had multiple BMs in the last 24 hours. Tolerating soft diet. Denies abdominal pain. No dizziness or lightheadedness. Ambulating in room.  Filed Vitals:   02/10/15 0137 02/10/15 0604 02/10/15 1014 02/10/15 1425  BP: 134/73 141/79 130/70 142/74  Pulse: 66 72 71 86  Temp: 97.5 F (36.4 C) 97.7 F (36.5 C) 98.4 F (36.9 C) 98.9 F (37.2 C)  TempSrc: Oral Oral Oral Oral  Resp: 17 18 16 17   Height:      Weight:      SpO2: 97% 95% 97% 95%    General exam: Pleasant elderly male lying comfortably supine in bed. Respiratory system: Clear. No increased work of breathing. Cardiovascular system: S1 & S2 heard, RRR. No JVD, murmurs, gallops, clicks or pedal edema. Gastrointestinal system: Abdomen is nondistended, soft and nontender. Extended midline laparotomy scar present. Normal bowel sounds heard.  Central nervous system: Alert and oriented. No focal neurological deficits. Extremities: Symmetric 5 x 5 power.  Discharge Instructions      Discharge Instructions    Call MD for:  extreme fatigue    Complete by:  As directed      Call MD for:  persistant dizziness or light-headedness    Complete by:  As directed      Call MD for:  persistant nausea and vomiting    Complete by:  As directed  Call MD for:  severe uncontrolled pain    Complete by:  As directed      Call MD for:  temperature >100.4    Complete by:  As directed      Discharge instructions    Complete by:  As directed   Soft diet.     Increase activity slowly    Complete by:  As directed             Medication  List    TAKE these medications        aspirin 81 MG tablet  Take 81 mg by mouth daily.     B-12 (Methylcobalamin) 1000 MCG Subl  Place 1 tablet under the tongue daily.     diphenoxylate-atropine 2.5-0.025 MG tablet  Commonly known as:  LOMOTIL  TAKE ONE TABLET 1 OR 2 TIMES DAILY AS NEEDED     fenofibrate 54 MG tablet  Take 54 mg by mouth daily.     mercaptopurine 50 MG tablet  Commonly known as:  PURINETHOL  TAKE ONE TABLET EACH DAY ON AN EMPTY STOMACH 1 HOUR BEFORE OR 2 HOURSAFTER MEALS CAUTION:CHEMOTHERAPY     predniSONE 10 MG tablet  Commonly known as:  DELTASONE  Take 4 tablets (40 mg total) by mouth daily with breakfast. Taper down by 10 mg every 3 days to discontinue.     PRESERVISION AREDS 2 PO  Take 1 capsule by mouth daily.       Follow-up Information    Follow up with [name], MD. Schedule an appointment as soon as possible for a visit in 3 days.   Specialty:  Family Medicine   Why:  To be seen with repeat labs (CBC & BMP).   Contact information:   [place] [address] [phone]       Follow up with Manus Gunning, MD. Schedule an appointment as soon as possible for a visit in 1 week.   Specialty:  Gastroenterology   Contact information:   [address] [place] [address] [phone]        The results of significant diagnostics from this hospitalization (including imaging, microbiology, ancillary and laboratory) are listed below for reference.    Significant Diagnostic Studies: Dg Abd 1 View  02/08/2015  CLINICAL DATA:  Evaluate for nasogastric tube placement. EXAM: INTRO LONG GI TUBE; ABDOMEN - 1 VIEW CONTRAST:  None FLUOROSCOPY TIME:  Fluoroscopy Time (in minutes and seconds): 4 minutes and 0 seconds Number of Acquired Images:  3 COMPARISON:  None. FINDINGS: Three fluoroscopic spot images are provided showing the nasogastric tube to be well positioned within the stomach with tip in the  expected region of the gastric pylorus/duodenal bulb. IMPRESSION: Nasogastric tube appears well positioned with tip in the stomach pylorus/duodenal bulb region. 4 minutes fluoroscopy time provided for the procedure. Electronically Signed   By: Franki Cabot M.D.   On: 02/08/2015 18:27   Ct Abdomen Pelvis W Contrast  02/08/2015  CLINICAL DATA:  Sudden onset of lower abdominal pain around 12:30. Vomiting. EXAM: CT ABDOMEN AND PELVIS WITH CONTRAST TECHNIQUE: Multidetector CT imaging of the abdomen and pelvis was performed using the standard protocol following bolus administration of intravenous contrast. CONTRAST:  175m OMNIPAQUE IOHEXOL 300 MG/ML  SOLN COMPARISON:  03/16/2014 FINDINGS: There is marked dilatation of small bowel with transition point in the distal ileum approximately 10 cm from the ileocolic anastomosis. The final 10 cm of ileum is thickened with luminal narrowing  and mild adjacent inflammatory stranding opacities. This suggests flare up of the known Crohn's disease. No mass is evident. There are multiple intraluminal calcified calculi within the dilated small bowel and these are almost completely unchanged from the 12/09/2012 examination. This suggests either a blind pouch or marked stasis of bowel content. The colon is decompressed and unremarkable. There is prior cholecystectomy. The liver is unremarkable. There is mild dilatation of intrahepatic and extrahepatic bile ducts which may represent post cholecystectomy reservoir effect. There are unremarkable appearances of the pancreas, spleen and adrenals. There are nonobstructing 2-5 mm collecting system calculi bilaterally. There is no hydronephrosis or ureteral dilatation. The abdominal aorta is normal in caliber. There is mild atherosclerotic calcification. There is no adenopathy in the abdomen or pelvis. There is a small hiatal hernia. No significant musculoskeletal abnormality. IMPRESSION: 1. Small bowel obstruction. The transition point is  at the beginning of a 10 cm segment of narrowed and thickened distal ileum which may be associated with a Crohn's flare. 2. Multiple calcified calculi within the small bowel lumen, not significantly changed from previous studies dating back to 12/09/2012. 3. Bilateral nephrolithiasis. 4. Mild bile duct dilatation, possibly related to the prior cholecystectomy. 5. Hiatal hernia. Electronically Signed   By: Andreas Newport M.D.   On: 02/08/2015 03:01   Dg Abd Portable 1v  02/09/2015  CLINICAL DATA:  Patient with history of small bowel obstruction. EXAM: PORTABLE ABDOMEN - 1 VIEW COMPARISON:  Abdominal radiograph 02/08/2015. FINDINGS: NG tube tip and side-port project over descending duodenum. Right upper quadrant abdominal surgical clips. Re- demonstrated multiple calcifications within mid abdomen. Gas is demonstrated within the colon. Postsurgical changes right hemi abdomen. No definite gaseous distended loops of small bowel identified. IMPRESSION: No definite gaseous distended loops of small bowel identified to suggest obstruction. Stool is present within the colon. Electronically Signed   By: Lovey Newcomer M.D.   On: 02/09/2015 11:20   Dg Abd Portable 1v  02/08/2015  CLINICAL DATA:  Nasogastric tube placement. Small bowel obstruction. EXAM: PORTABLE ABDOMEN - 1 VIEW COMPARISON:  03/20/2014 FINDINGS: Several mildly dilated small bowel loops are seen, which are predominantly fluid filled. There is also gas and some oral contrast material seen within the colon. Surgical clips and staples seen in the right abdomen. No evidence free air. No nasogastric tube seen within the visualized portion of lower chest or abdomen on this study. IMPRESSION: Nonvisualization of nasogastric tube on this exam. Partial small bowel obstruction. Electronically Signed   By: Earle Gell M.D.   On: 02/08/2015 15:40   Dg Intro Long Gi Tube  02/08/2015  CLINICAL DATA:  Evaluate for nasogastric tube placement. EXAM: INTRO LONG GI  TUBE; ABDOMEN - 1 VIEW CONTRAST:  None FLUOROSCOPY TIME:  Fluoroscopy Time (in minutes and seconds): 4 minutes and 0 seconds Number of Acquired Images:  3 COMPARISON:  None. FINDINGS: Three fluoroscopic spot images are provided showing the nasogastric tube to be well positioned within the stomach with tip in the expected region of the gastric pylorus/duodenal bulb. IMPRESSION: Nasogastric tube appears well positioned with tip in the stomach pylorus/duodenal bulb region. 4 minutes fluoroscopy time provided for the procedure. Electronically Signed   By: Franki Cabot M.D.   On: 02/08/2015 18:27    Microbiology: No results found for this or any previous visit (from the past 240 hour(s)).   Labs: Basic Metabolic Panel:  Recent Labs Lab 02/07/15 1814 02/09/15 0845 02/10/15 0536 02/10/15 1242  NA 141 145 145 146*  K  3.8 3.3* 2.6* 3.1*  CL 105 110 112* 112*  CO2 26 27 25 27   GLUCOSE 132* 110* 84 114*  BUN 12 17 14 11   CREATININE 1.19 1.22 1.08 1.11  CALCIUM 9.8 8.3* 8.0* 8.2*  MG  --   --   --  2.0   Liver Function Tests:  Recent Labs Lab 02/07/15 1814  AST 24  ALT 15*  ALKPHOS 54  BILITOT 1.6*  PROT 6.4*  ALBUMIN 3.5    Recent Labs Lab 02/07/15 1814  LIPASE 24   No results for input(s): AMMONIA in the last 168 hours. CBC:  Recent Labs Lab 02/07/15 1814 02/09/15 0845  WBC 17.0* 6.4  HGB 14.6 13.2  HCT 42.7 40.2  MCV 91.4 94.4  PLT 234 180   Cardiac Enzymes: No results for input(s): CKTOTAL, CKMB, CKMBINDEX, TROPONINI in the last 168 hours. BNP: BNP (last 3 results) No results for input(s): BNP in the last 8760 hours.  ProBNP (last 3 results) No results for input(s): PROBNP in the last 8760 hours.  CBG:  Recent Labs Lab 02/09/15 0725  GLUCAP 101*      Signed:  Vernell Leep, MD, FACP, FHM. [facility] Pager [phone]  If 7PM-7AM, please contact night-coverage [email or web address] Password [facility] 02/10/2015, 4:35 PM

## 2015-02-10 NOTE — Discharge Instructions (Signed)
Small Bowel Obstruction A small bowel obstruction is a blockage in the small bowel. The small bowel, which is also called the small intestine, is a long, slender tube that connects the stomach to the colon. When a person eats and drinks, food and fluids go from the stomach to the small bowel. This is where most of the nutrients in the food and fluids are absorbed. A small bowel obstruction will prevent food and fluids from passing through the small bowel as they normally do during digestion. The small bowel can become partially or completely blocked. This can cause symptoms such as abdominal pain, vomiting, and bloating. If this condition is not treated, it can be dangerous because the small bowel could rupture. CAUSES Common causes of this condition include:  Scar tissue from previous surgery or radiation treatment.  Recent surgery. This may cause the movements of the bowel to slow down and cause food to block the intestine.  Hernias.  Inflammatory bowel disease (colitis).  Twisting of the bowel (volvulus).  Tumors.  A foreign body.  Slipping of a part of the bowel into another part (intussusception). SYMPTOMS Symptoms of this condition include:  Abdominal pain. This may be dull cramps or sharp pain. It may occur in one area, or it may be present in the entire abdomen. Pain can range from mild to severe, depending on the degree of obstruction.  Nausea and vomiting. Vomit may be greenish or a yellow bile color.  Abdominal bloating.  Constipation.  Lack of passing gas.  Frequent belching.  Diarrhea. This may occur if the obstruction is partial and runny stool is able to leak around the obstruction. DIAGNOSIS This condition may be diagnosed based on a physical exam, medical history, and X-rays of the abdomen. You may also have other tests, such as a CT scan of the abdomen and pelvis. TREATMENT Treatment for this condition depends on the cause and severity of the problem.  Treatment options may include:  Bed rest along with fluids and pain medicines that are given through an IV tube inserted into one of your veins. Sometimes, this is all that is needed for the obstruction to improve.  Following a simple diet. In some cases, a clear liquid diet may be required for several days. This allows the bowel to rest.  Placement of a small tube (nasogastric tube) into the stomach. When the bowel is blocked, it usually swells up like a balloon that is filled with air and fluids. The air and fluids may be removed by suction through the nasogastric tube. This can help with pain, discomfort, and nausea. It can also help the obstruction to clear up faster.  Surgery. This may be required if other treatments do not work. Bowel obstruction from a hernia may require early surgery and can be an emergency procedure. Surgery may also be required for scar tissue that causes frequent or severe obstructions. HOME CARE INSTRUCTIONS  Get plenty of rest.  Follow instructions from your health care provider about eating restrictions. You may need to avoid solid foods and consume only clear liquids until your condition improves.  Take over-the-counter and prescription medicines only as told by your health care provider.  Keep all follow-up visits as told by your health care provider. This is important. SEEK MEDICAL CARE IF:  You have a fever.  You have chills. SEEK IMMEDIATE MEDICAL CARE IF:  You have increased pain or cramping.  You vomit blood.  You have uncontrolled vomiting or nausea.  You cannot drink  fluids because of vomiting or pain.  You develop confusion.  You begin feeling very dry or thirsty (dehydrated).  You have severe bloating.  You feel extremely weak or you faint.   This information is not intended to replace advice given to you by your health care provider. Make sure you discuss any questions you have with your health care provider.   Document Released:  05/14/2005 Document Revised: 11/16/2014 Document Reviewed: 04/21/2014 Elsevier Interactive Patient Education [employer].

## 2015-02-16 ENCOUNTER — Ambulatory Visit (INDEPENDENT_AMBULATORY_CARE_PROVIDER_SITE_OTHER): Payer: Medicare Other | Admitting: Physician Assistant

## 2015-02-16 ENCOUNTER — Encounter: Payer: Self-pay | Admitting: Physician Assistant

## 2015-02-16 VITALS — BP 122/64 | HR 74 | Ht 70.0 in | Wt 164.8 lb

## 2015-02-16 DIAGNOSIS — K50118 Crohn's disease of large intestine with other complication: Secondary | ICD-10-CM | POA: Diagnosis not present

## 2015-02-16 MED ORDER — OMEPRAZOLE 20 MG PO CPDR: 20.0000 mg | DELAYED_RELEASE_CAPSULE | Freq: Every day | ORAL | Status: DC

## 2015-02-16 MED ORDER — PREDNISONE 10 MG PO TABS: ORAL_TABLET | ORAL | Status: DC

## 2015-02-16 NOTE — Patient Instructions (Signed)
We have sent the following medications to your pharmacy for you to pick up at your convenience: Prednisone and Omeprazole   Continue taking the Purinethol.    Prednisone instuctions  3 tablets a day for the next 4 days  Take 2 tablets daily for 7 days  Take 1 tablet for 7 days  Take 1/2 tablet for 7 days   And then stop

## 2015-02-18 ENCOUNTER — Encounter: Payer: Self-pay | Admitting: Physician Assistant

## 2015-02-18 NOTE — Progress Notes (Signed)
Patient ID: Ricardo Villa, male   DOB: [birth year], 80 y.o.   MRN: [id number]     History of Present Illness: Ricardo Villa is a pleasant 80 year old male with a history of Crohn's disease of the terminal ileum. He had an ileocolic resection in 5035 and again in 1995. He had a small bowel obstruction in October 2014 which responded to bowel rest and steroids. He was again admitted with a small bowel obstruction in January 2016 when he developed severe dehydration and acute renal failure. He responded to bowel rest and intravenous hydration. He was previously followed by Dr. Olevia Perches. His last colonoscopy in December 2004 showed 2 adenomatous polyps. One of the polyps was a large sessile polyp in the right colon which was piecemeal removed but the base was left behind. There was no dysplasia. Recall colonoscopy was not determined due to his advanced age and general frail medical condition it was felt that at some point he may need another colonoscopy to try to remove the rest of the base of the polyp. Dr. Olevia Perches was not able to visualize the terminal ileum because the anastomosis was rather angulated. It was felt this was likely the site of his small bowel obstruction.  [name] was recently admitted 02/07/2015 through 02/10/2015 with a small bowel obstruction. He presented to [facility] on November 30 with complaints of sudden onset lower abdominal pain that started on November 29. He had had multiple episodes of nausea and vomiting. Workup in the emergency room showed a small bowel obstruction on CT scan. The surgeons were consulted. The patient was managed conservatively and improved. He was discharged home on his usual dose of mercaptopurine as well as on prednisone. He presents today for follow-up. He states he is feeling well with no nausea, vomiting, or abdominal pain. He is having a soft formed bowel movement once or twice a day.   Past Medical History  Diagnosis Date  .  Hyperhomocysteinemia ([place])   . Hyperlipidemia   . Small bowel obstruction ([place])   . History of adenomatous polyp of colon   . Vitamin B12 deficiency   . Crohn disease ([place])   . Prostate cancer ([place])   . GERD (gastroesophageal reflux disease)   . Diverticulosis   . Dehydration 03/01/2013    Diarrhea due to crohn's disease.   Marland Kitchen TIA (transient ischemic attack)     HX OF   . SBO (small bowel obstruction) ([place]) 03/17/2014    Past Surgical History  Procedure Laterality Date  . Cholecystectomy    . Terminal ileum resection    . Appendectomy    . Cataract extraction      both eyes   Family History  Problem Relation Age of Onset  . Crohn's disease Mother   . Heart disease Father   . Stroke Father   . Diabetes Paternal Grandfather   . Colon cancer Neg Hx   . Esophageal cancer Neg Hx   . Rectal cancer Neg Hx   . Stomach cancer Neg Hx    Social History  Substance Use Topics  . Smoking status: Former Research scientist (life sciences)  . Smokeless tobacco: Never Used  . Alcohol Use: Yes     Comment: occasionally   Current Outpatient Prescriptions  Medication Sig Dispense Refill  . aspirin 81 MG tablet Take 81 mg by mouth daily.    . B-12, Methylcobalamin, 1000 MCG SUBL Place 1 tablet under the tongue daily.    . diphenoxylate-atropine (LOMOTIL) 2.5-0.025 MG per tablet TAKE  ONE TABLET 1 OR 2 TIMES DAILY AS NEEDED (Patient taking differently: Take 1 tablet by mouth See admin instructions. TAKE ONE TABLET 1 OR 2 TIMES DAILY AS NEEDED) 60 tablet 2  . fenofibrate 54 MG tablet Take 54 mg by mouth daily.    . mercaptopurine (PURINETHOL) 50 MG tablet TAKE ONE TABLET EACH DAY ON AN EMPTY STOMACH 1 HOUR BEFORE OR 2 HOURSAFTER MEALS CAUTION:CHEMOTHERAPY 30 tablet 3  . Multiple Vitamins-Minerals (PRESERVISION AREDS 2 PO) Take 1 capsule by mouth daily.    . predniSONE (DELTASONE) 10 MG tablet Take 4 tablets (40 mg total) by mouth daily with breakfast. Taper down by 10 mg every 3 days to discontinue. 30 tablet 0  .  omeprazole (PRILOSEC) 20 MG capsule Take 1 capsule (20 mg total) by mouth daily. 30 capsule 0  . predniSONE (DELTASONE) 10 MG tablet 3 tablets a day for the next 4 days  Take 2 tablets daily for 7 days  Take 1 tablet for 7 days  Take 1/2 tablet for 7 days 100 tablet 0   No current facility-administered medications for this visit.   No Known Allergies   Review of Systems: Gen: Denies any fever, chills, sweats, anorexia, fatigue, weakness, malaise, weight loss, and sleep disorder CV: Denies chest pain, angina, palpitations, syncope, orthopnea, PND, peripheral edema, and claudication. Resp: Denies dyspnea at rest, dyspnea with exercise, cough, sputum, wheezing, coughing up blood, and pleurisy. GI: Denies vomiting blood, jaundice, and fecal incontinence.   Denies dysphagia or odynophagia. GU : Denies urinary burning, blood in urine, urinary frequency, urinary hesitancy, nocturnal urination, and urinary incontinence. MS: Denies joint pain, limitation of movement, and swelling, stiffness, low back pain, extremity pain. Denies muscle weakness, cramps, atrophy.  Derm: Denies rash, itching, dry skin, hives, moles, warts, or unhealing ulcers.  Psych: Denies depression, anxiety, memory loss, suicidal ideation, hallucinations, paranoia, and confusion. Heme: Denies bruising, bleeding, and enlarged lymph nodes. Neuro:  Denies any headaches, dizziness, paresthesia Endo:  Denies any problems with DM, thyroid, adrenal   Studies:   Dg Abd 1 View  02/08/2015  CLINICAL DATA:  Evaluate for nasogastric tube placement. EXAM: INTRO LONG GI TUBE; ABDOMEN - 1 VIEW CONTRAST:  None FLUOROSCOPY TIME:  Fluoroscopy Time (in minutes and seconds): 4 minutes and 0 seconds Number of Acquired Images:  3 COMPARISON:  None. FINDINGS: Three fluoroscopic spot images are provided showing the nasogastric tube to be well positioned within the stomach with tip in the expected region of the gastric pylorus/duodenal bulb.  IMPRESSION: Nasogastric tube appears well positioned with tip in the stomach pylorus/duodenal bulb region. 4 minutes fluoroscopy time provided for the procedure. Electronically Signed   By: Franki Cabot M.D.   On: 02/08/2015 18:27   Ct Abdomen Pelvis W Contrast  02/08/2015  CLINICAL DATA:  Sudden onset of lower abdominal pain around 12:30. Vomiting. EXAM: CT ABDOMEN AND PELVIS WITH CONTRAST TECHNIQUE: Multidetector CT imaging of the abdomen and pelvis was performed using the standard protocol following bolus administration of intravenous contrast. CONTRAST:  168m OMNIPAQUE IOHEXOL 300 MG/ML  SOLN COMPARISON:  03/16/2014 FINDINGS: There is marked dilatation of small bowel with transition point in the distal ileum approximately 10 cm from the ileocolic anastomosis. The final 10 cm of ileum is thickened with luminal narrowing and mild adjacent inflammatory stranding opacities. This suggests flare up of the known Crohn's disease. No mass is evident. There are multiple intraluminal calcified calculi within the dilated small bowel and these are almost completely unchanged from the  12/09/2012 examination. This suggests either a blind pouch or marked stasis of bowel content. The colon is decompressed and unremarkable. There is prior cholecystectomy. The liver is unremarkable. There is mild dilatation of intrahepatic and extrahepatic bile ducts which may represent post cholecystectomy reservoir effect. There are unremarkable appearances of the pancreas, spleen and adrenals. There are nonobstructing 2-5 mm collecting system calculi bilaterally. There is no hydronephrosis or ureteral dilatation. The abdominal aorta is normal in caliber. There is mild atherosclerotic calcification. There is no adenopathy in the abdomen or pelvis. There is a small hiatal hernia. No significant musculoskeletal abnormality. IMPRESSION: 1. Small bowel obstruction. The transition point is at the beginning of a 10 cm segment of narrowed and  thickened distal ileum which may be associated with a Crohn's flare. 2. Multiple calcified calculi within the small bowel lumen, not significantly changed from previous studies dating back to 12/09/2012. 3. Bilateral nephrolithiasis. 4. Mild bile duct dilatation, possibly related to the prior cholecystectomy. 5. Hiatal hernia. Electronically Signed   By: Andreas Newport M.D.   On: 02/08/2015 03:01   Dg Abd Portable 1v  02/09/2015  CLINICAL DATA:  Patient with history of small bowel obstruction. EXAM: PORTABLE ABDOMEN - 1 VIEW COMPARISON:  Abdominal radiograph 02/08/2015. FINDINGS: NG tube tip and side-port project over descending duodenum. Right upper quadrant abdominal surgical clips. Re- demonstrated multiple calcifications within mid abdomen. Gas is demonstrated within the colon. Postsurgical changes right hemi abdomen. No definite gaseous distended loops of small bowel identified. IMPRESSION: No definite gaseous distended loops of small bowel identified to suggest obstruction. Stool is present within the colon. Electronically Signed   By: Lovey Newcomer M.D.   On: 02/09/2015 11:20   Dg Abd Portable 1v  02/08/2015  CLINICAL DATA:  Nasogastric tube placement. Small bowel obstruction. EXAM: PORTABLE ABDOMEN - 1 VIEW COMPARISON:  03/20/2014 FINDINGS: Several mildly dilated small bowel loops are seen, which are predominantly fluid filled. There is also gas and some oral contrast material seen within the colon. Surgical clips and staples seen in the right abdomen. No evidence free air. No nasogastric tube seen within the visualized portion of lower chest or abdomen on this study. IMPRESSION: Nonvisualization of nasogastric tube on this exam. Partial small bowel obstruction. Electronically Signed   By: Earle Gell M.D.   On: 02/08/2015 15:40   Dg Intro Long Gi Tube  02/08/2015  CLINICAL DATA:  Evaluate for nasogastric tube placement. EXAM: INTRO LONG GI TUBE; ABDOMEN - 1 VIEW CONTRAST:  None FLUOROSCOPY TIME:   Fluoroscopy Time (in minutes and seconds): 4 minutes and 0 seconds Number of Acquired Images:  3 COMPARISON:  None. FINDINGS: Three fluoroscopic spot images are provided showing the nasogastric tube to be well positioned within the stomach with tip in the expected region of the gastric pylorus/duodenal bulb. IMPRESSION: Nasogastric tube appears well positioned with tip in the stomach pylorus/duodenal bulb region. 4 minutes fluoroscopy time provided for the procedure. Electronically Signed   By: Franki Cabot M.D.   On: 02/08/2015 18:27     Physical Exam: BP 122/64 mmHg  Pulse 74  Ht 5' 10"  (1.778 m)  Wt 164 lb 12.8 oz (74.753 kg)  BMI 23.65 kg/m2 General: Pleasant, well developed , Caucasian male in no acute distress Head: Normocephalic and atraumatic Eyes:  sclerae anicteric, conjunctiva pink  Ears: Normal auditory acuity Lungs: Clear throughout to auscultation Heart: Regular rate and rhythm Abdomen: Soft, non distended, non-tender. No masses, no hepatomegaly. Normal bowel sounds Musculoskeletal: Symmetrical with no  gross deformities  Extremities: No edema  Neurological: Alert oriented x 4, grossly nonfocal Psychological:  Alert and cooperative. Normal mood and affect  Assessment and Recommendations: 80 year old male with long-standing history of Crohn's disease at the ileocolic anastomosis. He has had periodic small bowel obstructions likely secondary to fibrosis at the surgical site. He recently was admitted and has been discharged on his usual dose of 6-MP. He is currently on 30 mg of prednisone he will continue prednisone 10 mg, take 3 tablets daily for the next 4 days, then take 2 tabs daily for 7 days, then take one tablet daily for 7 days, then take one half tablet daily for 7 days, then stop. He will continue his current dose of mercaptopurine. He will also use omeprazole 20 mg 1 by mouth every morning 30 minutes prior to breakfast. He will follow up in 6 weeks, sooner if needed. He  would like to establish care with Dr. Havery Moros. A considerable amount of time has been spent discussing possible initiation of biologic, but the patient is not interested. We have stressed surveillance colonoscopy, but the patient states he would prefer not to have anything done before the holidays and he will revisit this with Dr. Havery Moros at his follow-up appointment. We have also discussed possible surgical intervention and the patient is not interested. A CBC and B-12 levels will be obtained.        Najiyah Paris, Vita Barley PA-C 02/18/2015,

## 2015-02-20 ENCOUNTER — Telehealth: Payer: Self-pay | Admitting: *Deleted

## 2015-02-20 ENCOUNTER — Other Ambulatory Visit: Payer: Self-pay | Admitting: *Deleted

## 2015-02-20 DIAGNOSIS — K50119 Crohn's disease of large intestine with unspecified complications: Secondary | ICD-10-CM

## 2015-02-20 NOTE — Telephone Encounter (Signed)
Labs in EPIC. Left a message for patient to call for results.

## 2015-02-20 NOTE — Telephone Encounter (Signed)
Patient notified of recommendation and given instructions. He will come for labs.

## 2015-02-20 NOTE — Telephone Encounter (Signed)
-----   Message from [employer], PA-C sent at 02/20/2015  9:10 AM EST ----- Rollene Fare, I had pt  On a pred taper decreasing by 10 q week. Dr Havery Moros would like to decrease him by 5 mg q week. Could you let pt know and explaion how to do it--if you have qustions text me.

## 2015-02-20 NOTE — Progress Notes (Signed)
Agree with assessment and plan. I agree that colonoscopy is strongly recommended to assess for active inflammation vs. Fibrosis at the anastomosis and for CRC screening. I also agree with consideration for biologic therapy given his recent history. He has declined both at this time, I will reassess this issue with him. I would otherwise recommend a slower taper of prednisone to prevent rebound inflammation, recommend he taper prednisone by 42m per week until done. I will reassess him in clinic.

## 2015-02-20 NOTE — Telephone Encounter (Signed)
-----   Message from Vita Barley Hvozdovic, PA-C sent at 02/18/2015  8:18 PM EST ----- Rollene Fare, could to please have patient go for a CBC and vitamin B-12 level? Thank you

## 2015-02-21 ENCOUNTER — Other Ambulatory Visit: Payer: Self-pay | Admitting: *Deleted

## 2015-02-21 ENCOUNTER — Other Ambulatory Visit (INDEPENDENT_AMBULATORY_CARE_PROVIDER_SITE_OTHER): Payer: Medicare Other

## 2015-02-21 DIAGNOSIS — E538 Deficiency of other specified B group vitamins: Secondary | ICD-10-CM

## 2015-02-21 DIAGNOSIS — D72829 Elevated white blood cell count, unspecified: Secondary | ICD-10-CM

## 2015-02-21 DIAGNOSIS — K50119 Crohn's disease of large intestine with unspecified complications: Secondary | ICD-10-CM | POA: Diagnosis not present

## 2015-02-21 LAB — CBC WITH DIFFERENTIAL/PLATELET
BASOS PCT: 0.1 % (ref 0.0–3.0)
Basophils Absolute: 0 10*3/uL (ref 0.0–0.1)
EOS PCT: 0.1 % (ref 0.0–5.0)
Eosinophils Absolute: 0 10*3/uL (ref 0.0–0.7)
HEMATOCRIT: 39 % (ref 39.0–52.0)
HEMOGLOBIN: 12.9 g/dL — AB (ref 13.0–17.0)
LYMPHS PCT: 8.6 % — AB (ref 12.0–46.0)
Lymphs Abs: 1.3 10*3/uL (ref 0.7–4.0)
MCHC: 33 g/dL (ref 30.0–36.0)
MCV: 93.9 fl (ref 78.0–100.0)
Monocytes Absolute: 0.4 10*3/uL (ref 0.1–1.0)
Monocytes Relative: 2.4 % — ABNORMAL LOW (ref 3.0–12.0)
NEUTROS ABS: 13 10*3/uL — AB (ref 1.4–7.7)
Neutrophils Relative %: 88.8 % — ABNORMAL HIGH (ref 43.0–77.0)
Platelets: 264 10*3/uL (ref 150.0–400.0)
RBC: 4.16 Mil/uL — ABNORMAL LOW (ref 4.22–5.81)
RDW: 15.4 % (ref 11.5–15.5)
WBC: 14.7 10*3/uL — AB (ref 4.0–10.5)

## 2015-02-21 LAB — VITAMIN B12: Vitamin B-12: >1500 pg/mL — ABNORMAL HIGH (ref 211–911)

## 2015-02-27 ENCOUNTER — Other Ambulatory Visit (INDEPENDENT_AMBULATORY_CARE_PROVIDER_SITE_OTHER): Payer: Medicare Other

## 2015-02-27 ENCOUNTER — Encounter: Payer: Self-pay | Admitting: *Deleted

## 2015-02-27 DIAGNOSIS — D72829 Elevated white blood cell count, unspecified: Secondary | ICD-10-CM

## 2015-02-27 LAB — CBC WITH DIFFERENTIAL/PLATELET
BASOS ABS: 0 10*3/uL (ref 0.0–0.1)
Basophils Relative: 0.4 % (ref 0.0–3.0)
Eosinophils Absolute: 0.1 10*3/uL (ref 0.0–0.7)
Eosinophils Relative: 0.9 % (ref 0.0–5.0)
HEMATOCRIT: 38.8 % — AB (ref 39.0–52.0)
HEMOGLOBIN: 12.8 g/dL — AB (ref 13.0–17.0)
LYMPHS PCT: 19.2 % (ref 12.0–46.0)
Lymphs Abs: 2.1 10*3/uL (ref 0.7–4.0)
MCHC: 32.9 g/dL (ref 30.0–36.0)
MCV: 94.5 fl (ref 78.0–100.0)
MONOS PCT: 8.2 % (ref 3.0–12.0)
Monocytes Absolute: 0.9 10*3/uL (ref 0.1–1.0)
NEUTROS PCT: 71.3 % (ref 43.0–77.0)
Neutro Abs: 7.8 10*3/uL — ABNORMAL HIGH (ref 1.4–7.7)
Platelets: 230 10*3/uL (ref 150.0–400.0)
RBC: 4.1 Mil/uL — AB (ref 4.22–5.81)
RDW: 17 % — ABNORMAL HIGH (ref 11.5–15.5)
WBC: 10.9 10*3/uL — AB (ref 4.0–10.5)

## 2015-02-28 ENCOUNTER — Other Ambulatory Visit: Payer: Self-pay

## 2015-02-28 MED ORDER — MERCAPTOPURINE 50 MG PO TABS: ORAL_TABLET | ORAL | Status: DC

## 2015-03-16 ENCOUNTER — Other Ambulatory Visit: Payer: Self-pay | Admitting: *Deleted

## 2015-03-16 MED ORDER — MERCAPTOPURINE 50 MG PO TABS: ORAL_TABLET | ORAL | Status: DC

## 2015-04-20 ENCOUNTER — Ambulatory Visit: Payer: Medicare Other | Admitting: Gastroenterology

## 2015-05-06 ENCOUNTER — Other Ambulatory Visit: Payer: Self-pay | Admitting: Physician Assistant

## 2015-05-08 ENCOUNTER — Telehealth: Payer: Self-pay | Admitting: *Deleted

## 2015-05-08 ENCOUNTER — Other Ambulatory Visit: Payer: Self-pay | Admitting: *Deleted

## 2015-05-08 DIAGNOSIS — K50118 Crohn's disease of large intestine with other complication: Secondary | ICD-10-CM

## 2015-05-08 MED ORDER — MERCAPTOPURINE 50 MG PO TABS: 50.0000 mg | ORAL_TABLET | Freq: Every day | ORAL | Status: DC

## 2015-05-08 NOTE — Telephone Encounter (Signed)
-----   Message from [employer], PA-C sent at 05/08/2015 10:25 AM EST ----- Regarding: RE: RX- Mercaptipurine Can refill, but please have pt go for a cbc and cmet a few days before his visit with Dr Havery Moros and have results sent to Dr Havery Moros. ----- Message -----    From: Tonette Bihari, CMA    Sent: 05/08/2015   9:02 AM      To: Vita Barley Hvozdovic, PA-C Subject: RX- Mercaptipurine                             Lori, You saw this Crohn's patient on 02-16-2016.  He has an appt with Dr. Havery Moros on 05-22-2015.   Pharmacy sent me a refill request. Do you want to refill it or do you want me to send this to Dr. Havery Moros ?  Thanks,  Marisue Humble CMA

## 2015-05-08 NOTE — Telephone Encounter (Signed)
Called and advised the patient he has an appointment with Dr. Havery Moros on 05-22-2015.  He needs to come to our basement level lab on 05-17-2015 for labs. When he see's Dr. Havery Moros on Monday the 13th the results will be available for the doctor.  I also advised  there is a prescription refill sent to [facility] for Mercaptipurine.

## 2015-05-13 ENCOUNTER — Other Ambulatory Visit: Payer: Self-pay | Admitting: Internal Medicine

## 2015-05-17 ENCOUNTER — Other Ambulatory Visit (INDEPENDENT_AMBULATORY_CARE_PROVIDER_SITE_OTHER): Payer: Medicare Other

## 2015-05-17 DIAGNOSIS — K50118 Crohn's disease of large intestine with other complication: Secondary | ICD-10-CM

## 2015-05-17 LAB — CBC WITH DIFFERENTIAL/PLATELET
BASOS ABS: 0 10*3/uL (ref 0.0–0.1)
BASOS PCT: 0.4 % (ref 0.0–3.0)
Eosinophils Absolute: 0.1 10*3/uL (ref 0.0–0.7)
Eosinophils Relative: 1.3 % (ref 0.0–5.0)
HCT: 36.6 % — ABNORMAL LOW (ref 39.0–52.0)
Hemoglobin: 12.2 g/dL — ABNORMAL LOW (ref 13.0–17.0)
LYMPHS ABS: 1.4 10*3/uL (ref 0.7–4.0)
Lymphocytes Relative: 17.3 % (ref 12.0–46.0)
MCHC: 33.4 g/dL (ref 30.0–36.0)
MCV: 91.8 fl (ref 78.0–100.0)
MONOS PCT: 9 % (ref 3.0–12.0)
Monocytes Absolute: 0.7 10*3/uL (ref 0.1–1.0)
NEUTROS ABS: 5.7 10*3/uL (ref 1.4–7.7)
NEUTROS PCT: 72 % (ref 43.0–77.0)
PLATELETS: 242 10*3/uL (ref 150.0–400.0)
RBC: 3.99 Mil/uL — ABNORMAL LOW (ref 4.22–5.81)
RDW: 14.3 % (ref 11.5–15.5)
WBC: 7.9 10*3/uL (ref 4.0–10.5)

## 2015-05-17 LAB — COMPREHENSIVE METABOLIC PANEL
ALK PHOS: 39 U/L (ref 39–117)
ALT: 10 U/L (ref 0–53)
AST: 16 U/L (ref 0–37)
Albumin: 3.4 g/dL — ABNORMAL LOW (ref 3.5–5.2)
BILIRUBIN TOTAL: 0.7 mg/dL (ref 0.2–1.2)
BUN: 14 mg/dL (ref 6–23)
CALCIUM: 8.9 mg/dL (ref 8.4–10.5)
CO2: 29 mEq/L (ref 19–32)
Chloride: 108 mEq/L (ref 96–112)
Creatinine, Ser: 1.25 mg/dL (ref 0.40–1.50)
GFR: 59.2 mL/min — AB (ref >60.00)
Glucose, Bld: 105 mg/dL — ABNORMAL HIGH (ref 70–99)
Potassium: 3.8 mEq/L (ref 3.5–5.1)
Sodium: 143 mEq/L (ref 135–145)
TOTAL PROTEIN: 5.7 g/dL — AB (ref 6.0–8.3)

## 2015-05-22 ENCOUNTER — Ambulatory Visit (INDEPENDENT_AMBULATORY_CARE_PROVIDER_SITE_OTHER): Payer: Medicare Other | Admitting: Gastroenterology

## 2015-05-22 ENCOUNTER — Other Ambulatory Visit (INDEPENDENT_AMBULATORY_CARE_PROVIDER_SITE_OTHER): Payer: Medicare Other

## 2015-05-22 ENCOUNTER — Encounter: Payer: Self-pay | Admitting: Gastroenterology

## 2015-05-22 VITALS — BP 130/72 | HR 100 | Ht 70.5 in | Wt 171.0 lb

## 2015-05-22 DIAGNOSIS — K50919 Crohn's disease, unspecified, with unspecified complications: Secondary | ICD-10-CM | POA: Diagnosis not present

## 2015-05-22 DIAGNOSIS — Z86010 Personal history of colonic polyps: Secondary | ICD-10-CM

## 2015-05-22 LAB — FERRITIN: Ferritin: 25.7 ng/mL (ref 22.0–322.0)

## 2015-05-22 LAB — IBC PANEL
Iron: 180 ug/dL — ABNORMAL HIGH (ref 42–165)
Saturation Ratios: 38.5 % (ref 20.0–50.0)
Transferrin: 334 mg/dL (ref 212.0–360.0)

## 2015-05-22 LAB — C-REACTIVE PROTEIN

## 2015-05-22 LAB — SEDIMENTATION RATE: SED RATE: 18 mm/h (ref 0–22)

## 2015-05-22 MED ORDER — NA SULFATE-K SULFATE-MG SULF 17.5-3.13-1.6 GM/177ML PO SOLN: 1.0000 | Freq: Once | ORAL | Status: DC

## 2015-05-22 NOTE — Patient Instructions (Signed)
Discontinue mercaptopurine.   We have given you information on Entyvio. We will pre-cert this with your insurance company and be in touch with you.  Your physician has requested that you go to the basement for lab work before leaving today.  You have been scheduled for a colonoscopy. Please follow written instructions given to you at your visit today.  Please pick up your prep supplies at the pharmacy within the next 1-3 days. If you use inhalers (even only as needed), please bring them with you on the day of your procedure. Marland Kitchen

## 2015-05-22 NOTE — Progress Notes (Signed)
HPI :  81 y/o male with a longstanding history of stricturing ileal Crohns disease, here for follow up. He also has a remote history of prostate cancer in remission, and TIA. He was previously followed by Dr. Olevia Perches but is new to me. We reviewed his history at length today. He was diagnosed when he was in his early 46s. He has had 2 prior small bowel resections (ileocectomy and ileal resection per patient, in [place]). He has been hospitalized in 2014 and December 2016 for bowel obstructions which resolved with bowel rest.  He has been on prednisone over the years intermittently. He has been on 6MP for several years at low dose. He is taking one tablet per day. He tolerates it well without side effects.   In general he is feeling well since the hospitalization. He was prednisone for a period of time after his hospitalization and now off. He has been on 6MP monotherapy otherwise.  He denies any routine NSAID use other than aspirin 77m. He denies any abdominal pains. He is eating well, no vomiting. He has roughly one BM per day right now. He generally feels well without issues. He has gained a few pounds. He has a history of prostate cancer and is in remission.  He denies tobacco use. He has an ongoing mild anemia.  His last colonoscopy was in 12/2012 - he had a large tubulovilloous adenoma and another adenoma, one in ascending and one in sigmoid, not entirely removed given and attempted to be removed in piecemeal per Dr. BOlevia Perches   Past Medical History  Diagnosis Date  . [place]homocysteinemia ([place]   . Hyperlipidemia   . Small bowel obstruction ([place]   . History of adenomatous polyp of colon   . Vitamin B12 deficiency   . [place]hn disease ([place]   . Prostate cancer ([place]   . GERD (gastroesophageal reflux disease)   . Diverticulosis   . Dehydration 03/01/2013    Diarrhea due to crohn's disease.   .Marland KitchenTIA (transient ischemic attack)     HX OF   . SBO (small bowel obstruction) ([place]  03/17/2014     Past Surgical History  Procedure Laterality Date  . Cholecystectomy    . Terminal ileum resection    . Appendectomy    . Cataract extraction      both eyes   Family History  Problem Relation Age of Onset  . Crohn's disease Mother   . Heart disease Father   . Stroke Father   . Diabetes Paternal Grandfather   . Colon cancer Neg Hx   . Esophageal cancer Neg Hx   . Rectal cancer Neg Hx   . Stomach cancer Neg Hx    Social History  Substance Use Topics  . Smoking status: Former SResearch scientist (life sciences) . Smokeless tobacco: Never Used  . Alcohol Use: Yes     Comment: occasionally   Current Outpatient Prescriptions  Medication Sig Dispense Refill  . aspirin 81 MG tablet Take 81 mg by mouth daily.    . B-12, Methylcobalamin, 1000 MCG SUBL Place 1 tablet under the tongue daily.    . diphenoxylate-atropine (LOMOTIL) 2.5-0.025 MG tablet TAKE ONE TABLET 1 OR 2 TIMES DAILY AS NEEDED 60 tablet 0  . fenofibrate 54 MG tablet Take 54 mg by mouth daily.    . Multiple Vitamins-Minerals (PRESERVISION AREDS 2 PO) Take 1 capsule by mouth daily.    .Marland Kitchenomeprazole (PRILOSEC) 20 MG capsule Take 1 capsule (20 mg total) by  mouth daily. 30 capsule 0  . Na Sulfate-K Sulfate-Mg Sulf SOLN Take 1 kit by mouth once. 354 mL 0   No current facility-administered medications for this visit.   No Known Allergies   Review of Systems: All systems reviewed and negative except where noted in HPI.   Lab Results  Component Value Date   WBC 7.9 05/17/2015   HGB 12.2* 05/17/2015   HCT 36.6* 05/17/2015   MCV 91.8 05/17/2015   PLT 242.0 05/17/2015    Lab Results  Component Value Date   CREATININE 1.25 05/17/2015   BUN 14 05/17/2015   NA 143 05/17/2015   K 3.8 05/17/2015   CL 108 05/17/2015   CO2 29 05/17/2015    Lab Results  Component Value Date   ALT 10 05/17/2015   AST 16 05/17/2015   ALKPHOS 39 05/17/2015   BILITOT 0.7 05/17/2015     Physical Exam: BP 130/72 mmHg  Pulse 100  Ht 5' 10.5"  (1.791 m)  Wt 171 lb (77.565 kg)  BMI 24.18 kg/m2 Constitutional: Pleasant,well-developed, male in no acute distress. HEENT: Normocephalic and atraumatic. Conjunctivae are normal. No scleral icterus. Neck supple.  Cardiovascular: Normal rate, regular rhythm.  Pulmonary/chest: Effort normal and breath sounds normal. No wheezing, rales or rhonchi. Abdominal: Soft, nondistended, nontender. Bowel sounds active throughout. There are no masses palpable. No hepatomegaly. Extremities: no edema Lymphadenopathy: No cervical adenopathy noted. Neurological: Alert and oriented to person place and time. Skin: Skin is warm and dry. No rashes noted. Psychiatric: Normal mood and affect. Behavior is normal.   ASSESSMENT AND PLAN: 81 y/o male with longstanding history of stricturing ileal Crohns disease as outlined above. He has had 2 prior surgeries remotely, and 2 hospitalizations within the past 3 years for bowel obstructions which resolved with conservative therapy. In general he feels well at this time.   We discussed his regimen historically, and he has mostly used prednisone earlier in his diagnosis, and in recent years on low dose 6MP. I discussed treatment options moving forward, to include stopping all therapy and monitoring. At this time, he has ongoing anemia and likely active Crohns of the distal small bowel, perhaps with fibrotic stricture, although aside from his symptoms during hospitalization, he is feeling pretty well right now. On his current dose of 6MP at 33m daily, he is underdosed (should at least1-1.5 mg/kg) and I doubt his 6TG levels are therapeutic on 528mdaily. In this light, I think he is getting most of the risks of the drug without the benefit. At his age, thiopurines are arguably the highest risk compared to other medication classes for Crohns, with the increased risk of lymphoma in the elderly. However, I do think he warrants some type of therapy for his Crohns given his multiple  admissions in recent years for this. At this time I would recommend he either increase 6MP to therapeutic dosing, or stop it entirely and switch to another regimen. I discussed anti-TNF therapy, Entyvio, Stelara, and methotrexate with him in regards to treatment options for small bowel Crohns, including the risks/benefits of each, and the risks of Crohns in general without treatment at all. Given the safely profile of Entyvio, I think he is a good candidate for this drug and it will likely provide much more benefit than low dose 6MP. Following our discussion of all options he was agreeable to stop 6MP and [facility] quantiferon gold to ensure negative and see if we can obtain approval from his insurance.  Otherwise, recommend we check his iron panel and ESR/CRP prior to starting therapy.   I otherwise reviewed his last colonoscopy from 2014. He had 2 large adenomatous polyps (one tubulovillous) which was attempted to be removed in piecemeal but residual polyp was noted. He has not had a follow up exam since that time. I discussed my concerns with him regarding these polyps, and risk for interval enlargement or advancement. I am recommending a colonoscopy at this time to reassess the polypectomy sites and attempt to remove any residual adenoma. I will also re-evaluate the surgical anastomosis during this exam ans restage his Crohns. The indications, risks, and benefits of colonoscopy were explained to the patient in detail. Risks include but are not limited to bleeding, perforation, adverse reaction to medications, and cardiopulmonary compromise. Sequelae include but are not limited to the possibility of surgery, hospitalization, and mortality. The patient verbalized understanding and wished to proceed. All questions answered, referred to the scheduler and bowel prep ordered. Further recommendations pending results of the exam.   [place] Cellar, MD [facility] Pager  [phone]

## 2015-05-24 LAB — QUANTIFERON TB GOLD ASSAY (BLOOD)
INTERFERON GAMMA RELEASE ASSAY: NEGATIVE
Mitogen-Nil: 6.08 IU/mL
Quantiferon Nil Value: 0.09 IU/mL

## 2015-05-25 ENCOUNTER — Other Ambulatory Visit: Payer: Self-pay | Admitting: *Deleted

## 2015-05-25 DIAGNOSIS — K50919 Crohn's disease, unspecified, with unspecified complications: Secondary | ICD-10-CM

## 2015-05-31 ENCOUNTER — Encounter: Payer: Self-pay | Admitting: Gastroenterology

## 2015-05-31 ENCOUNTER — Telehealth: Payer: Self-pay | Admitting: *Deleted

## 2015-05-31 ENCOUNTER — Ambulatory Visit (AMBULATORY_SURGERY_CENTER): Payer: Medicare Other | Admitting: Gastroenterology

## 2015-05-31 VITALS — BP 132/73 | HR 85 | Temp 98.0°F | Resp 18 | Ht 70.0 in | Wt 171.0 lb

## 2015-05-31 DIAGNOSIS — K635 Polyp of colon: Secondary | ICD-10-CM | POA: Diagnosis not present

## 2015-05-31 DIAGNOSIS — K639 Disease of intestine, unspecified: Secondary | ICD-10-CM

## 2015-05-31 DIAGNOSIS — D122 Benign neoplasm of ascending colon: Secondary | ICD-10-CM

## 2015-05-31 DIAGNOSIS — K50919 Crohn's disease, unspecified, with unspecified complications: Secondary | ICD-10-CM | POA: Diagnosis present

## 2015-05-31 DIAGNOSIS — D125 Benign neoplasm of sigmoid colon: Secondary | ICD-10-CM

## 2015-05-31 MED ORDER — SODIUM CHLORIDE 0.9 % IV SOLN: 500.0000 mL | INTRAVENOUS | Status: DC

## 2015-05-31 NOTE — Telephone Encounter (Signed)
Yes this is too expensive. I will discuss other options with him. I completed his colonoscopy today and will await pathology results

## 2015-05-31 NOTE — Progress Notes (Signed)
Large right colon polyp was seen by Dr. Havery Moros which was not biopied.  We tatoo 1 site with 2.5 ml of spot. maw

## 2015-05-31 NOTE — Telephone Encounter (Signed)
Spoke with [facility]) and patient's copay for Ricardo Villa is $3101.01. He cannot afford this. Please, advise.

## 2015-05-31 NOTE — Progress Notes (Signed)
Patient awakening,vss,report to rn 

## 2015-05-31 NOTE — Patient Instructions (Signed)
YOU HAD AN ENDOSCOPIC PROCEDURE TODAY AT [facility]:   Refer to the procedure report that was given to you for any specific questions about what was found during the examination.  If the procedure report does not answer your questions, please call your gastroenterologist to clarify.  If you requested that your care partner not be given the details of your procedure findings, then the procedure report has been included in a sealed envelope for you to review at your convenience later.  YOU SHOULD EXPECT: Some feelings of bloating in the abdomen. Passage of more gas than usual.  Walking can help get rid of the air that was put into your GI tract during the procedure and reduce the bloating. If you had a lower endoscopy (such as a colonoscopy or flexible sigmoidoscopy) you may notice spotting of blood in your stool or on the toilet paper. If you underwent a bowel prep for your procedure, you may not have a normal bowel movement for a few days.  Please Note:  You might notice some irritation and congestion in your nose or some drainage.  This is from the oxygen used during your procedure.  There is no need for concern and it should clear up in a day or so.  SYMPTOMS TO REPORT IMMEDIATELY:   Following lower endoscopy (colonoscopy or flexible sigmoidoscopy):  Excessive amounts of blood in the stool  Significant tenderness or worsening of abdominal pains  Swelling of the abdomen that is new, acute  Fever of 100F or higher   For urgent or emergent issues, a gastroenterologist can be reached at any hour by calling [phone].   DIET: Your first meal following the procedure should be a small meal and then it is ok to progress to your normal diet. Heavy or fried foods are harder to digest and may make you feel nauseous or bloated.  Likewise, meals heavy in dairy and vegetables can increase bloating.  Drink plenty of fluids but you should avoid alcoholic beverages for 24  hours.  ACTIVITY:  You should plan to take it easy for the rest of today and you should NOT DRIVE or use heavy machinery until tomorrow (because of the sedation medicines used during the test).    FOLLOW UP: Our staff will call the number listed on your records the next business day following your procedure to check on you and address any questions or concerns that you may have regarding the information given to you following your procedure. If we do not reach you, we will leave a message.  However, if you are feeling well and you are not experiencing any problems, there is no need to return our call.  We will assume that you have returned to your regular daily activities without incident.  If any biopsies were taken you will be contacted by phone or by letter within the next 1-3 weeks.  Please call us at [phone] if you have not heard about the biopsies in 3 weeks.    SIGNATURES/CONFIDENTIALITY: You and/or your care partner have signed paperwork which will be entered into your electronic medical record.  These signatures attest to the fact that that the information above on your After Visit Summary has been reviewed and is understood.  Full responsibility of the confidentiality of this discharge information lies with you and/or your care-partner.  Polyp and hemorrhoid information given.

## 2015-05-31 NOTE — Progress Notes (Signed)
Called to room to assist during endoscopic procedure.  Patient ID and intended procedure confirmed with present staff. Received instructions for my participation in the procedure from the performing physician.  

## 2015-06-01 ENCOUNTER — Telehealth: Payer: Self-pay | Admitting: *Deleted

## 2015-06-01 NOTE — Telephone Encounter (Signed)
  Follow up Call-  Call back number 05/31/2015 02/17/2013  Post procedure Call Back phone  # [phone]  Permission to leave phone message Yes Yes     Patient questions:  Do you have a fever, pain , or abdominal swelling? No. Pain Score  0 *  Have you tolerated food without any problems? Yes.    Have you been able to return to your normal activities? Yes.    Do you have any questions about your discharge instructions: Diet   No. Medications  No. Follow up visit  No.  Do you have questions or concerns about your Care? No.  Actions: * If pain score is 4 or above: No action needed, pain <4.

## 2015-06-01 NOTE — Op Note (Signed)
Ricardo Villa Patient Name: Ricardo Villa Procedure Date: 05/31/2015 2:25 PM MRN: [id number] Endoscopist: Remo Lipps P. Havery Moros , MD Age: 81 Referring MD:  Date of Birth: October 08, 1934 Gender: Male Procedure:                Colonoscopy Indications:              Follow-up of Crohn's disease of the small bowel,                            history of incomplete resection of tubulovillous                            adenoma in 2014, here for surveillance exam Medicines:                Monitored Anesthesia Care Procedure:                Pre-Anesthesia Assessment:                           - Prior to the procedure, a History and Physical                            was performed, and patient medications and                            allergies were reviewed. The patient's tolerance of                            previous anesthesia was also reviewed. The risks                            and benefits of the procedure and the sedation                            options and risks were discussed with the patient.                            All questions were answered, and informed consent                            was obtained. Prior Anticoagulants: The patient has                            taken aspirin, last dose was 1 day prior to                            procedure. ASA Grade Assessment: III - A patient                            with severe systemic disease. After reviewing the                            risks and benefits, the patient was deemed in  satisfactory condition to undergo the procedure.                           After obtaining informed consent, the colonoscope                            was passed under direct vision. Throughout the                            procedure, the patient's blood pressure, pulse, and                            oxygen saturations were monitored continuously. The                            Model PCF-H190L [id number])  scope was introduced                            through the anus and advanced to the the                            ileocolonic anastomosis. The colonoscopy was                            performed without difficulty. The patient tolerated                            the procedure well. The quality of the bowel                            preparation was adequate. Surgical anastomosis were                            photographed. Scope In: 2:39:01 PM Scope Out: 3:14:00 PM Scope Withdrawal Time: 0 hours 27 minutes 15 seconds  Total Procedure Duration: 0 hours 34 minutes 59 seconds  Findings:      The perianal and digital rectal examinations were normal.      The surgical anastomosis was reached. Due to angulations in the area,       the small bowel was not able to be intubated. There was a polypoid area       of on the small bowel mucosa at the anastomosis which was removed with       cold snare. Additional nodular colonic mucosa vs. small bowel mucosa was       noted at the anastomosis. Biopsies were taken with a cold forceps for       histology.      A large, laterally spreading polyp was found in the ascending colon, at       the suspected site of prior polypectomy attempt in 2014. The polyp was       sessile, overlying a fold, with margins extending out to distance of       roughly 4cm or so. This was not attempted to be removed today, as       options for resection need to be discussed with the patient. Biopsies  were not obtained to avoid scarring the polyp down. Area distal to the       polyp was tattooed with an injection of Spot (carbon black).      A 7 mm polyp was found in the sigmoid colon. The polyp was sessile. The       polyp was removed with a cold snare. Resection and retrieval were       complete.      Non-bleeding internal hemorrhoids were found during retroflexion. The       hemorrhoids were moderate.      The exam was otherwise without  abnormality. Complications:            No immediate complications. Estimated blood loss:                            Minimal. Estimated Blood Loss:     Estimated blood loss was minimal. Impression:               - Poylpoid and nodular mucosa at the colonic                            anastomosis. Biopsied.                           - One large polyp in the ascending colon.                            Endoscopic attempts at removal not attempted today                            given the size of the lesion. Tattooed.                           - One 7 mm polyp in the sigmoid colon, removed with                            a cold snare. Resected and retrieved.                           - Non-bleeding internal hemorrhoids.                           - The examination was otherwise normal. Recommendation:           - Patient has a contact number available for                            emergencies. The signs and symptoms of potential                            delayed complications were discussed with the                            patient. Return to normal activities tomorrow.                            Written discharge instructions  were provided to the                            patient.                           - Resume previous diet.                           - Continue present medications.                           - Await pathology results.                           - We will discuss options for polypectomy. If done                            endoscopically, will need to be done at the                            hospital. Procedure Code(s):        --- Professional ---                           [phone], Colonoscopy, flexible; with removal of                            tumor(s), polyp(s), or other lesion(s) by snare                            technique                           45380, 59, Colonoscopy, flexible; with biopsy,                            single or multiple                            45381, Colonoscopy, flexible; with directed                            submucosal injection(s), any substance CPT copyright 2016 American Medical Association. All rights reserved. Remo Lipps P. Havery Moros, MD Carlota Raspberry. Havery Moros, MD 05/31/2015 3:25:46 PM This report has been signed electronically. Number of Addenda: 0 Referring MD:      Harley Hallmark

## 2015-06-09 ENCOUNTER — Other Ambulatory Visit: Payer: Self-pay | Admitting: *Deleted

## 2015-06-09 DIAGNOSIS — K50119 Crohn's disease of large intestine with unspecified complications: Secondary | ICD-10-CM

## 2015-06-13 ENCOUNTER — Telehealth: Payer: Self-pay | Admitting: *Deleted

## 2015-06-13 NOTE — Telephone Encounter (Signed)
Spoke with Sharee Pimple at [facility] endo and scheduled Prop colonoscopy on 07/03/15 at 7:30 AM.

## 2015-06-14 ENCOUNTER — Other Ambulatory Visit: Payer: Self-pay | Admitting: *Deleted

## 2015-06-14 NOTE — Telephone Encounter (Signed)
Unable to reach patient. Wife states he will be home at 4 PM. Will try again then.

## 2015-06-15 NOTE — Telephone Encounter (Signed)
Rescheduled procedure on 07/24/15 at 7:30 AM. Sent a note to Dr. Havery Moros to see if this day is ok for him also.

## 2015-06-15 NOTE — Telephone Encounter (Signed)
Spoke with patient and he cannot do this date. He is asking for it to be done in May. Spoke with Sharee Pimple

## 2015-06-16 ENCOUNTER — Telehealth: Payer: Self-pay | Admitting: *Deleted

## 2015-06-16 NOTE — Telephone Encounter (Signed)
Patient states Dr. Havery Moros knows that he had a lot of mucous after last colonoscopy and he was going to change something on the next procedure(07/24/15) Please, advise.

## 2015-06-16 NOTE — Telephone Encounter (Signed)
Yes that would be perfect. Can you please remind me when it gets close. Thanks       Previous Messages     ----- Message -----   From: Hulan Saas, RN   Sent: 06/15/2015  3:25 PM    To: Manus Gunning, MD   Dr. Havery Moros,  Patient could not do the procedure on 07/03/15. Can you do his colonoscopy at [facility] on 07/24/15 at 7:30 AM?  [facility] in [place]. Spoke with patient and gave him appointment date and time. Scheduled pre visit on 07/18/15 at 10:30 AM.

## 2015-06-16 NOTE — Telephone Encounter (Signed)
Spoke with patient and he states he did well with the prep. He reports lots of mucous in this throat after the procedure and after he got home he vomited. He will come for labs on Monday.

## 2015-06-16 NOTE — Telephone Encounter (Signed)
I unfortunately don't recall what he is referring to. Did he tolerate the preparation okay? We could try a different bowel preparation if that was the issue. Please remind him to go to the lab for TPMT enzyme assay which remains pending. Thanks

## 2015-06-17 NOTE — Telephone Encounter (Signed)
Ok yes I recall that. We can give him something to help dry out his secretions and some zofran for nausea. Hopefully he does better with anesthesia this time around. Thanks

## 2015-06-19 ENCOUNTER — Telehealth: Payer: Self-pay | Admitting: Gastroenterology

## 2015-06-19 NOTE — Telephone Encounter (Signed)
See alternate note

## 2015-06-19 NOTE — Telephone Encounter (Signed)
Pt was taken off of 6 MP -- mercaptopurine prior to his colonoscopy,  He was put on Entyvio but it was to expensive.  Should he go back on 6 mp?

## 2015-06-19 NOTE — Telephone Encounter (Signed)
patient is wanting to know if he should go back on MP3? before he has his blood test?

## 2015-06-19 NOTE — Telephone Encounter (Signed)
He can go back on his old dose, but I wanted him to have the TPMT done so I can dose him by full weight based dosing, I don't think 53m is nearly enough for him, but TPMT result will help clarify. I would ask him to get the blood work first, I should have the result in a week or so. Thanks

## 2015-06-20 NOTE — Telephone Encounter (Signed)
The pt is aware to come in and have the labs done and then we will call him regarding how much 6 mp to take and when to restart

## 2015-06-26 ENCOUNTER — Other Ambulatory Visit: Payer: Medicare Other

## 2015-06-26 DIAGNOSIS — K50119 Crohn's disease of large intestine with unspecified complications: Secondary | ICD-10-CM

## 2015-07-04 LAB — THIOPURINE METHYLTRANSFERASE (TPMT), RBC: Thiopurine Methyltransferase, RBC: 19 nmol/hr/mL RBC

## 2015-07-17 ENCOUNTER — Encounter (HOSPITAL_COMMUNITY): Payer: Self-pay | Admitting: *Deleted

## 2015-07-19 ENCOUNTER — Ambulatory Visit (AMBULATORY_SURGERY_CENTER): Payer: Self-pay | Admitting: *Deleted

## 2015-07-19 VITALS — Ht 71.0 in | Wt 170.0 lb

## 2015-07-19 DIAGNOSIS — Z86010 Personal history of colonic polyps: Secondary | ICD-10-CM

## 2015-07-19 MED ORDER — NA SULFATE-K SULFATE-MG SULF 17.5-3.13-1.6 GM/177ML PO SOLN
1.0000 | Freq: Once | ORAL | Status: DC
Start: 2015-07-19 — End: 2015-11-30

## 2015-07-19 NOTE — Progress Notes (Signed)
No egg or soy allergy known to patient   issues with past sedation with any surgeries or procedures of vomiting lots of phglem after the last 2 colonoscopies and with gall bladder surgery  no intubation problems  No diet pills per patient No home 02 use per patient  No blood thinners per patient  Pt denies issues with constipation

## 2015-07-23 NOTE — Anesthesia Preprocedure Evaluation (Addendum)
Anesthesia Evaluation  Patient identified by MRN, date of birth, ID band Patient awake    Reviewed: Allergy & Precautions, NPO status , Patient's Chart, lab work & pertinent test results  History of Anesthesia Complications (+) PONV and history of anesthetic complications  Airway Mallampati: I       Dental  (+) Teeth Intact, Dental Advisory Given   Pulmonary neg pulmonary ROS, former smoker,    breath sounds clear to auscultation       Cardiovascular negative cardio ROS   Rhythm:Regular Rate:Normal     Neuro/Psych TIA   GI/Hepatic Neg liver ROS, GERD  ,Colon  polyps. Crohns   Endo/Other  negative endocrine ROS  Renal/GU      Musculoskeletal   Abdominal   Peds  Hematology negative hematology ROS (+)   Anesthesia Other Findings   Reproductive/Obstetrics                            Anesthesia Physical Anesthesia Plan  ASA: II  Anesthesia Plan:    Post-op Pain Management:    Induction: Intravenous  Airway Management Planned: Natural Airway and Nasal Cannula  Additional Equipment:   Intra-op Plan:   Post-operative Plan:   Informed Consent: I have reviewed the patients History and Physical, chart, labs and discussed the procedure including the risks, benefits and alternatives for the proposed anesthesia with the patient or authorized representative who has indicated his/her understanding and acceptance.   Dental advisory given  Plan Discussed with: CRNA and Surgeon  Anesthesia Plan Comments:         Anesthesia Quick Evaluation

## 2015-07-24 ENCOUNTER — Encounter (HOSPITAL_COMMUNITY)
Admission: RE | Disposition: A | Discharge status: Home / Self Care | Payer: Self-pay | Source: Ambulatory Visit | Attending: Gastroenterology

## 2015-07-24 ENCOUNTER — Encounter (HOSPITAL_COMMUNITY): Payer: Self-pay

## 2015-07-24 ENCOUNTER — Ambulatory Visit (HOSPITAL_COMMUNITY): Payer: Medicare Other | Admitting: Anesthesiology

## 2015-07-24 ENCOUNTER — Ambulatory Visit (HOSPITAL_COMMUNITY)
Admission: RE | Admit: 2015-07-24 | Discharge: 2015-07-24 | Disposition: A | Discharge status: Home / Self Care | Payer: Medicare Other | Source: Ambulatory Visit | Attending: Gastroenterology | Admitting: Gastroenterology

## 2015-07-24 DIAGNOSIS — H409 Unspecified glaucoma: Secondary | ICD-10-CM | POA: Insufficient documentation

## 2015-07-24 DIAGNOSIS — Z980 Intestinal bypass and anastomosis status: Secondary | ICD-10-CM | POA: Diagnosis not present

## 2015-07-24 DIAGNOSIS — D122 Benign neoplasm of ascending colon: Secondary | ICD-10-CM | POA: Diagnosis not present

## 2015-07-24 DIAGNOSIS — Z86010 Personal history of colonic polyps: Secondary | ICD-10-CM | POA: Insufficient documentation

## 2015-07-24 DIAGNOSIS — Z9049 Acquired absence of other specified parts of digestive tract: Secondary | ICD-10-CM | POA: Insufficient documentation

## 2015-07-24 DIAGNOSIS — K648 Other hemorrhoids: Secondary | ICD-10-CM | POA: Insufficient documentation

## 2015-07-24 DIAGNOSIS — C61 Malignant neoplasm of prostate: Secondary | ICD-10-CM | POA: Insufficient documentation

## 2015-07-24 DIAGNOSIS — K5000 Crohn's disease of small intestine without complications: Secondary | ICD-10-CM | POA: Diagnosis not present

## 2015-07-24 DIAGNOSIS — Z8673 Personal history of transient ischemic attack (TIA), and cerebral infarction without residual deficits: Secondary | ICD-10-CM | POA: Diagnosis not present

## 2015-07-24 DIAGNOSIS — Z87891 Personal history of nicotine dependence: Secondary | ICD-10-CM | POA: Insufficient documentation

## 2015-07-24 HISTORY — DX: Nausea with vomiting, unspecified: R11.2

## 2015-07-24 HISTORY — PX: COLONOSCOPY WITH PROPOFOL: SHX5780

## 2015-07-24 HISTORY — DX: Other specified postprocedural states: Z98.890

## 2015-07-24 SURGERY — COLONOSCOPY WITH PROPOFOL
Anesthesia: Monitor Anesthesia Care

## 2015-07-24 SURGERY — COLONOSCOPY
Anesthesia: Monitor Anesthesia Care

## 2015-07-24 MED ORDER — PROPOFOL 10 MG/ML IV BOLUS
INTRAVENOUS | Status: DC | PRN
  Administered 2015-07-24 (×13): 10 mg via INTRAVENOUS
  Administered 2015-07-24 (×2): 20 mg via INTRAVENOUS
  Administered 2015-07-24 (×7): 10 mg via INTRAVENOUS
  Administered 2015-07-24: 20 mg via INTRAVENOUS
  Administered 2015-07-24 (×9): 10 mg via INTRAVENOUS

## 2015-07-24 MED ORDER — LACTATED RINGERS IV SOLN
INTRAVENOUS | Status: DC
  Administered 2015-07-24: 1000 mL via INTRAVENOUS

## 2015-07-24 MED ORDER — SPOT INK MARKER SYRINGE KIT
PACK | SUBMUCOSAL | Status: AC
  Filled 2015-07-24: qty 5

## 2015-07-24 MED ORDER — PROPOFOL 10 MG/ML IV BOLUS
INTRAVENOUS | Status: AC
  Filled 2015-07-24: qty 40

## 2015-07-24 MED ORDER — ONDANSETRON HCL 4 MG/2ML IJ SOLN
INTRAMUSCULAR | Status: AC
  Filled 2015-07-24: qty 2

## 2015-07-24 MED ORDER — SODIUM CHLORIDE 0.9 % IJ SOLN
INTRAMUSCULAR | Status: AC
  Filled 2015-07-24: qty 10

## 2015-07-24 MED ORDER — ONDANSETRON HCL 4 MG/2ML IJ SOLN
INTRAMUSCULAR | Status: DC | PRN
  Administered 2015-07-24: 4 mg via INTRAVENOUS

## 2015-07-24 MED ORDER — SODIUM CHLORIDE 0.9 % IV SOLN: INTRAVENOUS | Status: DC

## 2015-07-24 MED ORDER — METHYLENE BLUE 0.5 % INJ SOLN
INTRAVENOUS | Status: AC
  Filled 2015-07-24: qty 10

## 2015-07-24 SURGICAL SUPPLY — 22 items

## 2015-07-24 NOTE — Interval H&P Note (Signed)
History and Physical Interval Note:  07/24/2015 7:20 AM  Ricardo Villa  has presented today for surgery, with the diagnosis of colon polyp  The various methods of treatment have been discussed with the patient and family. After consideration of risks, benefits and other options for treatment, the patient has consented to  Procedure(s): COLONOSCOPY WITH PROPOFOL (N/A) as a surgical intervention .  The patient's history has been reviewed, patient examined, no change in status, stable for surgery.  I have reviewed the patient's chart and labs.  Questions were answered to the patient's satisfaction.     Renelda Loma [name]

## 2015-07-24 NOTE — Discharge Instructions (Signed)
Colonoscopy, Care After These instructions give you information on caring for yourself after your procedure. Your doctor may also give you more specific instructions. Call your doctor if you have any problems or questions after your procedure. HOME CARE  Do not drive for 24 hours.  Do not sign important papers or use machinery for 24 hours.  You may shower.  You may go back to your usual activities, but go slower for the first 24 hours.  Take rest breaks often during the first 24 hours.  Walk around or use warm packs on your belly (abdomen) if you have belly cramping or gas.  Drink enough fluids to keep your pee (urine) clear or pale yellow.  Resume your normal diet. Avoid heavy or fried foods.  Avoid drinking alcohol for 24 hours or as told by your doctor.  Only take medicines as told by your doctor. If a tissue sample (biopsy) was taken during the procedure:   Do not take aspirin or blood thinners for 7 days, or as told by your doctor.  Do not drink alcohol for 7 days, or as told by your doctor.  Eat soft foods for the first 24 hours. GET HELP IF: You still have a small amount of blood in your poop (stool) 2-3 days after the procedure. GET HELP RIGHT AWAY IF:  You have more than a small amount of blood in your poop.  You see clumps of tissue (blood clots) in your poop.  Your belly is puffy (swollen).  You feel sick to your stomach (nauseous) or throw up (vomit).  You have a fever.  You have belly pain that gets worse and medicine does not help. MAKE SURE YOU:  Understand these instructions.  Will watch your condition.  Will get help right away if you are not doing well or get worse.   This information is not intended to replace advice given to you by your health care provider. Make sure you discuss any questions you have with your health care provider.   Document Released: 03/30/2010 Document Revised: 03/02/2013 Document Reviewed: 11/02/2012 Elsevier  Interactive Patient Education [employer].

## 2015-07-24 NOTE — Anesthesia Postprocedure Evaluation (Signed)
Anesthesia Post Note  Patient: Ricardo Villa  Procedure(s) Performed: Procedure(s) (LRB): COLONOSCOPY WITH PROPOFOL (N/A)  Patient location during evaluation: PACU Anesthesia Type: MAC Level of consciousness: awake and alert Pain management: pain level controlled Vital Signs Assessment: post-procedure vital signs reviewed and stable Respiratory status: spontaneous breathing, nonlabored ventilation, respiratory function stable and patient connected to nasal cannula oxygen Cardiovascular status: stable and blood pressure returned to baseline Anesthetic complications: no    Last Vitals:  Filed Vitals:   07/24/15 0840 07/24/15 0845  BP: 155/83   Pulse: 91 85  Temp:    Resp: 14 15    Last Pain: There were no vitals filed for this visit.               Aleza Pew,JAMES TERRILL

## 2015-07-24 NOTE — Transfer of Care (Signed)
Immediate Anesthesia Transfer of Care Note  Patient: Ricardo Villa  Procedure(s) Performed: Procedure(s): COLONOSCOPY WITH PROPOFOL (N/A)  Patient Location: PACU  Anesthesia Type:MAC  Level of Consciousness: sedated  Airway & Oxygen Therapy: Patient Spontanous Breathing and Patient connected to nasal cannula oxygen  Post-op Assessment: Report given to RN and Post -op Vital signs reviewed and stable  Post vital signs: Reviewed and stable  Last Vitals:  Filed Vitals:   07/24/15 0635  BP: 155/80  Pulse: 99  Temp: 36.7 C  Resp: 18    Last Pain: There were no vitals filed for this visit.       Complications: No apparent anesthesia complications

## 2015-07-24 NOTE — Op Note (Signed)
[facility] Patient Name: Ricardo Villa Procedure Date: 07/24/2015 MRN: [id number] Attending MD: Carlota Raspberry. Havery Moros , MD Date of Birth: 07/07/1934 CSN: [id number] Age: 81 Admit Type: Outpatient Procedure:                Colonoscopy Indications:              High risk colon cancer surveillance: Personal                            history of colonic polyps. Large ascending colon                            polyp, here for polypectomy Providers:                Remo Lipps P. Havery Moros, MD, Hilma Favors, RN,                            Zenon Mayo, RN, Cletis Athens, Technician Referring MD:              Medicines:                Monitored Anesthesia Care Complications:            No immediate complications. Estimated blood loss:                            Minimal. Estimated Blood Loss:     Estimated blood loss was minimal. Procedure:                Pre-Anesthesia Assessment:                           - Prior to the procedure, a History and Physical                            was performed, and patient medications and                            allergies were reviewed. The patient's tolerance of                            previous anesthesia was also reviewed. The risks                            and benefits of the procedure and the sedation                            options and risks were discussed with the patient.                            All questions were answered, and informed consent                            was obtained. Prior Anticoagulants: The patient has  taken aspirin, last dose was 1 day prior to                            procedure. ASA Grade Assessment: II - A patient                            with mild systemic disease. After reviewing the                            risks and benefits, the patient was deemed in                            satisfactory condition to undergo the procedure.                           After  obtaining informed consent, the colonoscope                            was passed under direct vision. Throughout the                            procedure, the patient's blood pressure, pulse, and                            oxygen saturations were monitored continuously. The                            EC-[id number] ([id number]) scope was introduced through                            the anus and advanced to the the ileocolonic                            anastomosis. The colonoscopy was performed without                            difficulty. The patient tolerated the procedure                            well. The quality of the bowel preparation was                            fair. The rectum, surgical anastomosis were                            photographed. Scope In: 7:27:26 AM Scope Out: 8:25:23 AM Scope Withdrawal Time: 0 hours 51 minutes 46 seconds  Total Procedure Duration: 0 hours 57 minutes 57 seconds  Findings:      The perianal and digital rectal examinations were normal.      There was evidence of a prior end-to-side ileo-colonic anastomosis in       the ascending colon. The prep was fair and flat or small polyps may not       have been well visualized. The ileum was  not able to be visualized given       angulated anastomosis.      A 35-40 mm laterally spreading polyp was found in the ascending colon /       hepatic flexure, with tatoo noted distal to it The polyp was sessile.       Area was successfully injected with several cc's of saline with       methylene blue for a lift polypectomy. The polyp was then removed in       piecemenal with a hot snare. One lateral portion of the polyp was       scarred down from prior polypectomy attempt (3 years ago) and was       difficult to remove despite using stiff snare, the tissue was difficult       to grasp. That being said I think the majority of the polyp was removed,       although potential for residual tissue was high in this lateral  area.       Fulguration to ablate the periphery of the lesion by argon plasma was       successful. To prevent bleeding after the polypectomy, three hemostatic       clips were successfully placed (2 on one lateral side, and one on the       other), although given the size of the lesion it was not able to be       closed (one additional clip was misfired and did not attach) There was       no bleeding at the end of the procedure.      Internal hemorrhoids were found during retroflexion. The majority of the       time of this case was spent removing the large polyp. Given the prep was       only fair and the patient's need for close follow up, the colon distal       to this area was not well-examined.      The exam was otherwise without abnormality. Impression:               - Preparation of the colon was fair.                           - End-to-side ileo-colonic anastomosis.                           - One 35-40 mm polyp in the ascending colon,                            removed with a hot snare in piecemeal. Injected.                            Treated with argon plasma coagulation (APC). Clips                            were placed.                           - Internal hemorrhoids.                           - The examination was otherwise  normal. Moderate Sedation:      N/A- Per Anesthesia Care Recommendation:           - Patient has a contact number available for                            emergencies. The signs and symptoms of potential                            delayed complications were discussed with the                            patient. Return to normal activities tomorrow.                            Written discharge instructions were provided to the                            patient.                           - Resume previous diet.                           - Continue present medications.                           - No aspirin, ibuprofen, naproxen, or other                             non-steroidal anti-inflammatory drugs for 2 weeks                            after polyp removal.                           - Await pathology results.                           - Repeat colonoscopy in 3 months for surveillance. Procedure Code(s):        --- Professional ---                           [phone], Colonoscopy, flexible; with removal of                            tumor(s), polyp(s), or other lesion(s) by snare                            technique                           45381, Colonoscopy, flexible; with directed                            submucosal injection(s), any substance Diagnosis Code(s):        --- Professional ---  Z86.010, Personal history of colonic polyps                           K64.8, Other hemorrhoids                           Z98.0, Intestinal bypass and anastomosis status                           D12.2, Benign neoplasm of ascending colon CPT copyright 2016 American Medical Association. All rights reserved. The codes documented in this report are preliminary and upon coder review may  be revised to meet current compliance requirements. Remo Lipps P. Jahseh Lucchese, MD 07/24/2015 8:40:45 AM This report has been signed electronically. Number of Addenda: 0

## 2015-07-24 NOTE — H&P (Signed)
HPI :  81 y/o male here for polypectomy of large ascending colon polyp, also with history of small bowel Crohns disease. Not on blood thinners. No change to medical history since I have last seen him.   Past Medical History  Diagnosis Date  . Hyperhomocysteinemia ([place])   . Hyperlipidemia   . Small bowel obstruction ([place])   . History of adenomatous polyp of colon   . Vitamin B12 deficiency   . Crohn disease ([place])   . GERD (gastroesophageal reflux disease)   . Diverticulosis   . Dehydration 03/01/2013    Diarrhea due to crohn's disease.   Marland Kitchen TIA (transient ischemic attack)     HX OF   . SBO (small bowel obstruction) ([place]) 03/17/2014  . PONV (postoperative nausea and vomiting)     "due to lots of mucus and phelgm in throat"-all with colonoscopy and years ago with gall bladder surgery   . Prostate cancer [facility])     "watchful waiting"- rechecks every 3- 6 months- Dr. Alinda Money  . Allergy   . Cataract     been removed   . Glaucoma      Past Surgical History  Procedure Laterality Date  . Cholecystectomy    . Terminal ileum resection    . Appendectomy    . Cataract extraction      both eyes  . Tonsillectomy    . Vasectomy    . Colonoscopy    . Polypectomy     Family History  Problem Relation Age of Onset  . Crohn's disease Mother   . Heart disease Father   . Stroke Father   . Diabetes Paternal Grandfather   . Colon cancer Neg Hx   . Esophageal cancer Neg Hx   . Rectal cancer Neg Hx   . Stomach cancer Neg Hx   . Colon polyps Neg Hx    Social History  Substance Use Topics  . Smoking status: Former Smoker    Types: Cigarettes    Quit date: 07/16/1988  . Smokeless tobacco: Never Used  . Alcohol Use: 0.0 oz/week    0 Standard drinks or equivalent per week     Comment: occasionally to rare   Current Facility-Administered Medications  Medication Dose Route Frequency Provider Last Rate Last Dose  . 0.9 %  sodium chloride infusion   Intravenous Continuous Manus Gunning, MD      . lactated ringers infusion   Intravenous Continuous Manus Gunning, MD 125 mL/hr at 07/24/15 0653 1,000 mL at 07/24/15 0653   No Known Allergies   Review of Systems: All systems reviewed and negative except where noted in HPI.    No results found.  Physical Exam: BP 155/80 mmHg  Pulse 99  Temp(Src) 98 F (36.7 C) (Oral)  Resp 18  Ht 5' 11"  (1.803 m)  Wt 170 lb (77.111 kg)  BMI 23.72 kg/m2  SpO2 97% Constitutional: Pleasant,well-developed, male in no acute distress. HEENT: Normocephalic and atraumatic. Conjunctivae are normal. No scleral icterus. Neck supple.  Cardiovascular: Normal rate, regular rhythm.  Pulmonary/chest: Effort normal and breath sounds normal. No wheezing, rales or rhonchi. Abdominal: Soft, nondistended, nontender. Bowel sounds active throughout. There are no masses palpable. No hepatomegaly. Extremities: no edema Lymphadenopathy: No cervical adenopathy noted. Neurological: Alert and oriented to person place and time. Skin: Skin is warm and dry. No rashes noted. Psychiatric: Normal mood and affect. Behavior is normal.   ASSESSMENT AND PLAN: 81 y./o male here for polypectomy for large ascending  colon polyp. The indications, risks, and benefits of colonoscopy were explained to the patient in detail. Risks include but are not limited to bleeding, perforation, adverse reaction to medications, and cardiopulmonary compromise. Sequelae include but are not limited to the possibility of surgery, hospitalization, and mortality. The patient verbalized understanding and wished to proceed. All questions answered. Further recommendations pending results of the exam.   Spencer Cellar, MD [facility] Gastroenterology Pager [phone]   No ref. provider found

## 2015-07-25 ENCOUNTER — Encounter (HOSPITAL_COMMUNITY): Payer: Self-pay | Admitting: Gastroenterology

## 2015-07-28 ENCOUNTER — Telehealth: Payer: Self-pay | Admitting: Gastroenterology

## 2015-07-28 DIAGNOSIS — K50119 Crohn's disease of large intestine with unspecified complications: Secondary | ICD-10-CM

## 2015-07-28 NOTE — Telephone Encounter (Signed)
Called patient to discuss a few issues:  Colonoscopy showed a large polyp in the right colon, removed in piecemeal, it was partially scarred down from prior polypectomy attempt 3 years ago and was difficult to remove. Taken off in piecemeal and treated with APC. I am recommending a repeat colonoscopy in 3 months for reassessment and ensure no residual adenoma. He agreed.   We otherwise discussed his long term Crohns regimen. Previously on low dose thiopurine which I don't think was providing benefit (last hospitalization in November). He also has prostate cancer which is being monitored and no treatment planned at this time. We discussed options moving forward for his Crohns. He is asymptomatic at this time, and while he was hospitalized last year for an obstruction, we could continue monitoring given the risks of medications available to him at this time. I otherwise discussed medical therapies with him. I think Entyvio is the safest option in regards to its risk profile, but we have looked into cost and they can't afford it. Given his age I think thiopurines are arguably the highest risk regimen given increased risk of lymphoma and leukemia in the elderly. He has stopped this already. We also discussed methotrexate, risks of this drug, and anti-TNF therapy as well. He is hesitant to committ to any therapy right now. I think methotrexate is a reasonable option at present time, will obtain baseline CBC and LFTs now to prepare for possible treatment, and he will think about his options and discuss with his wife. I will contact him with results of blood work and further discussion. He agreed.  Rollene Fare, can you order CBC, LFTs for this patient and put on your schedule for repeat colonoscopy to be done in 3 months at [facility]. Thanks

## 2015-07-28 NOTE — Telephone Encounter (Signed)
Labs in EPIC. Recall in EPIC.

## 2015-08-29 ENCOUNTER — Encounter: Payer: Self-pay | Admitting: Gastroenterology

## 2015-09-15 ENCOUNTER — Telehealth: Payer: Self-pay | Admitting: Gastroenterology

## 2015-09-18 NOTE — Telephone Encounter (Signed)
I left a message for the patient that he will be contacted by Dr. Doyne Keel nurse next week when she returns to arrange that appt.

## 2015-09-26 NOTE — Telephone Encounter (Signed)
Needs hospital colon with APC SA. 3 month f/u in August.

## 2015-09-26 NOTE — Telephone Encounter (Signed)
Yes, please book at [facility] at the next available half day there for procedures - he is not due until August, so the August or September day would work if you can help coordinate. Thanks!

## 2015-09-26 NOTE — Telephone Encounter (Signed)
Patient is calling to set up colonoscopy. Does he need this at [facility] again?

## 2015-09-27 ENCOUNTER — Other Ambulatory Visit: Payer: Self-pay | Admitting: *Deleted

## 2015-09-27 DIAGNOSIS — Z86010 Personal history of colonic polyps: Secondary | ICD-10-CM

## 2015-09-27 NOTE — Telephone Encounter (Signed)
Yes 10/3 would work fine. Thanks

## 2015-09-27 NOTE — Telephone Encounter (Signed)
No appointment available in August and patient is on vacation on 12/08/15. Do you want to put it on the 12/12/15 [facility] schedule?

## 2015-09-27 NOTE — Telephone Encounter (Signed)
Scheduled patient with Sharee Pimple at [place] on 12/12/15 at 8:30 AM. Previsit scheduled on 11/30/15 at 8:00 Am. Patient aware of appointments.

## 2015-11-16 ENCOUNTER — Other Ambulatory Visit: Payer: Self-pay | Admitting: *Deleted

## 2015-11-16 MED ORDER — DIPHENOXYLATE-ATROPINE 2.5-0.025 MG PO TABS: ORAL_TABLET | ORAL | 2 refills | Status: DC

## 2015-11-30 ENCOUNTER — Ambulatory Visit (AMBULATORY_SURGERY_CENTER): Payer: Self-pay

## 2015-11-30 VITALS — Ht 72.0 in | Wt 168.4 lb

## 2015-11-30 DIAGNOSIS — Z860100 Personal history of colon polyps, unspecified: Secondary | ICD-10-CM

## 2015-11-30 MED ORDER — SUPREP BOWEL PREP KIT 17.5-3.13-1.6 GM/177ML PO SOLN: 1.0000 | Freq: Once | ORAL | 0 refills | Status: AC

## 2015-11-30 NOTE — Progress Notes (Signed)
No allergies to eggs or soy No past problems with anesthesia except some N/V Please use propofol No diet meds No home oxygen  Declined emmi

## 2015-12-07 ENCOUNTER — Encounter (HOSPITAL_COMMUNITY): Payer: Self-pay | Admitting: *Deleted

## 2015-12-08 ENCOUNTER — Ambulatory Visit (HOSPITAL_COMMUNITY): Admit: 2015-12-08 | Payer: Self-pay | Admitting: Gastroenterology

## 2015-12-08 ENCOUNTER — Encounter (HOSPITAL_COMMUNITY): Payer: Self-pay

## 2015-12-08 SURGERY — COLONOSCOPY WITH PROPOFOL
Anesthesia: Monitor Anesthesia Care

## 2015-12-11 NOTE — Anesthesia Preprocedure Evaluation (Addendum)
Anesthesia Evaluation  Patient identified by MRN, date of birth, ID band Patient awake    Reviewed: Allergy & Precautions, NPO status , Patient's Chart, lab work & pertinent test results  History of Anesthesia Complications (+) PONV and history of anesthetic complications  Airway Mallampati: III       Dental  (+) Teeth Intact, Dental Advisory Given   Pulmonary neg shortness of breath, neg sleep apnea, neg COPD, neg recent URI, former smoker,    Pulmonary exam normal breath sounds clear to auscultation       Cardiovascular Exercise Tolerance: Good (-) hypertension(-) angina(-) Past MI, (-) Cardiac Stents, (-) CABG and (-) Orthopnea negative cardio ROS  (-) dysrhythmias  Rhythm:Regular Rate:Normal  HLD   Neuro/Psych neg Seizures TIA (about 12 years ago)   GI/Hepatic Neg liver ROS, Bowel prep,GERD  Controlled,Crohn's disease, diverticulosis   Endo/Other  negative endocrine ROS  Renal/GU negative Renal ROS     Musculoskeletal   Abdominal   Peds  Hematology negative hematology ROS (+)   Anesthesia Other Findings Prostate cancer, glaucoma, vitamin B12 deficiency, hyperhomocysteinemia  Reproductive/Obstetrics                           Anesthesia Physical Anesthesia Plan  ASA: II  Anesthesia Plan: MAC   Post-op Pain Management:    Induction: Intravenous  Airway Management Planned: Natural Airway and Nasal Cannula  Additional Equipment:   Intra-op Plan:   Post-operative Plan:   Informed Consent: I have reviewed the patients History and Physical, chart, labs and discussed the procedure including the risks, benefits and alternatives for the proposed anesthesia with the patient or authorized representative who has indicated his/her understanding and acceptance.   Dental advisory given  Plan Discussed with: CRNA  Anesthesia Plan Comments:        Anesthesia Quick Evaluation

## 2015-12-12 ENCOUNTER — Ambulatory Visit (HOSPITAL_COMMUNITY): Payer: Medicare Other | Admitting: Anesthesiology

## 2015-12-12 ENCOUNTER — Ambulatory Visit (HOSPITAL_COMMUNITY)
Admission: RE | Admit: 2015-12-12 | Discharge: 2015-12-12 | Disposition: A | Discharge status: Home / Self Care | Payer: Medicare Other | Source: Ambulatory Visit | Attending: Gastroenterology | Admitting: Gastroenterology

## 2015-12-12 ENCOUNTER — Encounter (HOSPITAL_COMMUNITY): Payer: Self-pay

## 2015-12-12 ENCOUNTER — Encounter (HOSPITAL_COMMUNITY)
Admission: RE | Disposition: A | Discharge status: Home / Self Care | Payer: Self-pay | Source: Ambulatory Visit | Attending: Gastroenterology

## 2015-12-12 DIAGNOSIS — Z8673 Personal history of transient ischemic attack (TIA), and cerebral infarction without residual deficits: Secondary | ICD-10-CM | POA: Insufficient documentation

## 2015-12-12 DIAGNOSIS — E785 Hyperlipidemia, unspecified: Secondary | ICD-10-CM | POA: Insufficient documentation

## 2015-12-12 DIAGNOSIS — K219 Gastro-esophageal reflux disease without esophagitis: Secondary | ICD-10-CM | POA: Insufficient documentation

## 2015-12-12 DIAGNOSIS — D122 Benign neoplasm of ascending colon: Secondary | ICD-10-CM | POA: Diagnosis not present

## 2015-12-12 DIAGNOSIS — Z1211 Encounter for screening for malignant neoplasm of colon: Secondary | ICD-10-CM | POA: Diagnosis present

## 2015-12-12 DIAGNOSIS — Z8379 Family history of other diseases of the digestive system: Secondary | ICD-10-CM | POA: Diagnosis not present

## 2015-12-12 DIAGNOSIS — Z833 Family history of diabetes mellitus: Secondary | ICD-10-CM | POA: Diagnosis not present

## 2015-12-12 DIAGNOSIS — Z8546 Personal history of malignant neoplasm of prostate: Secondary | ICD-10-CM | POA: Insufficient documentation

## 2015-12-12 DIAGNOSIS — D123 Benign neoplasm of transverse colon: Secondary | ICD-10-CM | POA: Insufficient documentation

## 2015-12-12 DIAGNOSIS — Z86010 Personal history of colonic polyps: Secondary | ICD-10-CM | POA: Diagnosis not present

## 2015-12-12 DIAGNOSIS — Z980 Intestinal bypass and anastomosis status: Secondary | ICD-10-CM | POA: Insufficient documentation

## 2015-12-12 DIAGNOSIS — H409 Unspecified glaucoma: Secondary | ICD-10-CM | POA: Insufficient documentation

## 2015-12-12 DIAGNOSIS — Z9841 Cataract extraction status, right eye: Secondary | ICD-10-CM | POA: Insufficient documentation

## 2015-12-12 DIAGNOSIS — Z87891 Personal history of nicotine dependence: Secondary | ICD-10-CM | POA: Diagnosis not present

## 2015-12-12 DIAGNOSIS — K5090 Crohn's disease, unspecified, without complications: Secondary | ICD-10-CM | POA: Diagnosis not present

## 2015-12-12 DIAGNOSIS — Z9852 Vasectomy status: Secondary | ICD-10-CM | POA: Diagnosis not present

## 2015-12-12 DIAGNOSIS — D126 Benign neoplasm of colon, unspecified: Secondary | ICD-10-CM

## 2015-12-12 DIAGNOSIS — Z9049 Acquired absence of other specified parts of digestive tract: Secondary | ICD-10-CM | POA: Insufficient documentation

## 2015-12-12 DIAGNOSIS — K648 Other hemorrhoids: Secondary | ICD-10-CM | POA: Diagnosis not present

## 2015-12-12 DIAGNOSIS — E860 Dehydration: Secondary | ICD-10-CM | POA: Diagnosis not present

## 2015-12-12 DIAGNOSIS — E538 Deficiency of other specified B group vitamins: Secondary | ICD-10-CM | POA: Insufficient documentation

## 2015-12-12 DIAGNOSIS — Z823 Family history of stroke: Secondary | ICD-10-CM | POA: Diagnosis not present

## 2015-12-12 DIAGNOSIS — Z9842 Cataract extraction status, left eye: Secondary | ICD-10-CM | POA: Insufficient documentation

## 2015-12-12 DIAGNOSIS — Z8249 Family history of ischemic heart disease and other diseases of the circulatory system: Secondary | ICD-10-CM | POA: Diagnosis not present

## 2015-12-12 HISTORY — PX: COLONOSCOPY WITH PROPOFOL: SHX5780

## 2015-12-12 HISTORY — PX: HOT HEMOSTASIS: SHX5433

## 2015-12-12 SURGERY — COLONOSCOPY WITH PROPOFOL
Anesthesia: Monitor Anesthesia Care

## 2015-12-12 MED ORDER — PROPOFOL 10 MG/ML IV BOLUS
INTRAVENOUS | Status: AC
  Filled 2015-12-12: qty 20

## 2015-12-12 MED ORDER — ONDANSETRON HCL 4 MG/2ML IJ SOLN
INTRAMUSCULAR | Status: AC
  Filled 2015-12-12: qty 2

## 2015-12-12 MED ORDER — PROPOFOL 10 MG/ML IV BOLUS
INTRAVENOUS | Status: AC
  Filled 2015-12-12: qty 60

## 2015-12-12 MED ORDER — SODIUM CHLORIDE 0.9 % IV SOLN: INTRAVENOUS | Status: DC

## 2015-12-12 MED ORDER — PROPOFOL 500 MG/50ML IV EMUL
INTRAVENOUS | Status: DC | PRN
  Administered 2015-12-12: 150 ug/kg/min via INTRAVENOUS

## 2015-12-12 MED ORDER — LACTATED RINGERS IV SOLN
INTRAVENOUS | Status: DC
  Administered 2015-12-12 (×2): via INTRAVENOUS

## 2015-12-12 MED ORDER — SPOT INK MARKER SYRINGE KIT
PACK | SUBMUCOSAL | Status: DC | PRN
  Administered 2015-12-12: 3 mL via SUBMUCOSAL

## 2015-12-12 MED ORDER — PROPOFOL 10 MG/ML IV BOLUS
INTRAVENOUS | Status: DC | PRN
  Administered 2015-12-12 (×2): 20 mg via INTRAVENOUS

## 2015-12-12 MED ORDER — ONDANSETRON HCL 4 MG/2ML IJ SOLN
INTRAMUSCULAR | Status: DC | PRN
  Administered 2015-12-12: 4 mg via INTRAVENOUS

## 2015-12-12 MED ORDER — SPOT INK MARKER SYRINGE KIT
PACK | SUBMUCOSAL | Status: AC
  Filled 2015-12-12: qty 5

## 2015-12-12 MED ORDER — LIDOCAINE 2% (20 MG/ML) 5 ML SYRINGE
INTRAMUSCULAR | Status: AC
  Filled 2015-12-12: qty 5

## 2015-12-12 MED ORDER — METHYLENE BLUE 0.5 % INJ SOLN
INTRAVENOUS | Status: AC
  Filled 2015-12-12: qty 10

## 2015-12-12 SURGICAL SUPPLY — 22 items

## 2015-12-12 NOTE — Discharge Instructions (Signed)
YOU HAD AN ENDOSCOPIC PROCEDURE TODAY: Refer to the procedure report and other information in the discharge instructions given to you for any specific questions about what was found during the examination. If this information does not answer your questions, please call [place] office at [phone] to clarify.  ° °YOU SHOULD EXPECT: Some feelings of bloating in the abdomen. Passage of more gas than usual. Walking can help get rid of the air that was put into your GI tract during the procedure and reduce the bloating. If you had a lower endoscopy (such as a colonoscopy or flexible sigmoidoscopy) you may notice spotting of blood in your stool or on the toilet paper. Some abdominal soreness may be present for a day or two, also. ° °DIET: Your first meal following the procedure should be a light meal and then it is ok to progress to your normal diet. A half-sandwich or bowl of soup is an example of a good first meal. Heavy or fried foods are harder to digest and may make you feel nauseous or bloated. Drink plenty of fluids but you should avoid alcoholic beverages for 24 hours. If you had a esophageal dilation, please see attached instructions for diet.   ° °ACTIVITY: Your care partner should take you home directly after the procedure. You should plan to take it easy, moving slowly for the rest of the day. You can resume normal activity the day after the procedure however YOU SHOULD NOT DRIVE, use power tools, machinery or perform tasks that involve climbing or major physical exertion for 24 hours (because of the sedation medicines used during the test).  ° °SYMPTOMS TO REPORT IMMEDIATELY: °A gastroenterologist can be reached at any hour. Please call [phone]  for any of the following symptoms:  °Following lower endoscopy (colonoscopy, flexible sigmoidoscopy) °Excessive amounts of blood in the stool  °Significant tenderness, worsening of abdominal pains  °Swelling of the abdomen that is new, acute  °Fever of 100° or  higher  °Following upper endoscopy (EGD, EUS, ERCP, esophageal dilation) °Vomiting of blood or coffee ground material  °New, significant abdominal pain  °New, significant chest pain or pain under the shoulder blades  °Painful or persistently difficult swallowing  °New shortness of breath  °Black, tarry-looking or red, bloody stools ° °FOLLOW UP:  °If any biopsies were taken you will be contacted by phone or by letter within the next 1-3 weeks. Call [phone]  if you have not heard about the biopsies in 3 weeks.  °Please also call with any specific questions about appointments or follow up tests. ° °

## 2015-12-12 NOTE — Transfer of Care (Signed)
Immediate Anesthesia Transfer of Care Note  Patient: Ricardo Villa  Procedure(s) Performed: Procedure(s): COLONOSCOPY WITH PROPOFOL (N/A) HOT HEMOSTASIS (ARGON PLASMA COAGULATION/BICAP) (N/A)  Patient Location: PACU  Anesthesia Type:MAC  Level of Consciousness:  sedated, patient cooperative and responds to stimulation  Airway & Oxygen Therapy:Patient Spontanous Breathing and Patient connected to face mask oxgen  Post-op Assessment:  Report given to PACU RN and Post -op Vital signs reviewed and stable  Post vital signs:  Reviewed and stable  Last Vitals:  Vitals:   12/12/15 0730  BP: (!) 167/70  Pulse: 87  Resp: 17  Temp: 23.5 C    Complications: No apparent anesthesia complications

## 2015-12-12 NOTE — H&P (Signed)
HPI :  81 y/o male here for a surveillance colonoscopy. He has a history of a large adenoma with attempted removal initially in 2014, colonoscopy in May had attempt at removal of 3-4cm lesion in the right colon. Polyp was scarred down from initial polypectomy attempt, removed in piecemeal with APC ablation. Here for surveillance exam. Generally he feels well without complaints. On no Crohns medications at present, denies abdominal pain.   Past Medical History:  Diagnosis Date  . Allergy   . Cataract    been removed   . Crohn disease ([place])   . Dehydration 03/01/2013   Diarrhea due to crohn's disease.   . Diverticulosis   . GERD (gastroesophageal reflux disease)   . Glaucoma   . History of adenomatous polyp of colon   . Hyperhomocysteinemia ([place])   . Hyperlipidemia   . PONV (postoperative nausea and vomiting)    "due to lots of mucus and phelgm in throat"-all with colonoscopy and years ago with gall bladder surgery   . Prostate cancer [facility])    "watchful waiting"- rechecks every 3- 6 months- Dr. Alinda Money  . SBO (small bowel obstruction) 03/17/2014  . Small bowel obstruction   . TIA (transient ischemic attack)    HX OF   . Vitamin B12 deficiency      Past Surgical History:  Procedure Laterality Date  . APPENDECTOMY    . CATARACT EXTRACTION     both eyes  . CHOLECYSTECTOMY    . COLONOSCOPY    . COLONOSCOPY WITH PROPOFOL N/A 07/24/2015   Procedure: COLONOSCOPY WITH PROPOFOL;  Surgeon: Manus Gunning, MD;  Location: [facility];  Service: Gastroenterology;  Laterality: N/A;  . POLYPECTOMY    . terminal ileum resection    . TONSILLECTOMY    . VASECTOMY     Family History  Problem Relation Age of Onset  . Crohn's disease Mother   . Heart disease Father   . Stroke Father   . Diabetes Paternal Grandfather   . Colon cancer Neg Hx   . Esophageal cancer Neg Hx   . Rectal cancer Neg Hx   . Stomach cancer Neg Hx   . Colon polyps Neg Hx    Social History  Substance  Use Topics  . Smoking status: Former Smoker    Types: Cigarettes    Quit date: 07/16/1988  . Smokeless tobacco: Never Used  . Alcohol use 0.0 oz/week     Comment: occasionally to rare   Current Facility-Administered Medications  Medication Dose Route Frequency Provider Last Rate Last Dose  . lactated ringers infusion   Intravenous Continuous Manus Gunning, MD 20 mL/hr at 12/12/15 [phone]     No Known Allergies   Review of Systems: All systems reviewed and negative except where noted in HPI.   Lab Results  Component Value Date   WBC 7.9 05/17/2015   HGB 12.2 (L) 05/17/2015   HCT 36.6 (L) 05/17/2015   MCV 91.8 05/17/2015   PLT 242.0 05/17/2015   Lab Results  Component Value Date   CREATININE 1.25 05/17/2015   BUN 14 05/17/2015   NA 143 05/17/2015   K 3.8 05/17/2015   CL 108 05/17/2015   CO2 29 05/17/2015    Lab Results  Component Value Date   ALT 10 05/17/2015   AST 16 05/17/2015   ALKPHOS 39 05/17/2015   BILITOT 0.7 05/17/2015      Physical Exam: BP (!) 167/70   Pulse 87   Temp 98.1 F (36.7  C) (Oral)   Resp 17   Ht 6' (1.829 m)   Wt 168 lb 6.4 oz (76.4 kg)   SpO2 98%   BMI 22.84 kg/m  Constitutional: Pleasant,well-developed, male in no acute distress. Cardiovascular: Normal rate, regular rhythm.  Pulmonary/chest: Effort normal and breath sounds normal. No wheezing, rales or rhonchi. Abdominal: Soft, nondistended, nontender. There are no masses palpable.  Extremities: no edema   ASSESSMENT AND PLAN: 81 y/o male with Crohns disease, currently on no therapy and doing well, here for a surveillance colonoscopy for follow up of large right sided polypectomy, previously treated as above. Discussed risks / benefits of colonoscopy with him to include bleeding / perforation, and he wished to proceed. Further recommendations pending the results.   [place] Cellar, MD [facility] Gastroenterology Pager [phone]

## 2015-12-12 NOTE — Op Note (Signed)
[facility] Patient Name: Ricardo Villa Procedure Date: 12/12/2015 MRN: [id number] Attending MD: Carlota Raspberry. Havery Moros , MD Date of Birth: Aug 06, 1934 CSN: [id number] Age: 81 Admit Type: Outpatient Procedure:                Colonoscopy Indications:              High risk colon cancer surveillance: Personal                            history of colonic polyps - large adenoma removed                            in piecemeal fashion in May (fair prep at that                            time). History of Crohns, currently asymptomatic,                            not on therapy Providers:                Carlota Raspberry. Havery Moros, MD, Kingsley Plan, RN, Alfonso Patten, Technician, Marla Roe, CRNA Referring MD:              Medicines:                Monitored Anesthesia Care Complications:            No immediate complications. Estimated blood loss:                            Minimal. Estimated Blood Loss:     Estimated blood loss was minimal. Procedure:                Pre-Anesthesia Assessment:                           - Prior to the procedure, a History and Physical                            was performed, and patient medications and                            allergies were reviewed. The patient's tolerance of                            previous anesthesia was also reviewed. The risks                            and benefits of the procedure and the sedation                            options and risks were discussed with the patient.  All questions were answered, and informed consent                            was obtained. Prior Anticoagulants: The patient has                            taken aspirin, last dose was 1 day prior to                            procedure. ASA Grade Assessment: II - A patient                            with mild systemic disease. After reviewing the                            risks and  benefits, the patient was deemed in                            satisfactory condition to undergo the procedure.                           After obtaining informed consent, the colonoscope                            was passed under direct vision. Throughout the                            procedure, the patient's blood pressure, pulse, and                            oxygen saturations were monitored continuously. The                            EC-[id number] ([id number]) scope was introduced through                            the anus and advanced to the the ileocolonic                            anastomosis. The colonoscopy was performed without                            difficulty. The patient tolerated the procedure                            well. The quality of the bowel preparation was                            good. The terminal ileum and the rectum, surgical                            anastomosis were photographed. Scope In: 8:31:03 AM Scope Out: 9:25:02 AM Scope Withdrawal Time: 0 hours 14 minutes 35 seconds  Total Procedure Duration: 0 hours 53 minutes 59 seconds  Findings:      The perianal and digital rectal examinations were normal.      There was evidence of a prior end-to-side ileo-colonic anastomosis in       the ascending colon. This was patent and ileal orifice was appreciated,       and characterized by a stenosis without any significant inflammatory       changes appreciated. The anastomosis was not able to be traversed, nor       the length of the stenosis was able to be appreciated.      A suspected 10 mm polyp versus nodular small bowel tissue was found at       the anastomosis in the right colon. This area was previously not well       visualized due to fair bowel prep. The lesion was sessile. The polyp was       removed with a piecemeal technique using a cold snare. Resection and       retrieval were complete. Area was tattooed with an injection of Spot       (carbon  black).      A large post polypectomy scar was found at the hepatic flexure at the       site of most recent polypoid tissue. In the center of it, there was a       small amount of mildly nodular tissue at the scar, suspected residual       polyp tissue versus scar tissue. The base of the scar was removed with       cold snare in piecemeal at the site of iiregular appearing tissue, in       which multiple passes were made due to scar tissue at the site, and then       fulguration to ablate the base of the lesion by argon plasma was       performed and successful.      Non-bleeding internal hemorrhoids were found during retroflexion. The       hemorrhoids were moderate.      The exam was otherwise without abnormality. No inflammatory changes       appreciated. Impression:               - Patent end-to-side ileo-colonic anastomosis,                            characterized by a fibrotic stenosis, not able to                            be traversed, no obvious inflammatory changes noted.                           - One suspected 10 mm polyp versus nodular ileal                            tissue at the anastomosis, removed in piecemeal                            using a cold snare. Resected and retrieved.  Tattooed.                           - Post-polypectomy scar at the hepatic flexure.                            Small amount of irregular tissue concerning for                            residual polypoid tissue vs. scar tissue at the                            base. Multiple passes with stiff snare used to                            remove tissue at the base and then treated with                            argon plasma coagulation (APC).                           - Non-bleeding internal hemorrhoids.                           - The examination was otherwise normal. Moderate Sedation:      No moderate sedation, case performed with MAC Recommendation:           -  Patient has a contact number available for                            emergencies. The signs and symptoms of potential                            delayed complications were discussed with the                            patient. Return to normal activities tomorrow.                            Written discharge instructions were provided to the                            patient.                           - Resume previous diet.                           - Continue present medications.                           - No ibuprofen, naproxen, or other non-steroidal                            anti-inflammatory drugs for 2 weeks after polyp  removal.                           - Await pathology results.                           - Repeat colonoscopy is recommended for                            surveillance in 6 months. Procedure Code(s):        --- Professional ---                           [phone], Colonoscopy, flexible; with ablation of                            tumor(s), polyp(s), or other lesion(s) (includes                            pre- and post-dilation and guide wire passage, when                            performed)                           45385, 59, Colonoscopy, flexible; with removal of                            tumor(s), polyp(s), or other lesion(s) by snare                            technique                           45381, Colonoscopy, flexible; with directed                            submucosal injection(s), any substance Diagnosis Code(s):        --- Professional ---                           Z86.010, Personal history of colonic polyps                           Z98.0, Intestinal bypass and anastomosis status                           D12.6, Benign neoplasm of colon, unspecified                           Z98.890, Other specified postprocedural states                           K64.8, Other hemorrhoids CPT copyright 2016 American Medical Association.  All rights reserved. The codes documented in this report are preliminary and upon coder review may  be revised to meet current compliance requirements. Remo Lipps P. Luana Tatro, MD 12/12/2015 9:44:16 AM This report has  been signed electronically. Number of Addenda: 0

## 2015-12-12 NOTE — Interval H&P Note (Signed)
History and Physical Interval Note:  12/12/2015 8:18 AM  Ricardo Villa  has presented today for surgery, with the diagnosis of f/u on polyp  The various methods of treatment have been discussed with the patient and family. After consideration of risks, benefits and other options for treatment, the patient has consented to  Procedure(s): COLONOSCOPY WITH PROPOFOL (N/A) HOT HEMOSTASIS (ARGON PLASMA COAGULATION/BICAP) (N/A) as a surgical intervention .  The patient's history has been reviewed, patient examined, no change in status, stable for surgery.  I have reviewed the patient's chart and labs.  Questions were answered to the patient's satisfaction.     Ricardo Villa

## 2015-12-12 NOTE — Anesthesia Postprocedure Evaluation (Addendum)
Anesthesia Post Note  Patient: ARIZONA NORDQUIST  Procedure(s) Performed: Procedure(s) (LRB): COLONOSCOPY WITH PROPOFOL (N/A) HOT HEMOSTASIS (ARGON PLASMA COAGULATION/BICAP) (N/A)  Patient location during evaluation: PACU Anesthesia Type: MAC Level of consciousness: awake and alert Pain management: pain level controlled Vital Signs Assessment: post-procedure vital signs reviewed and stable Respiratory status: spontaneous breathing, nonlabored ventilation and respiratory function stable Cardiovascular status: stable and blood pressure returned to baseline Anesthetic complications: no    Last Vitals:  Vitals:   12/12/15 0730 12/12/15 0931  BP: (!) 167/70 (!) 116/49  Pulse: 87 71  Resp: 17 16  Temp: 36.7 C 36.4 C    Last Pain:  Vitals:   12/12/15 0931  TempSrc: Oral                 Nilda Simmer

## 2015-12-13 ENCOUNTER — Encounter (HOSPITAL_COMMUNITY): Payer: Self-pay | Admitting: Gastroenterology

## 2015-12-14 ENCOUNTER — Encounter: Payer: Self-pay | Admitting: Gastroenterology

## 2015-12-14 ENCOUNTER — Other Ambulatory Visit: Payer: Self-pay

## 2015-12-14 DIAGNOSIS — K50919 Crohn's disease, unspecified, with unspecified complications: Secondary | ICD-10-CM

## 2015-12-19 ENCOUNTER — Other Ambulatory Visit: Payer: Self-pay

## 2015-12-22 ENCOUNTER — Other Ambulatory Visit: Payer: Self-pay

## 2016-01-08 ENCOUNTER — Other Ambulatory Visit (INDEPENDENT_AMBULATORY_CARE_PROVIDER_SITE_OTHER): Payer: Medicare Other

## 2016-01-08 ENCOUNTER — Encounter: Payer: Self-pay | Admitting: Gastroenterology

## 2016-01-08 DIAGNOSIS — K50919 Crohn's disease, unspecified, with unspecified complications: Secondary | ICD-10-CM

## 2016-01-08 LAB — CBC WITH DIFFERENTIAL/PLATELET
BASOS ABS: 0 10*3/uL (ref 0.0–0.1)
Basophils Relative: 0.6 % (ref 0.0–3.0)
Eosinophils Absolute: 0.1 10*3/uL (ref 0.0–0.7)
Eosinophils Relative: 1 % (ref 0.0–5.0)
HCT: 39.2 % (ref 39.0–52.0)
Hemoglobin: 13.3 g/dL (ref 13.0–17.0)
LYMPHS ABS: 1.7 10*3/uL (ref 0.7–4.0)
Lymphocytes Relative: 22.3 % (ref 12.0–46.0)
MCHC: 33.8 g/dL (ref 30.0–36.0)
MCV: 86.5 fl (ref 78.0–100.0)
MONO ABS: 0.7 10*3/uL (ref 0.1–1.0)
Monocytes Relative: 9.9 % (ref 3.0–12.0)
NEUTROS ABS: 5 10*3/uL (ref 1.4–7.7)
NEUTROS PCT: 66.2 % (ref 43.0–77.0)
PLATELETS: 213 10*3/uL (ref 150.0–400.0)
RBC: 4.53 Mil/uL (ref 4.22–5.81)
RDW: 14 % (ref 11.5–15.5)
WBC: 7.5 10*3/uL (ref 4.0–10.5)

## 2016-01-08 LAB — COMPREHENSIVE METABOLIC PANEL
ALK PHOS: 48 U/L (ref 39–117)
ALT: 10 U/L (ref 0–53)
AST: 19 U/L (ref 0–37)
Albumin: 3.4 g/dL — ABNORMAL LOW (ref 3.5–5.2)
BUN: 10 mg/dL (ref 6–23)
CO2: 30 meq/L (ref 19–32)
Calcium: 8.9 mg/dL (ref 8.4–10.5)
Chloride: 107 mEq/L (ref 96–112)
Creatinine, Ser: 1.28 mg/dL (ref 0.40–1.50)
GFR: 57.51 mL/min — ABNORMAL LOW (ref >60.00)
GLUCOSE: 88 mg/dL (ref 70–99)
POTASSIUM: 3.6 meq/L (ref 3.5–5.1)
SODIUM: 144 meq/L (ref 135–145)
TOTAL PROTEIN: 6 g/dL (ref 6.0–8.3)
Total Bilirubin: 0.9 mg/dL (ref 0.2–1.2)

## 2016-01-08 LAB — C-REACTIVE PROTEIN: CRP: 0.1 mg/dL — ABNORMAL LOW (ref 0.5–20.0)

## 2016-01-08 LAB — SEDIMENTATION RATE: Sed Rate: 6 mm/hr (ref 0–20)

## 2016-01-24 NOTE — Progress Notes (Signed)
Letter mailed

## 2016-02-12 ENCOUNTER — Ambulatory Visit (INDEPENDENT_AMBULATORY_CARE_PROVIDER_SITE_OTHER): Payer: Medicare Other | Admitting: Gastroenterology

## 2016-02-12 ENCOUNTER — Encounter: Payer: Self-pay | Admitting: Gastroenterology

## 2016-02-12 VITALS — BP 116/72 | HR 84 | Ht 72.0 in | Wt 169.4 lb

## 2016-02-12 DIAGNOSIS — K50018 Crohn's disease of small intestine with other complication: Secondary | ICD-10-CM

## 2016-02-12 DIAGNOSIS — Z86010 Personal history of colonic polyps: Secondary | ICD-10-CM

## 2016-02-12 NOTE — Progress Notes (Signed)
HPI :  Crohns history: 81 y/o male with a longstanding history of stricturing ileal Crohns disease. He also has a remote history of prostate cancer in remission, and TIA. He was previously followed by Dr. Olevia Perches. He was diagnosed when he was in his early 14s. He has had 2 prior small bowel resections (ileocectomy and ileal resection per patient, in [place]). He has been hospitalized in 2014 and December 2016 for bowel obstructions which resolved with bowel rest. He has been on prednisone over the years intermittently. He had been on 6MP for several years at low dose, this was stopped earlier this year in 2017.   Colonoscopy 12/2012 - he had a large tubulovilloous adenoma and another adenoma, one in ascending and one in sigmoid, not entirely removed given and attempted to be removed in piecemeal per Dr. Darra Lis LAST VISIT:  Patient has had 3 colonoscopies since March as outlined below. He had large adenomatous polyp attempted to be removed in 2014 but not successful and he had not had a follow up exam in 3 years. This lesion was identified on initial colonoscopy in March but not removed while in [place] given the size of the lesion. He had a large 4cm polyp, scarred down from initial polypectomy attempt. I believe mostly all of this has been removed on his last 2 exams, as outlined below.   He otherwise has had no evidence of active Crohns and 6MP was stopped. He reports feeling well without abdominal pains after he eats. He reports having roughly 1-2 BMs per day. No blood in the stools. His flu shot is up to date. He states he is UTD with flut shot and pneumonia vaccine. Weight is stable.   Colonoscopy 05/31/15 - 4 cm right sided polyp (not attempted to be removed), nodularity at anastomosis - bx show TVA Colonoscopy 07/24/15 - EMR of large right sided polyp, fair prep Colonoscopy 12/12/15 - stenosis of surgical anastomosis but no inflammatory changes, 1cm anastomotic polyp removed path c/w  adenoma, small area of residual polyp at EMR site removed with snare and treated with APC, path c/w adenoma    Past Medical History:  Diagnosis Date  . Allergy   . Cataract    been removed   . Crohn disease ([place])   . Dehydration 03/01/2013   Diarrhea due to crohn's disease.   . Diverticulosis   . GERD (gastroesophageal reflux disease)   . Glaucoma   . History of adenomatous polyp of colon   . Hyperhomocysteinemia ([place])   . Hyperlipidemia   . PONV (postoperative nausea and vomiting)    "due to lots of mucus and phelgm in throat"-all with colonoscopy and years ago with gall bladder surgery   . Prostate cancer [facility])    "watchful waiting"- rechecks every 3- 6 months- Dr. Alinda Money  . SBO (small bowel obstruction) 03/17/2014  . Small bowel obstruction   . TIA (transient ischemic attack)    HX OF   . Vitamin B12 deficiency      Past Surgical History:  Procedure Laterality Date  . APPENDECTOMY    . CATARACT EXTRACTION     both eyes  . CHOLECYSTECTOMY    . COLONOSCOPY    . COLONOSCOPY WITH PROPOFOL N/A 07/24/2015   Procedure: COLONOSCOPY WITH PROPOFOL;  Surgeon: Manus Gunning, MD;  Location: [facility] END[facility]COPY;  Service: Gastroenterology;  Laterality: N/A;  . COLONOSCOPY WITH PROPOFOL N/A 12/12/2015   Procedure: COLONOSCOPY WITH PROPOFOL;  Surgeon: Manus Gunning, MD;  Location: [facility];  Service: Gastroenterology;  Laterality: N/A;  . HOT HEMOSTASIS N/A 12/12/2015   Procedure: HOT HEMOSTASIS (ARGON PLASMA COAGULATION/BICAP);  Surgeon: Manus Gunning, MD;  Location: [facility];  Service: Gastroenterology;  Laterality: N/A;  . POLYPECTOMY    . terminal ileum resection    . TONSILLECTOMY    . VASECTOMY     Family History  Problem Relation Age of Onset  . Crohn's disease Mother   . Heart disease Father   . Stroke Father   . Diabetes Paternal Grandfather   . Colon cancer Neg Hx   . Esophageal cancer Neg Hx   . Rectal cancer Neg Hx   . Stomach cancer  Neg Hx   . Colon polyps Neg Hx    Social History  Substance Use Topics  . Smoking status: Former Smoker    Types: Cigarettes    Quit date: 07/16/1988  . Smokeless tobacco: Never Used  . Alcohol use 0.0 oz/week     Comment: occasionally to rare   Current Outpatient Prescriptions  Medication Sig Dispense Refill  . aspirin EC 81 MG tablet Take 81 mg by mouth daily.    . B-12, Methylcobalamin, 1000 MCG SUBL Place 1,000 mcg under the tongue daily.     . calcium carbonate (TUMS - DOSED IN MG ELEMENTAL CALCIUM) 500 MG chewable tablet Chew 1 tablet by mouth as needed for indigestion or heartburn.    . diphenoxylate-atropine (LOMOTIL) 2.5-0.025 MG tablet TAKE ONE TABLET 1 OR 2 TIMES DAILY AS NEEDED FOR DIARRHEA OR LOOSE STOOLS. 60 tablet 2   No current facility-administered medications for this visit.    No Known Allergies   Review of Systems: All systems reviewed and negative except where noted in HPI.   Lab Results  Component Value Date   WBC 7.5 01/08/2016   HGB 13.3 01/08/2016   HCT 39.2 01/08/2016   MCV 86.5 01/08/2016   PLT 213.0 01/08/2016    Lab Results  Component Value Date   CREATININE 1.28 01/08/2016   BUN 10 01/08/2016   NA 144 01/08/2016   K 3.6 01/08/2016   CL 107 01/08/2016   CO2 30 01/08/2016    Lab Results  Component Value Date   ALT 10 01/08/2016   AST 19 01/08/2016   ALKPHOS 48 01/08/2016   BILITOT 0.9 01/08/2016    Lab Results  Component Value Date   CRP 0.1 (L) 01/08/2016    Lab Results  Component Value Date   ESRSEDRATE 6 01/08/2016      Physical Exam: BP 116/72   Pulse 84   Ht 6' (1.829 m)   Wt 169 lb 6 oz (76.8 kg)   BMI 22.97 kg/m  Constitutional: Pleasant,well-developed, male in no acute distress. HEENT: Normocephalic and atraumatic. Conjunctivae are normal. No scleral icterus. Neck supple.  Cardiovascular: Normal rate, regular rhythm.  Pulmonary/chest: Effort normal and breath sounds normal. No wheezing, rales or  rhonchi. Abdominal: Soft, nondistended, nontender.  There are no masses palpable.  Extremities: no edema Lymphadenopathy: No cervical adenopathy noted. Neurological: Alert and oriented to person place and time. Skin: Skin is warm and dry. No rashes noted. Psychiatric: Normal mood and affect. Behavior is normal.   ASSESSMENT AND PLAN: 81 year old male with history as outlined above, here for reassessment of the following issues:  Crohn's disease - historically with ileal disease with history obstructions as above. He had been on a very low-dose of 6-MP long-standing. I stopped this when I met him as I don't  think he was getting much benefit from subtherapeutic 6-TG levels while being exposed to the risks of the drug. On his follow-up colonoscopies he has been noted to have a fibrotic stricture at the site of surgical anastomosis, but no inflammatory changes. His most recent set of labs is normal. He is asymptomatic. I don't think he has any active Crohn's at this time. We have previously discussed therapies for his ileal Crohn's disease and given he is been doing well without active inflammation we have chose to monitor. He is maintaining a low residue diet. If he has further obstructions in the future I think treatment of his disease with mainly be surgical. He should have labs drawn at least yearly and follow-up with me every 6-12 months for this issue. Vaccinations up to date.  Colon adenomas - large 4 cm right-sided adenoma, scarred down from polypectomy attempt in 2014. Due to this issue he has had 2 colonoscopies for EMR and I do think this lesion has mostly been removed, although given technical difficultly there is a possibility for small focus of residual polyp after his last therapy, and I had offered him a colonoscopy to be done again in 6 months from his last exam. He's had a lot of exams in the past year and wishes to think about this. He may consider it within the next year or so. He will  see me again in the spring to discuss this further.   Coppock Cellar, MD [facility] Pager [phone]

## 2016-02-12 NOTE — Patient Instructions (Signed)
If you are age 79 or older, your body mass index should be between 23-30. Your Body mass index is 22.97 kg/m. If this is out of the aforementioned range listed, please consider follow up with your Primary Care Provider.  If you are age 76 or younger, your body mass index should be between 19-25. Your Body mass index is 22.97 kg/m. If this is out of the aformentioned range listed, please consider follow up with your Primary Care Provider.   Please follow up with Dr Havery Moros in 4-5 months.  Thank you!

## 2016-04-14 ENCOUNTER — Emergency Department (HOSPITAL_COMMUNITY): Payer: Medicare Other

## 2016-04-14 ENCOUNTER — Observation Stay (HOSPITAL_COMMUNITY)
Admission: EM | Admit: 2016-04-14 | Discharge: 2016-04-15 | Disposition: A | Discharge status: Home / Self Care | Payer: Medicare Other | Attending: Internal Medicine | Admitting: Internal Medicine

## 2016-04-14 ENCOUNTER — Encounter (HOSPITAL_COMMUNITY): Payer: Self-pay | Admitting: Oncology

## 2016-04-14 DIAGNOSIS — H409 Unspecified glaucoma: Secondary | ICD-10-CM | POA: Insufficient documentation

## 2016-04-14 DIAGNOSIS — K50812 Crohn's disease of both small and large intestine with intestinal obstruction: Secondary | ICD-10-CM | POA: Diagnosis not present

## 2016-04-14 DIAGNOSIS — N200 Calculus of kidney: Secondary | ICD-10-CM | POA: Diagnosis not present

## 2016-04-14 DIAGNOSIS — D72829 Elevated white blood cell count, unspecified: Secondary | ICD-10-CM | POA: Diagnosis not present

## 2016-04-14 DIAGNOSIS — E785 Hyperlipidemia, unspecified: Secondary | ICD-10-CM | POA: Diagnosis not present

## 2016-04-14 DIAGNOSIS — E7211 Homocystinuria: Secondary | ICD-10-CM | POA: Diagnosis not present

## 2016-04-14 DIAGNOSIS — E538 Deficiency of other specified B group vitamins: Secondary | ICD-10-CM | POA: Insufficient documentation

## 2016-04-14 DIAGNOSIS — Z8379 Family history of other diseases of the digestive system: Secondary | ICD-10-CM | POA: Insufficient documentation

## 2016-04-14 DIAGNOSIS — K219 Gastro-esophageal reflux disease without esophagitis: Secondary | ICD-10-CM | POA: Diagnosis not present

## 2016-04-14 DIAGNOSIS — N281 Cyst of kidney, acquired: Secondary | ICD-10-CM | POA: Insufficient documentation

## 2016-04-14 DIAGNOSIS — K5090 Crohn's disease, unspecified, without complications: Secondary | ICD-10-CM | POA: Diagnosis present

## 2016-04-14 DIAGNOSIS — Z87891 Personal history of nicotine dependence: Secondary | ICD-10-CM | POA: Diagnosis not present

## 2016-04-14 DIAGNOSIS — Z9852 Vasectomy status: Secondary | ICD-10-CM | POA: Insufficient documentation

## 2016-04-14 DIAGNOSIS — Z8546 Personal history of malignant neoplasm of prostate: Secondary | ICD-10-CM | POA: Insufficient documentation

## 2016-04-14 DIAGNOSIS — I700 Atherosclerosis of aorta: Secondary | ICD-10-CM | POA: Insufficient documentation

## 2016-04-14 DIAGNOSIS — Z79899 Other long term (current) drug therapy: Secondary | ICD-10-CM | POA: Insufficient documentation

## 2016-04-14 DIAGNOSIS — E870 Hyperosmolality and hypernatremia: Secondary | ICD-10-CM | POA: Diagnosis not present

## 2016-04-14 DIAGNOSIS — E876 Hypokalemia: Secondary | ICD-10-CM | POA: Diagnosis not present

## 2016-04-14 DIAGNOSIS — Z9841 Cataract extraction status, right eye: Secondary | ICD-10-CM | POA: Diagnosis not present

## 2016-04-14 DIAGNOSIS — K56609 Unspecified intestinal obstruction, unspecified as to partial versus complete obstruction: Secondary | ICD-10-CM

## 2016-04-14 DIAGNOSIS — Z823 Family history of stroke: Secondary | ICD-10-CM | POA: Insufficient documentation

## 2016-04-14 DIAGNOSIS — Z9842 Cataract extraction status, left eye: Secondary | ICD-10-CM | POA: Insufficient documentation

## 2016-04-14 DIAGNOSIS — Z7982 Long term (current) use of aspirin: Secondary | ICD-10-CM | POA: Diagnosis not present

## 2016-04-14 DIAGNOSIS — N182 Chronic kidney disease, stage 2 (mild): Secondary | ICD-10-CM | POA: Diagnosis not present

## 2016-04-14 DIAGNOSIS — E1122 Type 2 diabetes mellitus with diabetic chronic kidney disease: Secondary | ICD-10-CM | POA: Diagnosis not present

## 2016-04-14 DIAGNOSIS — K5790 Diverticulosis of intestine, part unspecified, without perforation or abscess without bleeding: Secondary | ICD-10-CM | POA: Diagnosis not present

## 2016-04-14 DIAGNOSIS — Z8673 Personal history of transient ischemic attack (TIA), and cerebral infarction without residual deficits: Secondary | ICD-10-CM | POA: Diagnosis not present

## 2016-04-14 DIAGNOSIS — Z86010 Personal history of colonic polyps: Secondary | ICD-10-CM | POA: Insufficient documentation

## 2016-04-14 DIAGNOSIS — K449 Diaphragmatic hernia without obstruction or gangrene: Secondary | ICD-10-CM | POA: Diagnosis not present

## 2016-04-14 DIAGNOSIS — Z8249 Family history of ischemic heart disease and other diseases of the circulatory system: Secondary | ICD-10-CM | POA: Insufficient documentation

## 2016-04-14 HISTORY — DX: Cerebral infarction, unspecified: I63.9

## 2016-04-14 LAB — COMPREHENSIVE METABOLIC PANEL
ALT: 23 U/L (ref 17–63)
ANION GAP: 8 (ref 5–15)
AST: 35 U/L (ref 15–41)
Albumin: 3.6 g/dL (ref 3.5–5.0)
Alkaline Phosphatase: 50 U/L (ref 38–126)
BUN: 19 mg/dL (ref 6–20)
CHLORIDE: 106 mmol/L (ref 101–111)
CO2: 29 mmol/L (ref 22–32)
CREATININE: 1.52 mg/dL — AB (ref 0.61–1.24)
Calcium: 9.1 mg/dL (ref 8.9–10.3)
GFR, EST AFRICAN AMERICAN: 48 mL/min — AB (ref >60)
GFR, EST NON AFRICAN AMERICAN: 42 mL/min — AB (ref >60)
Glucose, Bld: 157 mg/dL — ABNORMAL HIGH (ref 65–99)
POTASSIUM: 4.1 mmol/L (ref 3.5–5.1)
SODIUM: 143 mmol/L (ref 135–145)
Total Bilirubin: 1.2 mg/dL (ref 0.3–1.2)
Total Protein: 6.4 g/dL — ABNORMAL LOW (ref 6.5–8.1)

## 2016-04-14 LAB — CBC
HEMATOCRIT: 43.2 % (ref 39.0–52.0)
HEMOGLOBIN: 14.4 g/dL (ref 13.0–17.0)
MCH: 28.8 pg (ref 26.0–34.0)
MCHC: 33.3 g/dL (ref 30.0–36.0)
MCV: 86.4 fL (ref 78.0–100.0)
PLATELETS: 222 10*3/uL (ref 150–400)
RBC: 5 MIL/uL (ref 4.22–5.81)
RDW: 13.5 % (ref 11.5–15.5)
WBC: 18.4 10*3/uL — AB (ref 4.0–10.5)

## 2016-04-14 LAB — URINALYSIS, ROUTINE W REFLEX MICROSCOPIC
Bilirubin Urine: NEGATIVE
GLUCOSE, UA: NEGATIVE mg/dL
HGB URINE DIPSTICK: NEGATIVE
Ketones, ur: 5 mg/dL — AB
Leukocytes, UA: NEGATIVE
Nitrite: NEGATIVE
PROTEIN: NEGATIVE mg/dL
SPECIFIC GRAVITY, URINE: 1.028 (ref 1.005–1.030)
pH: 5 (ref 5.0–8.0)

## 2016-04-14 LAB — LIPASE, BLOOD: LIPASE: 21 U/L (ref 11–51)

## 2016-04-14 MED ORDER — MORPHINE SULFATE (PF) 2 MG/ML IV SOLN: 1.0000 mg | INTRAVENOUS | Status: DC | PRN

## 2016-04-14 MED ORDER — ACETAMINOPHEN 650 MG RE SUPP: 650.0000 mg | Freq: Four times a day (QID) | RECTAL | Status: DC | PRN

## 2016-04-14 MED ORDER — ACETAMINOPHEN 325 MG PO TABS: 650.0000 mg | ORAL_TABLET | Freq: Four times a day (QID) | ORAL | Status: DC | PRN

## 2016-04-14 MED ORDER — ENOXAPARIN SODIUM 40 MG/0.4ML ~~LOC~~ SOLN
40.0000 mg | SUBCUTANEOUS | Status: DC
  Administered 2016-04-14 – 2016-04-15 (×2): 40 mg via SUBCUTANEOUS
  Filled 2016-04-14 (×2): qty 0.4

## 2016-04-14 MED ORDER — VITAMIN B-12 1000 MCG PO TABS
1000.0000 ug | ORAL_TABLET | Freq: Every day | ORAL | Status: DC
Start: 2016-04-14 — End: 2016-04-15
  Administered 2016-04-14 – 2016-04-15 (×2): 1000 ug via ORAL
  Filled 2016-04-14 (×2): qty 1

## 2016-04-14 MED ORDER — DM-GUAIFENESIN ER 30-600 MG PO TB12
1.0000 | ORAL_TABLET | Freq: Two times a day (BID) | ORAL | Status: DC
  Administered 2016-04-14 – 2016-04-15 (×2): 1 via ORAL
  Filled 2016-04-14 (×3): qty 1

## 2016-04-14 MED ORDER — SODIUM CHLORIDE 0.9 % IV SOLN
INTRAVENOUS | Status: DC
  Administered 2016-04-14: 05:00:00 via INTRAVENOUS

## 2016-04-14 MED ORDER — IOPAMIDOL (ISOVUE-300) INJECTION 61%
100.0000 mL | Freq: Once | INTRAVENOUS | Status: AC | PRN
Start: 2016-04-14 — End: 2016-04-14
  Administered 2016-04-14: 100 mL via INTRAVENOUS

## 2016-04-14 MED ORDER — SODIUM CHLORIDE 0.9 % IV BOLUS (SEPSIS)
1000.0000 mL | Freq: Once | INTRAVENOUS | Status: AC
  Administered 2016-04-14: 1000 mL via INTRAVENOUS

## 2016-04-14 MED ORDER — ONDANSETRON HCL 4 MG/2ML IJ SOLN
4.0000 mg | Freq: Four times a day (QID) | INTRAMUSCULAR | Status: DC | PRN
  Administered 2016-04-14: 4 mg via INTRAVENOUS

## 2016-04-14 MED ORDER — SODIUM CHLORIDE 0.9 % IV SOLN: INTRAVENOUS | Status: DC

## 2016-04-14 MED ORDER — IOPAMIDOL (ISOVUE-300) INJECTION 61%
INTRAVENOUS | Status: AC
  Filled 2016-04-14: qty 100

## 2016-04-14 MED ORDER — SODIUM CHLORIDE 0.9 % IV SOLN
INTRAVENOUS | Status: DC
  Administered 2016-04-14 – 2016-04-15 (×2): via INTRAVENOUS

## 2016-04-14 MED ORDER — ONDANSETRON HCL 4 MG PO TABS: 4.0000 mg | ORAL_TABLET | Freq: Four times a day (QID) | ORAL | Status: DC | PRN

## 2016-04-14 MED ORDER — ONDANSETRON HCL 4 MG/2ML IJ SOLN
INTRAMUSCULAR | Status: AC
  Filled 2016-04-14: qty 2

## 2016-04-14 NOTE — Progress Notes (Signed)
Dr. Maylene Roes aware via text pt c/o congested, nagging cough with small amounts of white sputum noted. See new orders entered into EPIC by MD.

## 2016-04-14 NOTE — H&P (Signed)
History and Physical    Ricardo Villa XIP:[id number] DOB: July 04, 1934 DOA: 04/14/2016  PCP: [name], MD   Patient coming from: Home  Chief Complaint: Abdominal pain, N/V  HPI: Ricardo Villa is a 82 y.o. male with medical history significant for Crohn's disease with ileal and cecal resections in the 1980s and 1990s, now presenting to the emergency department with 1 day of abdominal pain, nausea, and vomiting. Patient reports that he had been in his usual state of health with no signs of active Crohn's disease in the past couple years until last night after eating some leftovers, when he developed lower abdominal pain with nausea and nonbloody nonbilious vomiting. Patient describes the pain as intermittent, cramping, localized to the lower quadrants, worse with any oral intake, and slightly better with vomiting. Patient denies any fevers or chills and cough or dyspnea. He has had bowel obstructions in 2014 and 2016 which resolved with conservative management. He had been on a low dose 6-MP until it was stopped last year.  ED Course: Upon arrival to the ED, patient is found to be afebrile, saturating well on room air, mildly tachycardic, with vitals otherwise stable. Chemistry panels notable for serum creatinine of 1.52, up from an apparent baseline of 1.3. CBC is notable for a leukocytosis to 18,400 and urinalysis is unremarkable. CT of the abdomen and pelvis was obtained and findings demonstrated a small bowel obstruction with transition point at the stricture within the distal ileum. Patient was given 1 L of normal saline in the emergency department with resolution of his tachycardia. He remained hemodynamically stable and in no apparent respiratory distress and will be observed on the medical/surgical unit for ongoing evaluation and management of small bowel obstruction.  Review of Systems:  All other systems reviewed and apart from HPI, are negative.  Past Medical History:   Diagnosis Date  . Allergy   . Cataract    been removed   . Crohn disease ([place])   . Dehydration 03/01/2013   Diarrhea due to crohn's disease.   . Diverticulosis   . GERD (gastroesophageal reflux disease)   . Glaucoma   . History of adenomatous polyp of colon   . Hyperhomocysteinemia ([place])   . Hyperlipidemia   . PONV (postoperative nausea and vomiting)    "due to lots of mucus and phelgm in throat"-all with colonoscopy and years ago with gall bladder surgery   . Prostate cancer [facility])    "watchful waiting"- rechecks every 3- 6 months- Dr. Alinda Money  . SBO (small bowel obstruction) 03/17/2014  . Small bowel obstruction   . TIA (transient ischemic attack)    HX OF   . Vitamin B12 deficiency     Past Surgical History:  Procedure Laterality Date  . APPENDECTOMY    . CATARACT EXTRACTION     both eyes  . CHOLECYSTECTOMY    . COLONOSCOPY    . COLONOSCOPY WITH PROPOFOL N/A 07/24/2015   Procedure: COLONOSCOPY WITH PROPOFOL;  Surgeon: Manus Gunning, MD;  Location: [facility]COPY;  Service: Gastroenterology;  Laterality: N/A;  . COLONOSCOPY WITH PROPOFOL N/A 12/12/2015   Procedure: COLONOSCOPY WITH PROPOFOL;  Surgeon: Manus Gunning, MD;  Location: [facility];  Service: Gastroenterology;  Laterality: N/A;  . HOT HEMOSTASIS N/A 12/12/2015   Procedure: HOT HEMOSTASIS (ARGON PLASMA COAGULATION/BICAP);  Surgeon: Manus Gunning, MD;  Location: [facility];  Service: Gastroenterology;  Laterality: N/A;  . POLYPECTOMY    . terminal ileum resection    . TONSILLECTOMY    .  VASECTOMY       reports that he quit smoking about 27 years ago. His smoking use included Cigarettes. He has never used smokeless tobacco. He reports that he drinks alcohol. He reports that he does not use drugs.  No Known Allergies  Family History  Problem Relation Age of Onset  . Crohn's disease Mother   . Heart disease Father   . Stroke Father   . Diabetes Paternal Grandfather   . Colon cancer  Neg Hx   . Esophageal cancer Neg Hx   . Rectal cancer Neg Hx   . Stomach cancer Neg Hx   . Colon polyps Neg Hx      Prior to Admission medications   Medication Sig Start Date End Date Taking? Authorizing Provider  aspirin EC 81 MG tablet Take 81 mg by mouth daily.   Yes Historical Provider, MD  B-12, Methylcobalamin, 1000 MCG SUBL Place 1,000 mcg under the tongue daily.    Yes Historical Provider, MD  calcium carbonate (TUMS - DOSED IN MG ELEMENTAL CALCIUM) 500 MG chewable tablet Chew 1 tablet by mouth daily as needed for indigestion or heartburn.    Yes Historical Provider, MD  diphenoxylate-atropine (LOMOTIL) 2.5-0.025 MG tablet TAKE ONE TABLET 1 OR 2 TIMES DAILY AS NEEDED FOR DIARRHEA OR LOOSE STOOLS. 11/16/15  Yes Manus Gunning, MD    Physical Exam: Vitals:   04/14/16 0228 04/14/16 0259 04/14/16 0333 04/14/16 0406  BP: 137/71 142/69 136/77 140/84  Pulse: 105 97 106 111  Resp: 18 18 18 18   Temp:      TempSrc:      SpO2: 97% 97% 97% 97%      Constitutional: NAD, calm, comfortable Eyes: PERTLA, lids and conjunctivae normal ENMT: Mucous membranes are moist. Posterior pharynx clear of any exudate or lesions.   Neck: normal, supple, no masses, no thyromegaly Respiratory: clear to auscultation bilaterally, no wheezing, no crackles. Normal respiratory effort.   Cardiovascular: S1 & S2 heard, regular rate and rhythm. No significant JVD. Abdomen: Mild distension, soft, mild tenderness in lower quadrants, no masses palpated. Bowel sounds are active.  Musculoskeletal: no clubbing / cyanosis. No joint deformity upper and lower extremities. Normal muscle tone.  Skin: no significant rashes, lesions, ulcers. Warm, dry, well-perfused. Neurologic: CN 2-12 grossly intact. Sensation intact, DTR normal. Strength 5/5 in all 4 limbs.  Psychiatric: Normal judgment and insight. Alert and oriented x 3. Normal mood and affect.     Labs on Admission: I have personally reviewed following  labs and imaging studies  CBC:  Recent Labs Lab 04/14/16 0218  WBC 18.4*  HGB 14.4  HCT 43.2  MCV 86.4  PLT 212   Basic Metabolic Panel:  Recent Labs Lab 04/14/16 0218  NA 143  K 4.1  CL 106  CO2 29  GLUCOSE 157*  BUN 19  CREATININE 1.52*  CALCIUM 9.1   GFR: CrCl cannot be calculated (Unknown ideal weight.). Liver Function Tests:  Recent Labs Lab 04/14/16 0218  AST 35  ALT 23  ALKPHOS 50  BILITOT 1.2  PROT 6.4*  ALBUMIN 3.6    Recent Labs Lab 04/14/16 0218  LIPASE 21   No results for input(s): AMMONIA in the last 168 hours. Coagulation Profile: No results for input(s): INR, PROTIME in the last 168 hours. Cardiac Enzymes: No results for input(s): CKTOTAL, CKMB, CKMBINDEX, TROPONINI in the last 168 hours. BNP (last 3 results) No results for input(s): PROBNP in the last 8760 hours. HbA1C: No results for input(s):  HGBA1C in the last 72 hours. CBG: No results for input(s): GLUCAP in the last 168 hours. Lipid Profile: No results for input(s): CHOL, HDL, LDLCALC, TRIG, CHOLHDL, LDLDIRECT in the last 72 hours. Thyroid Function Tests: No results for input(s): TSH, T4TOTAL, FREET4, T3FREE, THYROIDAB in the last 72 hours. Anemia Panel: No results for input(s): VITAMINB12, FOLATE, FERRITIN, TIBC, IRON, RETICCTPCT in the last 72 hours. Urine analysis:    Component Value Date/Time   COLORURINE YELLOW 04/14/2016 0129   APPEARANCEUR CLEAR 04/14/2016 0129   LABSPEC 1.028 04/14/2016 0129   PHURINE 5.0 04/14/2016 0129   GLUCOSEU NEGATIVE 04/14/2016 0129   HGBUR NEGATIVE 04/14/2016 0129   BILIRUBINUR NEGATIVE 04/14/2016 0129   KETONESUR 5 (A) 04/14/2016 0129   PROTEINUR NEGATIVE 04/14/2016 0129   UROBILINOGEN 1.0 03/16/2014 2015   NITRITE NEGATIVE 04/14/2016 0129   LEUKOCYTESUR NEGATIVE 04/14/2016 0129   Sepsis Labs: @LABRCNTIP (procalcitonin:4,lacticidven:4) )No results found for this or any previous visit (from the past 240 hour(s)).   Radiological  Exams on Admission: Ct Abdomen Pelvis W Contrast  Result Date: 04/14/2016 CLINICAL DATA:  Vomiting since 17:00. Abdominal pain. Leukocytosis. EXAM: CT ABDOMEN AND PELVIS WITH CONTRAST TECHNIQUE: Multidetector CT imaging of the abdomen and pelvis was performed using the standard protocol following bolus administration of intravenous contrast. CONTRAST:  137m ISOVUE-300 IOPAMIDOL (ISOVUE-300) INJECTION 61% COMPARISON:  02/08/2015 FINDINGS: Lower chest: Mild linear scarring in the bases.  No acute findings. Hepatobiliary: Cholecystectomy. No focal liver lesions. No bile duct dilatation. Pancreas: Mild atrophy of the pancreas, unchanged. No pancreatic mass or inflammation. Spleen: Normal in size without focal abnormality. Adrenals/Urinary Tract: Both adrenals are normal. 5 mm calculus of the lower pole right renal collecting system. No ureteral calculi. 12 mm lateral right midpole renal cyst. No suspicious renal parenchymal lesions. A urinary bladder is unremarkable. Stomach/Bowel: Small hiatal hernia. Stomach is otherwise unremarkable. Abnormal small bowel dilatation consistent with obstruction. Transition point is the same as on recent prior studies, approximately 10 cm from the neoterminal ileum anastomosis to the ascending colon, at an apparent stricture. Colon is decompressed. Unchanged lamellated calcifications within the lumen of dilated small bowel. Vascular/Lymphatic: The abdominal aorta is normal in caliber with mild atherosclerotic calcification. No adenopathy in the abdomen or pelvis. Reproductive: Unremarkable Other: No ascites Musculoskeletal: No significant skeletal lesion. IMPRESSION: 1. Small bowel obstruction with transition point at a stricture of the distal ileum, approximately 10 cm from the anastomosis with the ascending colon. 2. Persistent lamellated calcifications within the lumen of dilated small bowel up present on the past several examinations. 3. Hiatal hernia 4. Right nephrolithiasis,  nonobstructing. 5. Aortic atherosclerosis. Electronically Signed   By: DAndreas NewportM.D.   On: 04/14/2016 03:27    EKG: Not performed, will obtain as appropriate  Assessment/Plan  1. SBO  - Presents with lower abd pain, N/V, and found to have SBO on CT  - Two prior SBO's resolved with bowel rest in 2014 and 2016  - There are bowel sounds appreciated on admission  - Nausea/vomiting has been well-controlled with antiemetics; will hold-off on NGT for now  - Bowel rest, IVF hydration, prn analgesia, prn antiemetics    2. Chrohn's disease  - Followed by [facility]  - No evidence for active disease per GI notes from 02/2016  - Has been on prednisone intermittently over the years and previously on low dose 6MP until stopped last year - No active inflammation noted on CT   3. CKD stage II  - SCr is 1.52  on admission, a little up from apparent baseline of ~1.3 - He was given 1 liter NS in ED and will be continued on IVF with NS  - Repeat chem panel periodically   4. Leukocytosis  - WBC is 18.4 on admission without fever or apparent focus of infection - Possibly reactive to vomiting  - Culture if febrile     DVT prophylaxis: sq Lovenox  Code Status: Full Family Communication: Wife updated at bedside Disposition Plan: Observe on med-surg Consults called: None Admission status: Observation   Vianne Bulls, MD [facility] Pager [phone]  If 7PM-7AM, please contact night-coverage [email or web address] Password [email or web address]  04/14/2016, 4:10 AM

## 2016-04-14 NOTE — ED Notes (Signed)
Pt in ct 

## 2016-04-14 NOTE — Progress Notes (Signed)
Progress note  Patient admitted early this morning. See H&P. Patient admitted with small bowel obstruction, history of Crohn disease. He has had previous episodes of small bowel obstruction and was previously told that he should avoid surgical intervention unless absolutely necessary due to scarring from his previous surgical operations related to Crohn's. Previous small bowel obstruction has resolved with conservative management. Patient admits to nausea, vomiting, abdominal pain prior to admission. Currently, he has not vomited this morning. Abdominal pain has resolved. Passing small amounts of gas. No bowel movement yet. Abdominal exam is benign, has bowel sounds  Continue conservative management. Continue IV fluid Nothing by mouth for now, could progress to clear liquid diet later today if patient does not have episodes of nausea, vomiting, abdominal pain Discussed with wife at bedside  Dessa Phi, DO [facility] [email or web address] Password [email or web address] 04/14/2016, 10:38 AM

## 2016-04-14 NOTE — ED Provider Notes (Signed)
[facility] DEPT Provider Note   CSN: [id number] Arrival date & time: 04/14/16  0108    By signing my name below, I, Macon Large, attest that this documentation has been prepared under the direction and in the presence of Sherwood Gambler, MD. Electronically Signed: Macon Large, ED Scribe. 04/14/16. 2:03 AM.  History   Chief Complaint Chief Complaint  Patient presents with  . Emesis   The history is provided by the patient and the spouse. No language interpreter was used.   HPI Comments: Ricardo Villa is a 82 y.o. male with PMHx of Crohn's disease and small bowel obstruction who presents to the Emergency Department complaining of moderate, intermittent, generalized abdominal pain accompanied by episodic emesis onset ~8pm last night. Pt notes he has had similar flare-ups in the past due to his crohn's disease. No alleviating factors noted. He states his last bowel movement was earlier this morning. Pt denies fever, CP, SOB.   Past Medical History:  Diagnosis Date  . Allergy   . Cataract    been removed   . Crohn disease ([place])   . Dehydration 03/01/2013   Diarrhea due to crohn's disease.   . Diverticulosis   . GERD (gastroesophageal reflux disease)   . Glaucoma   . History of adenomatous polyp of colon   . Hyperhomocysteinemia ([place])   . Hyperlipidemia   . PONV (postoperative nausea and vomiting)    "due to lots of mucus and phelgm in throat"-all with colonoscopy and years ago with gall bladder surgery   . Prostate cancer [facility])    "watchful waiting"- rechecks every 3- 6 months- Dr. Alinda Money  . SBO (small bowel obstruction) 03/17/2014  . Small bowel obstruction   . TIA (transient ischemic attack)    HX OF   . Vitamin B12 deficiency     Patient Active Problem List   Diagnosis Date Noted  . Colon polyp   . Crohn's disease of small intestine with intestinal obstruction ([place]) 02/08/2015  . Crohn's disease of both small and large intestine with intestinal  obstruction ([place]) 06/09/2014  . Hypernatremia   . Hypokalemia   . Acute renal failure ([place]) 03/16/2014  . Leucocytosis 03/16/2014  . TIA (transient ischemic attack) 03/01/2013  . Dehydration 03/01/2013  . SBO (small bowel obstruction) ([place]) 12/10/2012  . Septic shock(785.52) 12/10/2012  . AKI (acute kidney injury) ([place]) 12/10/2012  . VITAMIN B12 DEFICIENCY 03/23/2008  . 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 HYPERLIPIDEMIA 03/23/2008  . CROHN'S DISEASE 03/23/2008  . IRON DEFICIENCY ANEMIA, HX OF 03/23/2008  . History of colonic polyps 03/23/2008  . SMALL BOWEL OBSTRUCTION, HX OF 03/23/2008    Past Surgical History:  Procedure Laterality Date  . APPENDECTOMY    . CATARACT EXTRACTION     both eyes  . CHOLECYSTECTOMY    . COLONOSCOPY    . COLONOSCOPY WITH PROPOFOL N/A 07/24/2015   Procedure: COLONOSCOPY WITH PROPOFOL;  Surgeon: Manus Gunning, MD;  Location: [facility];  Service: Gastroenterology;  Laterality: N/A;  . COLONOSCOPY WITH PROPOFOL N/A 12/12/2015   Procedure: COLONOSCOPY WITH PROPOFOL;  Surgeon: Manus Gunning, MD;  Location: [facility];  Service: Gastroenterology;  Laterality: N/A;  . HOT HEMOSTASIS N/A 12/12/2015   Procedure: HOT HEMOSTASIS (ARGON PLASMA COAGULATION/BICAP);  Surgeon: Manus Gunning, MD;  Location: [facility];  Service: Gastroenterology;  Laterality: N/A;  . POLYPECTOMY    . terminal ileum resection    . TONSILLECTOMY    . [place]  Medications    Prior to Admission medications   Medication Sig Start Date End Date Taking? Authorizing Provider  aspirin EC 81 MG tablet Take 81 mg by mouth daily.    Historical Provider, MD  B-12, Methylcobalamin, 1000 MCG SUBL Place 1,000 mcg under the tongue daily.     Historical Provider, MD  calcium carbonate (TUMS - DOSED IN MG ELEMENTAL CALCIUM) 500 MG chewable tablet Chew 1 tablet by mouth as needed for indigestion or heartburn.    Historical Provider, MD    diphenoxylate-atropine (LOMOTIL) 2.5-0.025 MG tablet TAKE ONE TABLET 1 OR 2 TIMES DAILY AS NEEDED FOR DIARRHEA OR LOOSE STOOLS. 11/16/15   Manus Gunning, MD    Family History Family History  Problem Relation Age of Onset  . Crohn's disease Mother   . Heart disease Father   . Stroke Father   . Diabetes Paternal Grandfather   . Colon cancer Neg Hx   . Esophageal cancer Neg Hx   . Rectal cancer Neg Hx   . Stomach cancer Neg Hx   . Colon polyps Neg Hx     Social History Social History  Substance Use Topics  . Smoking status: Former Smoker    Types: Cigarettes    Quit date: 07/16/1988  . Smokeless tobacco: Never Used  . Alcohol use 0.0 oz/week     Comment: occasionally to rare     Allergies   Patient has no known allergies.   Review of Systems Review of Systems  Constitutional: Negative for fever.  Respiratory: Negative for shortness of breath.   Cardiovascular: Negative for chest pain.  Gastrointestinal: Positive for abdominal pain and vomiting.  All other systems reviewed and are negative.    Physical Exam Updated Vital Signs BP 144/68 (BP Location: Left Arm)   Pulse 106   Temp 97.7 F (36.5 C) (Oral)   Resp 18   SpO2 97%   Physical Exam  Constitutional: He is oriented to person, place, and time. He appears well-developed and well-nourished.  HENT:  Head: Normocephalic and atraumatic.  Right Ear: External ear normal.  Left Ear: External ear normal.  Nose: Nose normal.  Eyes: Right eye exhibits no discharge. Left eye exhibits no discharge.  Neck: Neck supple.  Cardiovascular: Normal rate, regular rhythm and normal heart sounds.   Pulmonary/Chest: Effort normal and breath sounds normal.  Abdominal: Soft. He exhibits distension (mild). There is no tenderness.  Multiple well healed surgical scars.   Musculoskeletal: He exhibits no edema.  Neurological: He is alert and oriented to person, place, and time.  Skin: Skin is warm and dry.  Nursing note  and vitals reviewed.    ED Treatments / Results   DIAGNOSTIC STUDIES: Oxygen Saturation is 97% on RA, normal by my interpretation.    COORDINATION OF CARE: 2:01 AM Discussed treatment plan with pt at bedside which includes labs and abdomen with pelvis imaging and pt agreed to plan.   Labs (all labs ordered are listed, but only abnormal results are displayed) Labs Reviewed  COMPREHENSIVE METABOLIC PANEL - Abnormal; Notable for the following:       Result Value   Glucose, Bld 157 (*)    Creatinine, Ser 1.52 (*)    Total Protein 6.4 (*)    GFR calc non Af Amer 42 (*)    GFR calc Af Amer 48 (*)    All other components within normal limits  CBC - Abnormal; Notable for the following:    WBC 18.4 (*)  All other components within normal limits  URINALYSIS, ROUTINE W REFLEX MICROSCOPIC - Abnormal; Notable for the following:    Ketones, ur 5 (*)    All other components within normal limits  LIPASE, BLOOD    EKG  EKG Interpretation None       Radiology Ct Abdomen Pelvis W Contrast  Result Date: 04/14/2016 CLINICAL DATA:  Vomiting since 17:00. Abdominal pain. Leukocytosis. EXAM: CT ABDOMEN AND PELVIS WITH CONTRAST TECHNIQUE: Multidetector CT imaging of the abdomen and pelvis was performed using the standard protocol following bolus administration of intravenous contrast. CONTRAST:  159m ISOVUE-300 IOPAMIDOL (ISOVUE-300) INJECTION 61% COMPARISON:  02/08/2015 FINDINGS: Lower chest: Mild linear scarring in the bases.  No acute findings. Hepatobiliary: Cholecystectomy. No focal liver lesions. No bile duct dilatation. Pancreas: Mild atrophy of the pancreas, unchanged. No pancreatic mass or inflammation. Spleen: Normal in size without focal abnormality. Adrenals/Urinary Tract: Both adrenals are normal. 5 mm calculus of the lower pole right renal collecting system. No ureteral calculi. 12 mm lateral right midpole renal cyst. No suspicious renal parenchymal lesions. A urinary bladder is  unremarkable. Stomach/Bowel: Small hiatal hernia. Stomach is otherwise unremarkable. Abnormal small bowel dilatation consistent with obstruction. Transition point is the same as on recent prior studies, approximately 10 cm from the neoterminal ileum anastomosis to the ascending colon, at an apparent stricture. Colon is decompressed. Unchanged lamellated calcifications within the lumen of dilated small bowel. Vascular/Lymphatic: The abdominal aorta is normal in caliber with mild atherosclerotic calcification. No adenopathy in the abdomen or pelvis. Reproductive: Unremarkable Other: No ascites Musculoskeletal: No significant skeletal lesion. IMPRESSION: 1. Small bowel obstruction with transition point at a stricture of the distal ileum, approximately 10 cm from the anastomosis with the ascending colon. 2. Persistent lamellated calcifications within the lumen of dilated small bowel up present on the past several examinations. 3. Hiatal hernia 4. Right nephrolithiasis, nonobstructing. 5. Aortic atherosclerosis. Electronically Signed   By: DAndreas NewportM.D.   On: 04/14/2016 03:27    Procedures Procedures (including critical care time)  Medications Ordered in ED Medications  sodium chloride 0.9 % bolus 1,000 mL (not administered)     Initial Impression / Assessment and Plan / ED Course  I have reviewed the triage vital signs and the nursing notes.  Pertinent labs & imaging results that were available during my care of the patient were reviewed by me and considered in my medical decision making (see chart for details).     Patient appears well, no current vomiting. Feels well but with SBO on CT, will admit for hydration and bowel rest.   Final Clinical Impressions(s) / ED Diagnoses   Final diagnoses:  Small bowel obstruction    New Prescriptions New Prescriptions   No medications on file   I personally performed the services described in this documentation, which was scribed in my  presence. The recorded information has been reviewed and is accurate.     SSherwood Gambler MD 04/14/16 [phone]

## 2016-04-14 NOTE — ED Triage Notes (Signed)
Pt w/ hx of crohn's has been vomiting since yesterday at approximately 1700.  Pt c/o abdominal pain states he had a SBO in the past.  Rates pain 6/10.

## 2016-04-15 DIAGNOSIS — K56609 Unspecified intestinal obstruction, unspecified as to partial versus complete obstruction: Secondary | ICD-10-CM | POA: Diagnosis not present

## 2016-04-15 LAB — BASIC METABOLIC PANEL
Anion gap: 6 (ref 5–15)
BUN: 20 mg/dL (ref 6–20)
CALCIUM: 7.8 mg/dL — AB (ref 8.9–10.3)
CO2: 24 mmol/L (ref 22–32)
Chloride: 116 mmol/L — ABNORMAL HIGH (ref 101–111)
Creatinine, Ser: 1.23 mg/dL (ref 0.61–1.24)
GFR calc Af Amer: >60 mL/min (ref >60)
GFR, EST NON AFRICAN AMERICAN: 54 mL/min — AB (ref >60)
GLUCOSE: 112 mg/dL — AB (ref 65–99)
Potassium: 3.5 mmol/L (ref 3.5–5.1)
Sodium: 146 mmol/L — ABNORMAL HIGH (ref 135–145)

## 2016-04-15 LAB — CBC WITH DIFFERENTIAL/PLATELET
BASOS ABS: 0 10*3/uL (ref 0.0–0.1)
BASOS PCT: 0 %
EOS ABS: 0.2 10*3/uL (ref 0.0–0.7)
Eosinophils Relative: 2 %
HEMATOCRIT: 34.6 % — AB (ref 39.0–52.0)
HEMOGLOBIN: 11.4 g/dL — AB (ref 13.0–17.0)
Lymphocytes Relative: 26 %
Lymphs Abs: 1.7 10*3/uL (ref 0.7–4.0)
MCH: 28.9 pg (ref 26.0–34.0)
MCHC: 32.9 g/dL (ref 30.0–36.0)
MCV: 87.8 fL (ref 78.0–100.0)
Monocytes Absolute: 1 10*3/uL (ref 0.1–1.0)
Monocytes Relative: 15 %
NEUTROS ABS: 3.8 10*3/uL (ref 1.7–7.7)
NEUTROS PCT: 57 %
PLATELETS: 168 10*3/uL (ref 150–400)
RBC: 3.94 MIL/uL — AB (ref 4.22–5.81)
RDW: 13.8 % (ref 11.5–15.5)
WBC: 6.7 10*3/uL (ref 4.0–10.5)

## 2016-04-15 NOTE — Discharge Instructions (Signed)
Small Bowel Obstruction A small bowel obstruction is a blockage in the small bowel. The small bowel, which is also called the small intestine, is a long, slender tube that connects the stomach to the colon. When a person eats and drinks, food and fluids go from the stomach to the small bowel. This is where most of the nutrients in the food and fluids are absorbed. A small bowel obstruction will prevent food and fluids from passing through the small bowel as they normally do during digestion. The small bowel can become partially or completely blocked. This can cause symptoms such as abdominal pain, vomiting, and bloating. If this condition is not treated, it can be dangerous because the small bowel could rupture. What are the causes? Common causes of this condition include:  Scar tissue from previous surgery or radiation treatment.  Recent surgery. This may cause the movements of the bowel to slow down and cause food to block the intestine.  Hernias.  Inflammatory bowel disease (colitis).  Twisting of the bowel (volvulus).  Tumors.  A foreign body.  Slipping of a part of the bowel into another part (intussusception). What are the signs or symptoms? Symptoms of this condition include:  Abdominal pain. This may be dull cramps or sharp pain. It may occur in one area, or it may be present in the entire abdomen. Pain can range from mild to severe, depending on the degree of obstruction.  Nausea and vomiting. Vomit may be greenish or a yellow bile color.  Abdominal bloating.  Constipation.  Lack of passing gas.  Frequent belching.  Diarrhea. This may occur if the obstruction is partial and runny stool is able to leak around the obstruction. How is this diagnosed? This condition may be diagnosed based on a physical exam, medical history, and X-rays of the abdomen. You may also have other tests, such as a CT scan of the abdomen and pelvis. How is this treated? Treatment for this  condition depends on the cause and severity of the problem. Treatment options may include:  Bed rest along with fluids and pain medicines that are given through an IV tube inserted into one of your veins. Sometimes, this is all that is needed for the obstruction to improve.  Following a simple diet. In some cases, a clear liquid diet may be required for several days. This allows the bowel to rest.  Placement of a small tube (nasogastric tube) into the stomach. When the bowel is blocked, it usually swells up like a balloon that is filled with air and fluids. The air and fluids may be removed by suction through the nasogastric tube. This can help with pain, discomfort, and nausea. It can also help the obstruction to clear up faster.  Surgery. This may be required if other treatments do not work. Bowel obstruction from a hernia may require early surgery and can be an emergency procedure. Surgery may also be required for scar tissue that causes frequent or severe obstructions. Follow these instructions at home:  Get plenty of rest.  Follow instructions from your health care provider about eating restrictions. You may need to avoid solid foods and consume only clear liquids until your condition improves.  Take over-the-counter and prescription medicines only as told by your health care provider.  Keep all follow-up visits as told by your health care provider. This is important. Contact a health care provider if:  You have a fever.  You have chills. Get help right away if:  You have increased  pain or cramping.  You vomit blood.  You have uncontrolled vomiting or nausea.  You cannot drink fluids because of vomiting or pain.  You develop confusion.  You begin feeling very dry or thirsty (dehydrated).  You have severe bloating.  You feel extremely weak or you faint. This information is not intended to replace advice given to you by your health care provider. Make sure you discuss any  questions you have with your health care provider. Document Released: 05/14/2005 Document Revised: 10/23/2015 Document Reviewed: 04/21/2014 Elsevier Interactive Patient Education  2017 [employer].

## 2016-04-15 NOTE — Progress Notes (Signed)
Patient tolerating his soft diet without complaints of nausea, vomiting, or abdominal pain. Patient ate egg sandwich states "I feel fine". Neta Mends RN 3:39 PM 04-15-2016

## 2016-04-15 NOTE — Discharge Summary (Signed)
Physician Discharge Summary  Ricardo Villa:[id number] DOB: Jan 02, 1935 DOA: 04/14/2016  PCP: [name], MD  Admit date: 04/14/2016 Discharge date: 04/15/2016  Admitted From: Home Disposition:  Home  Recommendations for Outpatient Follow-up:  1. Follow up with PCP in 1 week 2. Please obtain BMP/CBC in 1 week   Home Health: No  Equipment/Devices: None   Discharge Condition: Stable CODE STATUS: Full  Diet recommendation: Soft diet, advance as tolerated   Brief/Interim Summary: From H&P: Ricardo Villa is a 82 y.o. male with medical history significant for Crohn's disease with ileal and cecal resections in the 1980s and 1990s, now presenting to the emergency department with 1 day of abdominal pain, nausea, and vomiting. Patient reports that he had been in his usual state of health with no signs of active Crohn's disease in the past couple years until last night after eating some leftovers, when he developed lower abdominal pain with nausea and nonbloody nonbilious vomiting. Patient describes the pain as intermittent, cramping, localized to the lower quadrants, worse with any oral intake, and slightly better with vomiting. Patient denies any fevers or chills and cough or dyspnea. He has had bowel obstructions in 2014 and 2016 which resolved with conservative management. He had been on a low dose 6-MP until it was stopped last year.  Interim: He was managed conservatively for SBO with bowel rest. He had multiple episodes of large volume emesis and nausea resolved. For this reason, NGT was not placed. His bowel function returned slowly, abdominal pain resolved, nausea/vomiting resolved. His diet was slowly advanced.   Subjective on day of discharge: Anxious to go home today. Wants to advance diet. No abdominal pain, no nausea, no vomiting, had 2 BM yesterday.   Discharge Diagnoses:  Principal Problem:   Small bowel obstruction Active Problems:   Crohn's disease ([place])  SBO (small bowel obstruction) ([facility])   Leucocytosis   CKD (chronic kidney disease), stage II  1. SBO  - Two prior SBO's resolved with bowel rest in 2014 and 2016  - Improved with conservative therapy. No longer nauseous, vomiting, abdominal pain. Good bowel sounds, 2 episodes of BM yesterday. Diet advanced.   2. Chrohn's disease  - Followed by [facility]  - No evidence for active disease per GI notes from 02/2016  - Has been on prednisone intermittently over the years and previously on low dose 6MP until stopped last year - No active inflammation noted on CT   3. CKD stage II  - SCr is 1.52 on admission, stable   4. Leukocytosis  - Resolved   Discharge Instructions  Discharge Instructions    Call MD for:  persistant nausea and vomiting    Complete by:  As directed    Call MD for:  severe uncontrolled pain    Complete by:  As directed    Diet - low sodium heart healthy    Complete by:  As directed    Increase activity slowly    Complete by:  As directed      Allergies as of 04/15/2016   No Known Allergies     Medication List    STOP taking these medications   diphenoxylate-atropine 2.5-0.025 MG tablet Commonly known as:  LOMOTIL     TAKE these medications   aspirin EC 81 MG tablet Take 81 mg by mouth daily.   B-12 (Methylcobalamin) 1000 MCG Subl Place 1,000 mcg under the tongue daily.   calcium carbonate 500 MG chewable tablet Commonly known as:  TUMS - dosed in mg elemental calcium Chew 1 tablet by mouth daily as needed for indigestion or heartburn.      Follow-up Information    [name], MD. Schedule an appointment as soon as possible for a visit in 1 week(s).   Specialty:  Family Medicine Contact information: [place] [address] [phone]          No Known Allergies  Consultations:  none   Procedures/Studies: Ct Abdomen Pelvis W Contrast  Result Date: 04/14/2016 CLINICAL DATA:  Vomiting since  17:00. Abdominal pain. Leukocytosis. EXAM: CT ABDOMEN AND PELVIS WITH CONTRAST TECHNIQUE: Multidetector CT imaging of the abdomen and pelvis was performed using the standard protocol following bolus administration of intravenous contrast. CONTRAST:  163m ISOVUE-300 IOPAMIDOL (ISOVUE-300) INJECTION 61% COMPARISON:  02/08/2015 FINDINGS: Lower chest: Mild linear scarring in the bases.  No acute findings. Hepatobiliary: Cholecystectomy. No focal liver lesions. No bile duct dilatation. Pancreas: Mild atrophy of the pancreas, unchanged. No pancreatic mass or inflammation. Spleen: Normal in size without focal abnormality. Adrenals/Urinary Tract: Both adrenals are normal. 5 mm calculus of the lower pole right renal collecting system. No ureteral calculi. 12 mm lateral right midpole renal cyst. No suspicious renal parenchymal lesions. A urinary bladder is unremarkable. Stomach/Bowel: Small hiatal hernia. Stomach is otherwise unremarkable. Abnormal small bowel dilatation consistent with obstruction. Transition point is the same as on recent prior studies, approximately 10 cm from the neoterminal ileum anastomosis to the ascending colon, at an apparent stricture. Colon is decompressed. Unchanged lamellated calcifications within the lumen of dilated small bowel. Vascular/Lymphatic: The abdominal aorta is normal in caliber with mild atherosclerotic calcification. No adenopathy in the abdomen or pelvis. Reproductive: Unremarkable Other: No ascites Musculoskeletal: No significant skeletal lesion. IMPRESSION: 1. Small bowel obstruction with transition point at a stricture of the distal ileum, approximately 10 cm from the anastomosis with the ascending colon. 2. Persistent lamellated calcifications within the lumen of dilated small bowel up present on the past several examinations. 3. Hiatal hernia 4. Right nephrolithiasis, nonobstructing. 5. Aortic atherosclerosis. Electronically Signed   By: DAndreas NewportM.D.   On:  04/14/2016 03:27       Discharge Exam: Vitals:   04/14/16 2107 04/15/16 0532  BP: 116/63 (!) 123/53  Pulse: 99 77  Resp: 16 16  Temp: 97.4 F (36.3 C) 98.2 F (36.8 C)   Vitals:   04/14/16 0447 04/14/16 1508 04/14/16 2107 04/15/16 0532  BP: (!) 169/89 124/62 116/63 (!) 123/53  Pulse: (!) 122 (!) 106 99 77  Resp: 18 18 16 16   Temp: 98.3 F (36.8 C) 98.3 F (36.8 C) 97.4 F (36.3 C) 98.2 F (36.8 C)  TempSrc: Oral Oral Axillary Oral  SpO2: 97% 96% 95% 96%  Weight: 77 kg (169 lb 11.2 oz)     Height: 5' 11"  (1.803 m)       General: Pt is alert, awake, not in acute distress Cardiovascular: RRR, S1/S2 +, no rubs, no gallops Respiratory: CTA bilaterally, no wheezing, no rhonchi Abdominal: Soft, NT, ND, bowel sounds +, benign abdominal exam  Extremities: no edema, no cyanosis    The results of significant diagnostics from this hospitalization (including imaging, microbiology, ancillary and laboratory) are listed below for reference.     Microbiology: No results found for this or any previous visit (from the past 240 hour(s)).   Labs: BNP (last 3 results) No results for input(s): BNP in the last 8760 hours. Basic Metabolic Panel:  Recent Labs Lab 04/14/16  0218 04/15/16 0447  NA 143 146*  K 4.1 3.5  CL 106 116*  CO2 29 24  GLUCOSE 157* 112*  BUN 19 20  CREATININE 1.52* 1.23  CALCIUM 9.1 7.8*   Liver Function Tests:  Recent Labs Lab 04/14/16 0218  AST 35  ALT 23  ALKPHOS 50  BILITOT 1.2  PROT 6.4*  ALBUMIN 3.6    Recent Labs Lab 04/14/16 0218  LIPASE 21   No results for input(s): AMMONIA in the last 168 hours. CBC:  Recent Labs Lab 04/14/16 0218 04/15/16 0447  WBC 18.4* 6.7  NEUTROABS  --  3.8  HGB 14.4 11.4*  HCT 43.2 34.6*  MCV 86.4 87.8  PLT 222 168   Cardiac Enzymes: No results for input(s): CKTOTAL, CKMB, CKMBINDEX, TROPONINI in the last 168 hours. BNP: Invalid input(s): POCBNP CBG: No results for input(s): GLUCAP in the  last 168 hours. D-Dimer No results for input(s): DDIMER in the last 72 hours. Hgb A1c No results for input(s): HGBA1C in the last 72 hours. Lipid Profile No results for input(s): CHOL, HDL, LDLCALC, TRIG, CHOLHDL, LDLDIRECT in the last 72 hours. Thyroid function studies No results for input(s): TSH, T4TOTAL, T3FREE, THYROIDAB in the last 72 hours.  Invalid input(s): FREET3 Anemia work up No results for input(s): VITAMINB12, FOLATE, FERRITIN, TIBC, IRON, RETICCTPCT in the last 72 hours. Urinalysis    Component Value Date/Time   COLORURINE YELLOW 04/14/2016 0129   APPEARANCEUR CLEAR 04/14/2016 0129   LABSPEC 1.028 04/14/2016 0129   PHURINE 5.0 04/14/2016 0129   GLUCOSEU NEGATIVE 04/14/2016 0129   HGBUR NEGATIVE 04/14/2016 0129   BILIRUBINUR NEGATIVE 04/14/2016 0129   KETONESUR 5 (A) 04/14/2016 0129   PROTEINUR NEGATIVE 04/14/2016 0129   UROBILINOGEN 1.0 03/16/2014 2015   NITRITE NEGATIVE 04/14/2016 0129   LEUKOCYTESUR NEGATIVE 04/14/2016 0129   Sepsis Labs Invalid input(s): PROCALCITONIN,  WBC,  LACTICIDVEN Microbiology No results found for this or any previous visit (from the past 240 hour(s)).   Time coordinating discharge: 35 minutes  SIGNED:  Dessa Phi, DO [facility] Pager [phone]  If 7PM-7AM, please contact night-coverage [email or web address] Password [email or web address] 04/15/2016, 2:07 PM

## 2016-04-16 ENCOUNTER — Telehealth: Payer: Self-pay | Admitting: Gastroenterology

## 2016-04-16 NOTE — Telephone Encounter (Signed)
Spoke to patient, let him know that Dr.Gessner advises to stop the lomotil for now. I have scheduled him to see APP on 2/12 for hospitalization follow up.

## 2016-04-16 NOTE — Telephone Encounter (Signed)
Patient recently hospitalized for SBO and instructed to stop taking the Lomotil. Patient is wanting to verify this advice. Please advise.

## 2016-04-16 NOTE — Telephone Encounter (Signed)
It makes sense to avoid Lomotil given recent SBO If not already scheduled he should get a f/u with Dr. Havery Moros

## 2016-04-22 ENCOUNTER — Ambulatory Visit (INDEPENDENT_AMBULATORY_CARE_PROVIDER_SITE_OTHER): Payer: Medicare Other | Admitting: Nurse Practitioner

## 2016-04-22 ENCOUNTER — Encounter: Payer: Self-pay | Admitting: Nurse Practitioner

## 2016-04-22 VITALS — BP 124/66 | HR 70 | Ht 71.0 in | Wt 167.0 lb

## 2016-04-22 DIAGNOSIS — K50018 Crohn's disease of small intestine with other complication: Secondary | ICD-10-CM | POA: Diagnosis not present

## 2016-04-23 NOTE — Progress Notes (Signed)
HPI: Patient is an 82 year old male with long-standing history of ileal Crohn's disease with strictures. He is status post 2 remote small bowel resections..Patient was hospitalized in 2014 and 2016 for small bowel obstructions which resolved with conservative measures. He hasn't had signs of active Crohn disease on last few colonoscopies and after risk-benefit analysis we stopped the medication a few months ago.   Patient was hospitalized earlier this month with recurrent small bowel obstruction. CT scan with contrast showed transition point of the stricture at the distal ileum, possibly 10 cm from the anastomosis. Patient had gone in to the emergency department with vomiting. Again, small bowel obstruction resolved with conservative measures. We did not see the patient during this recent admission but he is here for a follow-up. Patient feels great, he is having no further nausea or vomiting. He is eating solids, bowel minutes are normal. No abdominal pain.  Other pertinent GI history includes adenomatous colon polyps. He is under close surveillance for a large TVA found on colonoscopy in 2014. At time of last colonoscopy in Oct 2017 there was concern for residual tissue at polypectomy site but the location made it technically challenging to remove.    Past Medical History:  Diagnosis Date  . Allergy   . Cataract    been removed   . Crohn disease ([place])   . Dehydration 03/01/2013   Diarrhea due to crohn's disease.   . Diverticulosis   . GERD (gastroesophageal reflux disease)   . Glaucoma   . History of adenomatous polyp of colon   . Hyperhomocysteinemia ([place])   . Hyperlipidemia   . PONV (postoperative nausea and vomiting)    "due to lots of mucus and phelgm in throat"-all with colonoscopy and years ago with gall bladder surgery   . Prostate cancer [facility])    "watchful waiting"- rechecks every 3- 6 months- Dr. Alinda Money  . SBO (small bowel obstruction) 03/17/2014  . Small bowel  obstruction   . TIA   . TIA (transient ischemic attack)    HX OF   . Vitamin B12 deficiency     Patient's surgical history, family medical history, social history, medications and allergies were all reviewed in Epic    Physical Exam: BP 124/66   Pulse 70   Ht 5' 11"  (1.803 m)   Wt 167 lb (75.8 kg)   BMI 23.29 kg/m   GENERAL: pleasant thin white male in NAD PSYCH: :Pleasant, cooperative, normal affect HEENT: Normocephalic, conjunctiva pink, mucous membranes moist, neck supple without masses CARDIAC:  RRR,  murmur heard,  noperipheral edema PULM: Normal respiratory effort, lungs CTA bilaterally, no wheezing ABDOMEN:  soft, nontender, nondistended, no obvious masses, no hepatomegaly,  normal bowel sounds SKIN:  turgor, no lesions seen Musculoskeletal:  Normal muscle tone, normal strength NEURO: Alert and oriented x 3, no focal neurologic deficits   ASSESSMENT and PLAN:  1. Pleasant 82 year old male with longstanding ileal Crohn's disease, s/p two remote resections. He has a known ileal stricture and was hospitalized in 2014 and 2016 with SBO which responded to conservative measures. No active Crohn's disease on last few colonoscopies. Imuran stopped a few months ago after reassessing the risk / benefit of the drug. Patient was hospitalized earlier this month with another SBO secondary to ileal stricture. He again improved with conservation measures. Patient here for follow up. He feels great. No abdominal pain or nausea. Having normal stools and tolerating solids.  -needs to stay on a low residue diet.  We discussed early signs of obstruction such as nausea or distention. Should this occur patient will start clear liquids and call the office. Surgical referral likely next step if he develops another SBO -follow up with Dr. Havery Moros in a couple of months   2. Hx of adenomatous colon polyps. Under close surveillance for a large TVA found in 2014. Removed piecemeal fashion but at time  of last colonoscopy in Oct 2017 there was concern for residual polyp tissue at polypectomy site but location made it technically challenging to remove the tissue.   Tye Savoy , NP 04/23/2016, 4:50 PM

## 2016-04-24 ENCOUNTER — Telehealth: Payer: Self-pay

## 2016-04-24 NOTE — Telephone Encounter (Signed)
-----   Message from Ricardo Craze, NP sent at 04/24/2016  1:19 AM EST ----- Hi,  Can you please ask patient to follow a low fiber diet given his stricture. Please get him an appt with Dr. Havery Moros in 2 months  Thanks

## 2016-04-24 NOTE — Telephone Encounter (Signed)
Scheduled appointment for patient to follow up in April, also mailed copy of a low fiber diet guidelines to patient.

## 2016-04-24 NOTE — Progress Notes (Signed)
Agree with assessment and plan as outlined with the following additions: Longstanding ileal Crohns - remote ileal resections in 1980s and 1990s, essentially not on maintenance meds, now with his third bowel obstruction in the past few years. 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 Surgery would be the treatment for this stricture if he wants more definitive therapy, but he has wished to avoid this if possible. He should be on a low residual diet moving forward and follow up with me in upcoming weeks for reassessment. If any recurrence of symptoms he should contact us and go to clear liquids.   Otherwise he has had EMR of large right sided polyp, TVA, with some residual polyp during last colonoscopy given large size of the lesion, he will need another surveillance colonoscopy this year. Will discuss timing with him at his follow up.

## 2016-05-27 ENCOUNTER — Telehealth: Payer: Self-pay

## 2016-05-27 ENCOUNTER — Other Ambulatory Visit: Payer: Self-pay

## 2016-05-27 MED ORDER — DIPHENOXYLATE-ATROPINE 2.5-0.025 MG PO TABS: 1.0000 | ORAL_TABLET | Freq: Every day | ORAL | 3 refills | Status: DC | PRN

## 2016-05-27 NOTE — Telephone Encounter (Signed)
Received refill request for Atropine from [facility]. If ok to send in refill please give instructions. Thank you.

## 2016-05-27 NOTE — Telephone Encounter (Signed)
Ok. I will send it back to the pharmacy and inform that you do not prescribe this medication.

## 2016-05-27 NOTE — Telephone Encounter (Signed)
No, must be an error, this is not an outpatient medication and I won't prescribe it. Not sure why this is coming to [place]. He can call us directly if he has questions about it. Thanks

## 2016-05-27 NOTE — Telephone Encounter (Signed)
Refill request for Lomotil received from [facility]. Pt is to take 1-2 tabs daily prn. Sent #[phone] with 3 refills. Ok to send per Dr. Havery Moros.

## 2016-05-27 NOTE — Telephone Encounter (Signed)
Caryl Pina can you please clarify which medication he is requesting. I'm assuming Atropine is a typo. Thanks

## 2016-05-27 NOTE — Telephone Encounter (Signed)
Yes, it is for Atropine. It came through a fax from [facility]. Wasn't sure if you had prescribed it or not?

## 2016-06-11 ENCOUNTER — Ambulatory Visit: Payer: Medicare Other | Admitting: Gastroenterology

## 2016-06-13 ENCOUNTER — Encounter: Payer: Self-pay | Admitting: Gastroenterology

## 2016-06-13 ENCOUNTER — Ambulatory Visit (INDEPENDENT_AMBULATORY_CARE_PROVIDER_SITE_OTHER): Payer: Medicare Other | Admitting: Gastroenterology

## 2016-06-13 VITALS — BP 108/58 | HR 52 | Ht 70.5 in | Wt 166.5 lb

## 2016-06-13 DIAGNOSIS — K50012 Crohn's disease of small intestine with intestinal obstruction: Secondary | ICD-10-CM | POA: Diagnosis not present

## 2016-06-13 DIAGNOSIS — D126 Benign neoplasm of colon, unspecified: Secondary | ICD-10-CM

## 2016-06-13 NOTE — Patient Instructions (Addendum)
If you are age 80 or older, your body mass index should be between 23-30. Your Body mass index is 23.55 kg/m. If this is out of the aforementioned range listed, please consider follow up with your Primary Care Provider.  If you are age 71 or younger, your body mass index should be between 19-25. Your Body mass index is 23.55 kg/m. If this is out of the aformentioned range listed, please consider follow up with your Primary Care Provider.   We will contact Dr. Barns to have your records sent to our office.  Please follow up with Dr. Havery Moros in 6 months. We will contact you by letter when it is time to schedule.  Thank you.

## 2016-06-13 NOTE — Progress Notes (Signed)
HPI :  Crohns history: 82 y/o male with a longstanding history of stricturing ileal Crohns disease. He also has a remote history of prostate cancer in remission, and TIA. He was previously followed by Dr. Olevia Perches. He was diagnosed when he was in his early 1s. He has had 2 prior small bowel resections (ileocectomy and ileal resection per patient, in [place]). He has been hospitalized in 2014 and December 2016 for bowel obstructions which resolved with bowel rest. He has been on prednisone over the years intermittently. He had been on 6MP for several years at low dose, this was stopped in 2017. He had another bowel obstruction just proximal to the surgical anastomosis in Feb 2018 which responded to conservative therapy.   Prior Colonoscopy 12/2012 - he had a large tubulovilloous adenoma and another adenoma, one in ascending and one in sigmoid, not entirely removed given and attempted to be removed in piecemeal per Dr. Darra Lis LAST VISIT:  Patient was unfortunately admitted to the hospital in February 2018 for a recurrent small bowel obstruction, thought to orginate just proximal to the ileocolonic surgical anastomosis based on CT scan. He resolved his symptoms with bowel rest and conservative therapy.   No abdominal pains. Eating okay. Good appetite. No trouble with his bowels. No blood in the stools. Weight is stable.  He thinks he ate something, ham, without chewing it well, which he thinks caused the problem.   Patient has had 3 colonoscopies since March 2017 as outlined below. He had large adenomatous polyp attempted to be removed in 2014 but not successful and he had not had a follow up exam in 3 years. This lesion was identified on initial colonoscopy in March but not removed while in [place] given the size of the lesion. He had a large 4cm polyp, scarred down from initial polypectomy attempt. I believe mostly all of this has been removed on his last 2 exams, as outlined below.    Colonoscopy 05/31/15 - 4 cm right sided polyp (not attempted to be removed), nodularity at anastomosis - bx show TVA Colonoscopy 07/24/15 - EMR of large right sided polyp, fair prep Colonoscopy 12/12/15 - stenosis of surgical anastomosis but no inflammatory changes, 1cm anastomotic polyp removed path c/w adenoma, small area of residual polyp at EMR site removed with snare and treated with APC, path c/w adenoma  Past Medical History:  Diagnosis Date  . Allergy   . Cataract    been removed   . Crohn disease ([place])   . Dehydration 03/01/2013   Diarrhea due to crohn's disease.   . Diverticulosis   . GERD (gastroesophageal reflux disease)   . Glaucoma   . History of adenomatous polyp of colon   . Hyperhomocysteinemia ([place])   . Hyperlipidemia   . PONV (postoperative nausea and vomiting)    "due to lots of mucus and phelgm in throat"-all with colonoscopy and years ago with gall bladder surgery   . Prostate cancer [facility])    "watchful waiting"- rechecks every 3- 6 months- Dr. Alinda Money  . SBO (small bowel obstruction) 03/17/2014  . Small bowel obstruction   . TIA   . TIA (transient ischemic attack)    HX OF   . Vitamin B12 deficiency      Past Surgical History:  Procedure Laterality Date  . APPENDECTOMY    . CATARACT EXTRACTION     both eyes  . CHOLECYSTECTOMY    . COLONOSCOPY    . COLONOSCOPY WITH PROPOFOL N/A  07/24/2015   Procedure: COLONOSCOPY WITH PROPOFOL;  Surgeon: Manus Gunning, MD;  Location: [facility];  Service: Gastroenterology;  Laterality: N/A;  . COLONOSCOPY WITH PROPOFOL N/A 12/12/2015   Procedure: COLONOSCOPY WITH PROPOFOL;  Surgeon: Manus Gunning, MD;  Location: [facility];  Service: Gastroenterology;  Laterality: N/A;  . HOT HEMOSTASIS N/A 12/12/2015   Procedure: HOT HEMOSTASIS (ARGON PLASMA COAGULATION/BICAP);  Surgeon: Manus Gunning, MD;  Location: [facility];  Service: Gastroenterology;  Laterality: N/A;  . POLYPECTOMY    . terminal  ileum resection    . TONSILLECTOMY    . VASECTOMY     Family History  Problem Relation Age of Onset  . Crohn's disease Mother   . Heart disease Father   . Stroke Father   . Diabetes Paternal Grandfather   . Colon cancer Neg Hx   . Esophageal cancer Neg Hx   . Rectal cancer Neg Hx   . Stomach cancer Neg Hx   . Colon polyps Neg Hx    Social History  Substance Use Topics  . Smoking status: Former Smoker    Types: Cigarettes    Quit date: 07/16/1988  . Smokeless tobacco: Never Used  . Alcohol use 0.0 oz/week     Comment: occasionally to rare   Current Outpatient Prescriptions  Medication Sig Dispense Refill  . aspirin EC 81 MG tablet Take 81 mg by mouth daily.    . B-12, Methylcobalamin, 1000 MCG SUBL Place 1,000 mcg under the tongue daily.     . calcium carbonate (TUMS - DOSED IN MG ELEMENTAL CALCIUM) 500 MG chewable tablet Chew 1 tablet by mouth daily as needed for indigestion or heartburn.     . diphenoxylate-atropine (LOMOTIL) 2.5-0.025 MG tablet Take 1-2 tablets by mouth daily as needed for diarrhea or loose stools. 60 tablet 3  . fenofibrate 54 MG tablet Take 54 mg by mouth daily.    . ferrous sulfate 325 (65 FE) MG tablet Take 325 mg by mouth daily with breakfast.     No current facility-administered medications for this visit.    No Known Allergies   Review of Systems: All systems reviewed and negative except where noted in HPI.   Lab Results  Component Value Date   WBC 6.7 04/15/2016   HGB 11.4 (L) 04/15/2016   HCT 34.6 (L) 04/15/2016   MCV 87.8 04/15/2016   PLT 168 04/15/2016    Lab Results  Component Value Date   CREATININE 1.23 04/15/2016   BUN 20 04/15/2016   NA 146 (H) 04/15/2016   K 3.5 04/15/2016   CL 116 (H) 04/15/2016   CO2 24 04/15/2016    Lab Results  Component Value Date   ALT 23 04/14/2016   AST 35 04/14/2016   ALKPHOS 50 04/14/2016   BILITOT 1.2 04/14/2016     Physical Exam: BP (!) 108/58 (BP Location: Left Arm, Patient Position:  Sitting, Cuff Size: Normal)   Pulse (!) 52   Ht 5' 10.5" (1.791 m)   Wt 166 lb 8 oz (75.5 kg)   BMI 23.55 kg/m  Constitutional: Pleasant,well-developed, male in no acute distress. HEENT: Normocephalic and atraumatic. Conjunctivae are normal. No scleral icterus. Neck supple.  Cardiovascular: Normal rate, regular rhythm.  Pulmonary/chest: Effort normal and breath sounds normal. No wheezing, rales or rhonchi. Abdominal: Soft, nondistended, nontender.  There are no masses palpable. No hepatomegaly. Extremities: no edema Lymphadenopathy: No cervical adenopathy noted. Neurological: Alert and oriented to person place and time. Skin: Skin is warm and  dry. No rashes noted. Psychiatric: Normal mood and affect. Behavior is normal.   ASSESSMENT AND PLAN: 82 year old male here for reassessment of the following issues:  Stricturing Ileal Crohn's disease - history of obstructions as outlined above, most recently hospitalized in February. He's had 3 hospitalizations since 2014 due to this issue, and feels fine in between episodes. 2 remote surgeries for obstructions, last in 1990s. Previously on very low-dose 6-MP which was stopped in 2017. He has not had any evidence of active Crohn's disease on recent colonoscopy or on prior labs. I think he likely has fibrotic stricture causing his symptoms. Now asymptomatic post-hospitalization. I discussed options with him moving forward to include surveying his small bowel with an enterography study (MRE) to assess for active Crohn's disease, assess length the stricture. We discussed the implications of potential findings, in regards to potential need for medical or surgical therapy. His preference at this time is to avoid any medical or surgical therapy of his Crohn's disease. I think surgical resection of the stricture would likely be the most definitive way to prevent future obstruction/ hospitalization, however he adamantly declined an elective surgical evaluation,  and would only consider this if he had frequent hospitalizations. He also isn't interested in medical therapy for Crohn's disease if he has active inflammation given he is feeling well. We discussed risks and benefits of observation versus medical and surgical therapies at length, and after this discussion as above his preference is to observe at this time. I will obtain the results from his recent labwork done with Dr. Drema Dallas to ensure stable. He will see me in clinic again in 6 months for follow-up. If he has any symptoms in the interim I advised him to go to a liquid diet and contact me.   Colon adenoma - large 4 cm right-sided adenoma, scarred down from polypectomy attempt in 2014. Due to this issue he has had 2 colonoscopies for EMR and I do think this lesion has mostly been removed, although given technical difficultly due to scar tissue at the site there is a possibility for small focus of residual polyp after his last therapy. I had recommended a colonoscopy to be done again in 6 months from his last exam, which is now, however he wishes to hold off for another 6 months at this time given he's had a lot of exams in the past year. He wants to discuss this with me again in October. He understands potential risk of malignancy with residual polyp tissue. If this polyp can't be removed endoscopically will need to consider surgical management, which he wishes to avoid if at all possible.   [place] Cellar, MD [facility] Pager [phone]

## 2016-06-14 ENCOUNTER — Telehealth: Payer: Self-pay | Admitting: Gastroenterology

## 2016-06-14 NOTE — Telephone Encounter (Signed)
We obtained labs from Dr. Drema Dallas office of [facility] as follows:  04/30/16 - normal LFTs - CBC - WBC 8.2, Hgb 12.8, MCV 88, Plt 244  Will recheck during his office visit with me in 6 months.

## 2016-08-02 IMAGING — RF DG ABDOMEN 1V
3 series · 3 of 3 positions shown · IV contrast (agent unspecified)
Comparison: None.

CLINICAL DATA: Evaluate for nasogastric tube placement.

EXAM:
INTRO LONG GI TUBE; ABDOMEN - 1 VIEW
CONTRAST:  None
FLUOROSCOPY TIME:  Fluoroscopy Time (in minutes and seconds): 4
minutes and 0 seconds
Number of Acquired Images:  3

[Series 8: run · 1 of 1 slices shown (1 of 3)]
[im 1/1]
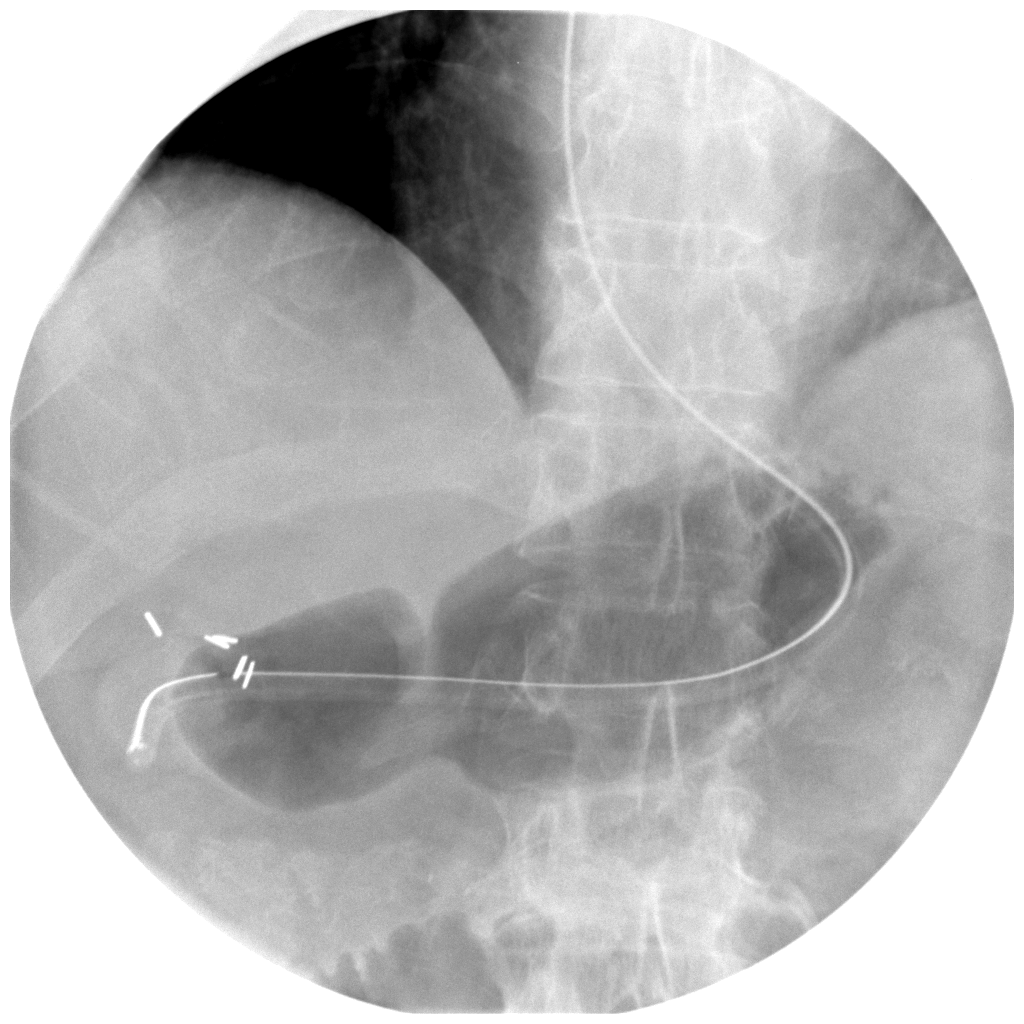

[Series 9: run · 1 of 1 slices shown (2 of 3)]
[im 1/1]
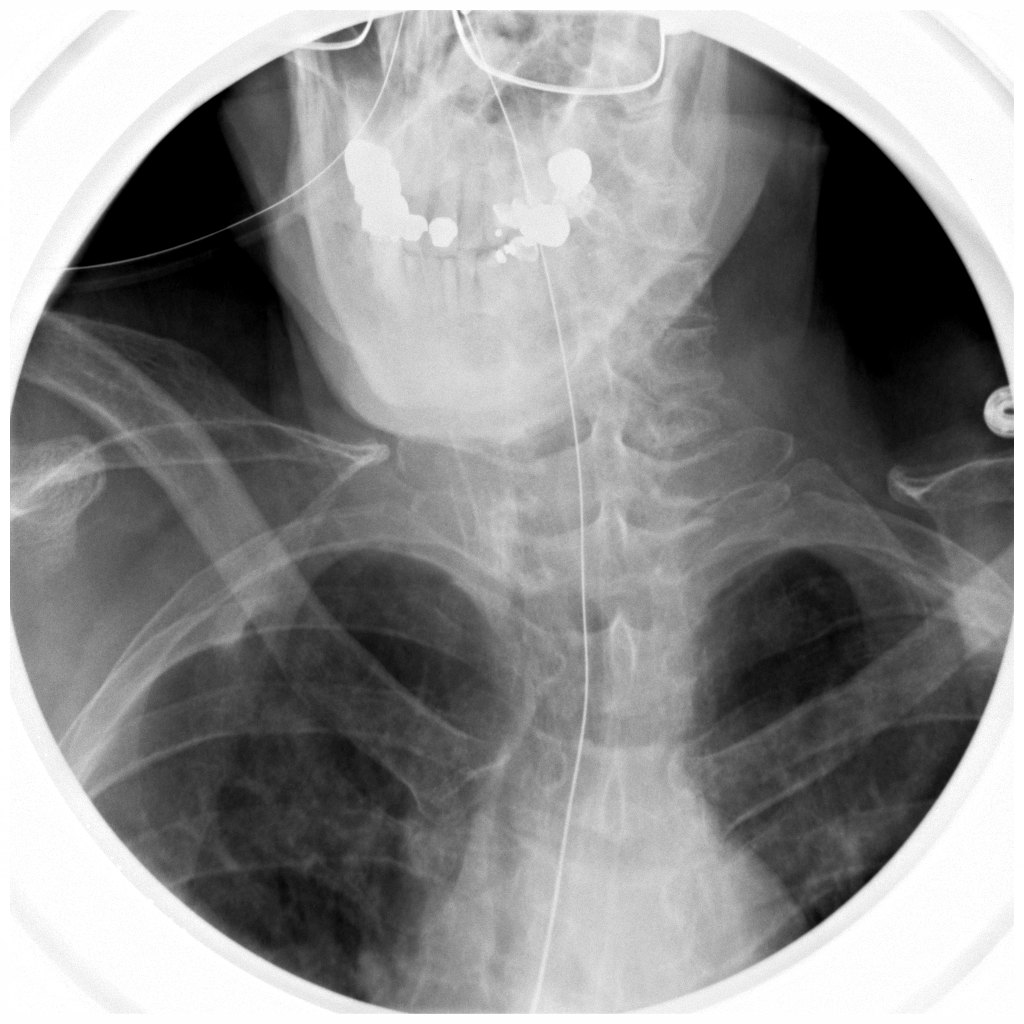

[Series 10: run · 1 of 1 slices shown (3 of 3)]
[im 1/1]
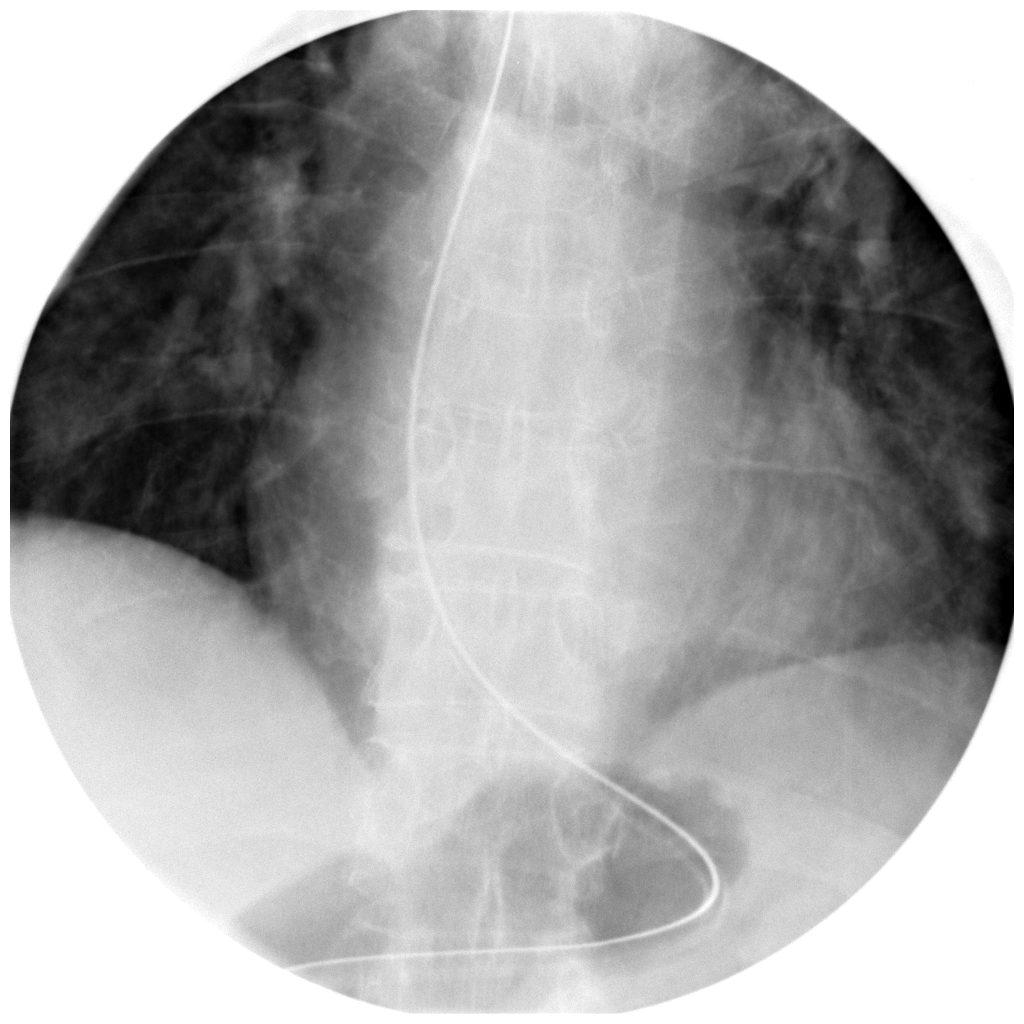

[3 of 3 positions shown; findings below may reference images not displayed]

FINDINGS: Three fluoroscopic spot images are provided showing the nasogastric
tube to be well positioned within the stomach with tip in the
expected region of the gastric pylorus/duodenal bulb.
IMPRESSION: Nasogastric tube appears well positioned with tip in the stomach
pylorus/duodenal bulb region.

4 minutes fluoroscopy time provided for the procedure.

## 2016-08-03 IMAGING — CR DG ABD PORTABLE 1V
2 series · 2 of 2 positions shown · non-contrast
Comparison: Abdominal radiograph 02/08/2015.

CLINICAL DATA: Patient with history of small bowel obstruction.

EXAM:
PORTABLE ABDOMEN - 1 VIEW

[AP (1 of 2)]
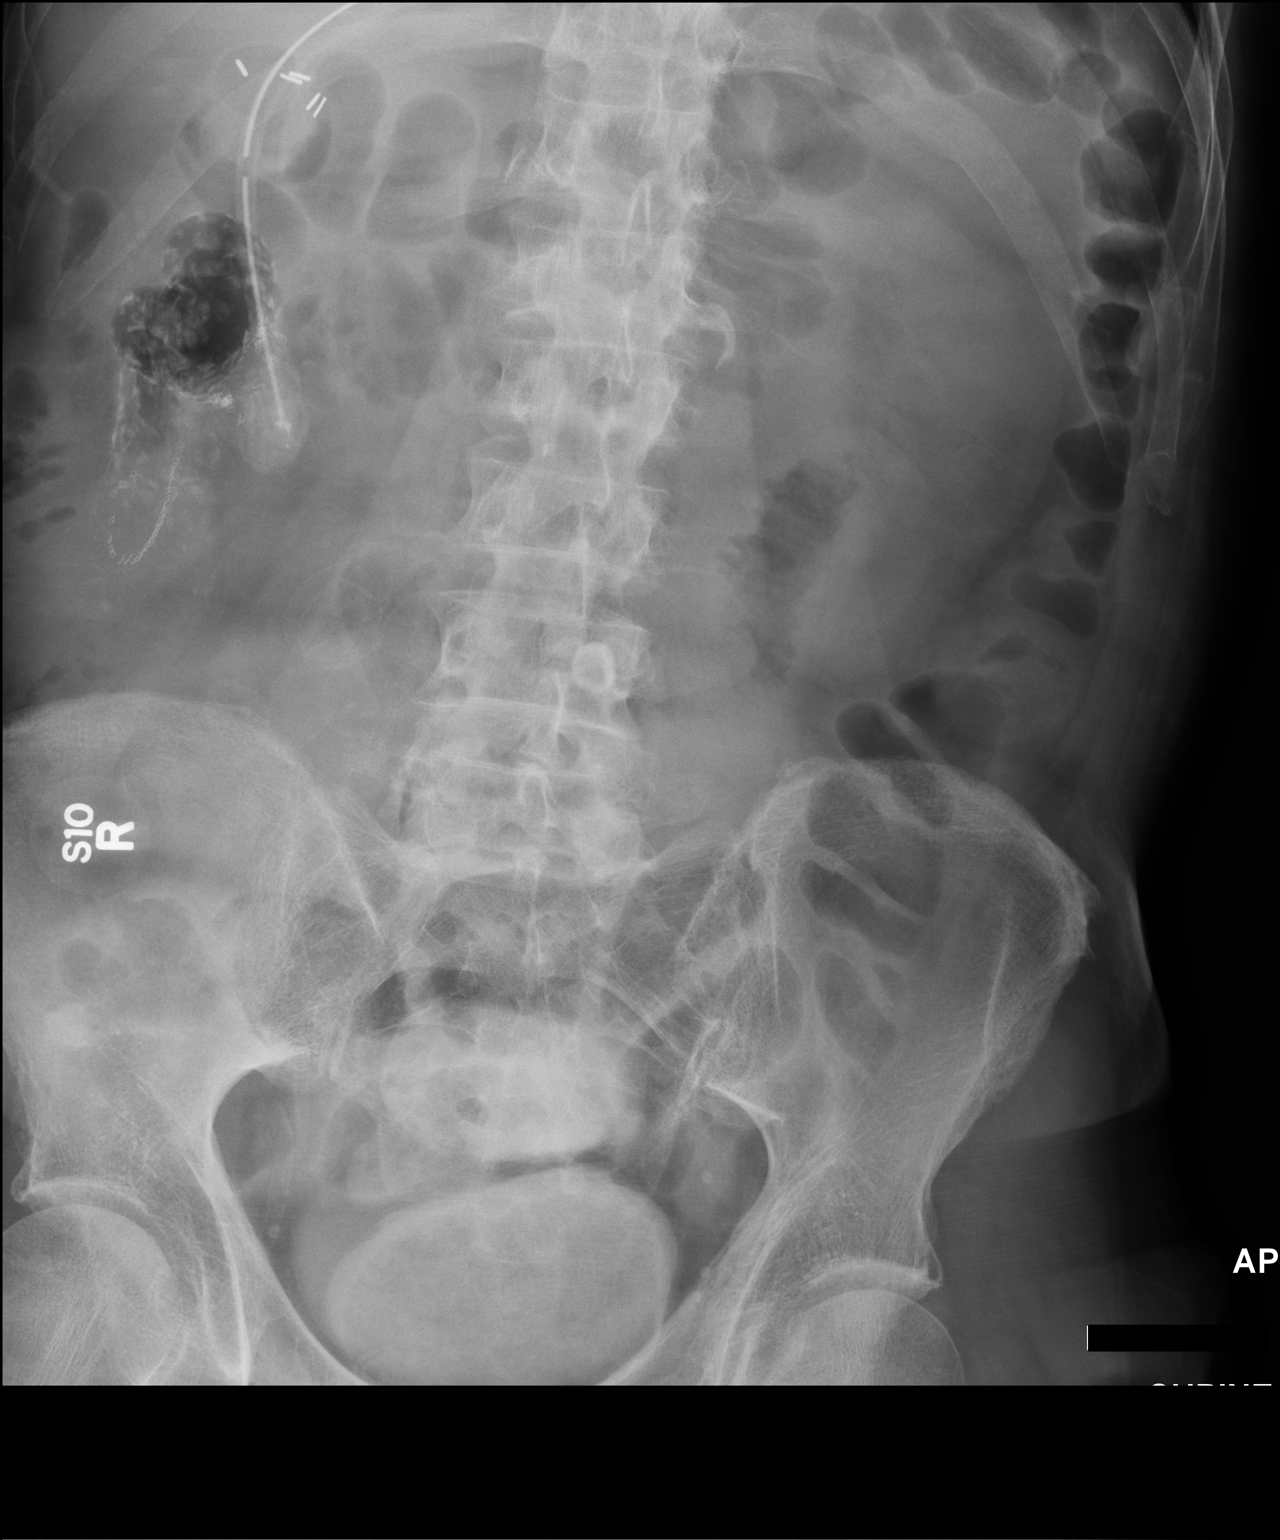

[AP (2 of 2)]
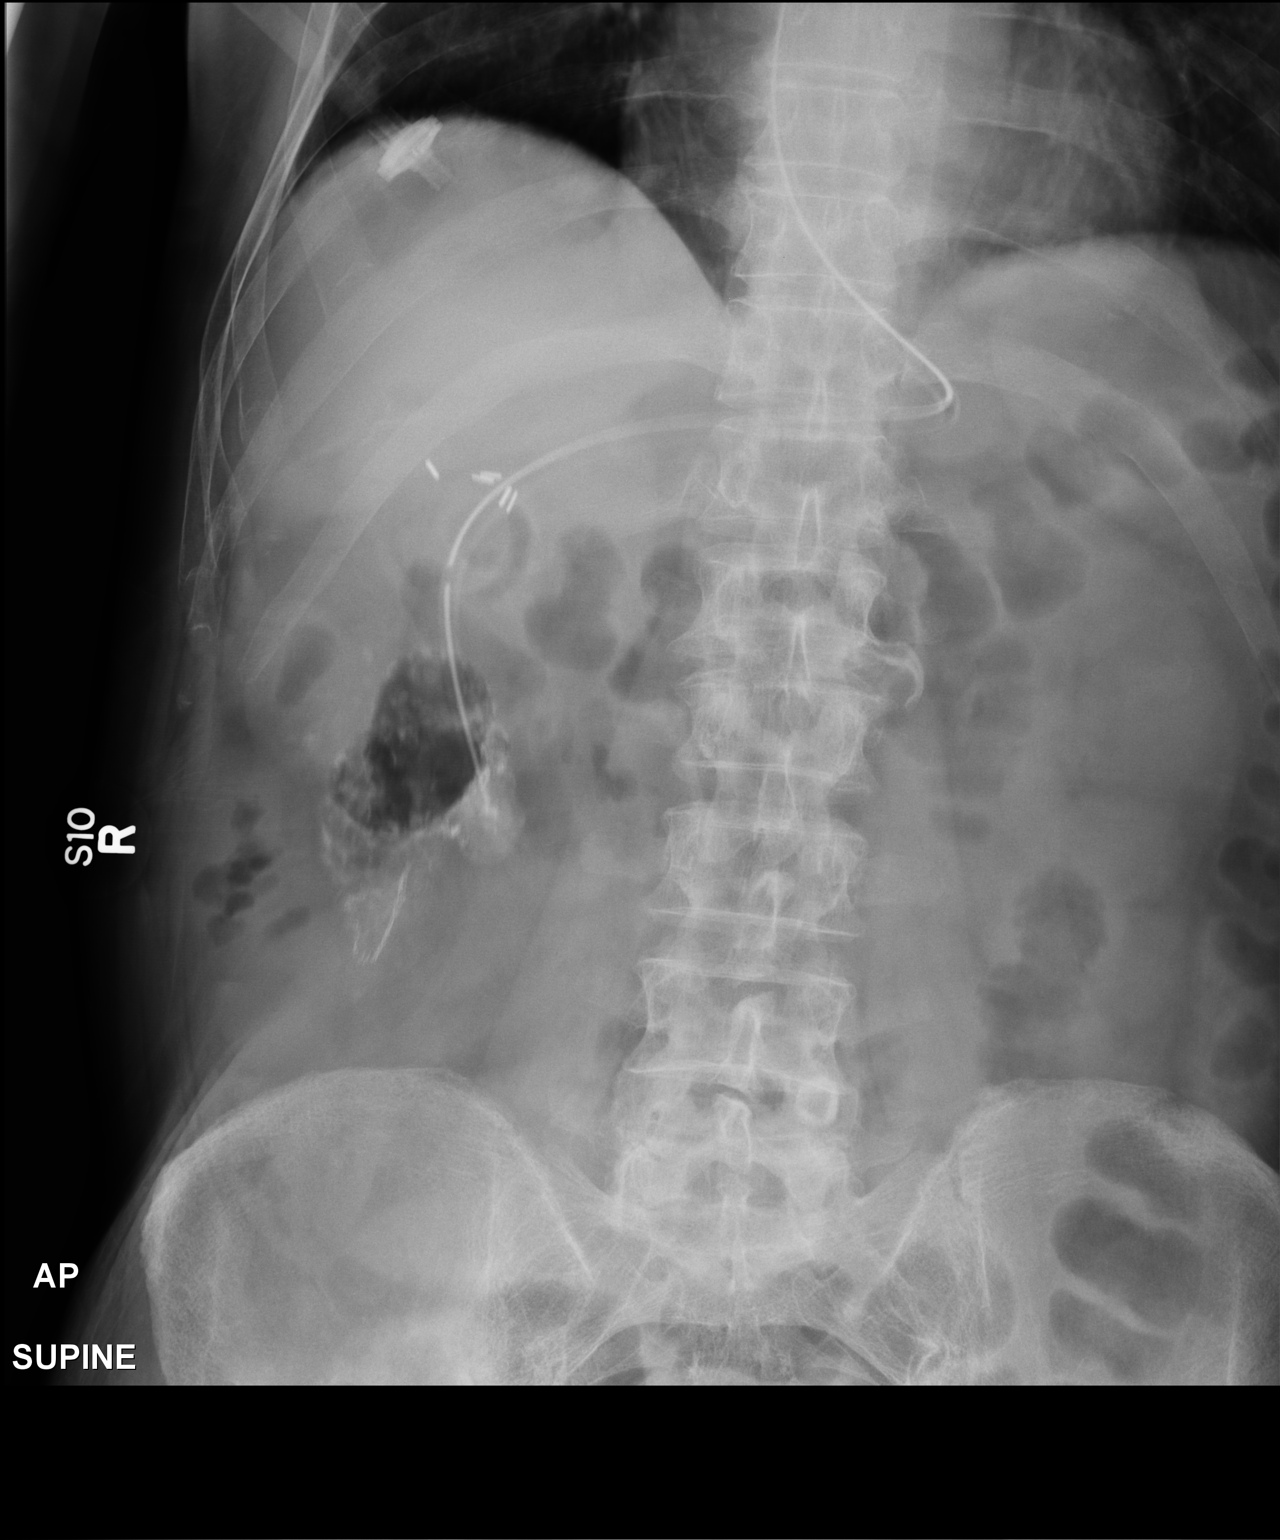

[2 of 2 positions shown; findings below may reference images not displayed]

FINDINGS: NG tube tip and side-port project over descending duodenum. Right
upper quadrant abdominal surgical clips. Re- demonstrated multiple
calcifications within mid abdomen. Gas is demonstrated within the
colon. Postsurgical changes right hemi abdomen. No definite gaseous
distended loops of small bowel identified.
IMPRESSION: No definite gaseous distended loops of small bowel identified to
suggest obstruction.

Stool is present within the colon.

## 2016-11-01 ENCOUNTER — Encounter: Payer: Self-pay | Admitting: Gastroenterology

## 2017-01-16 ENCOUNTER — Encounter: Payer: Self-pay | Admitting: Gastroenterology

## 2017-01-16 ENCOUNTER — Ambulatory Visit (INDEPENDENT_AMBULATORY_CARE_PROVIDER_SITE_OTHER): Payer: Medicare Other | Admitting: Gastroenterology

## 2017-01-16 VITALS — BP 126/82 | HR 88 | Ht 70.5 in | Wt 167.2 lb

## 2017-01-16 DIAGNOSIS — K50019 Crohn's disease of small intestine with unspecified complications: Secondary | ICD-10-CM

## 2017-01-16 DIAGNOSIS — Z86010 Personal history of colonic polyps: Secondary | ICD-10-CM | POA: Diagnosis not present

## 2017-01-16 NOTE — Progress Notes (Signed)
HPI :  Crohns history: 82 y/o male with a longstanding history of stricturing ileal Crohns disease. He also has a remote history of prostate cancer in remission, and TIA. He was previously followed by Dr. Olevia Perches. He was diagnosed when he was in his early 69s. He has had 2 prior small bowel resections (ileocectomy and ileal resection per patient, in [place]). He has been hospitalized in 2014 and December 2016 for bowel obstructions which resolved with bowel rest. He has been on prednisone over the years intermittently. He hadbeen on 6MP for several years at low dose, this was stopped in 2017. He had another bowel obstruction just proximal to the surgical anastomosis in Feb 2018 which responded to conservative therapy.   SINCE LAST VISIT:  Patient here for a follow up visit. Patient was unfortunately admitted to the hospital in February 2018 for a recurrent small bowel obstruction, thought to orginate just proximal to the ileocolonic surgical anastomosis based on CT scan. He resolved his symptoms with bowel rest and conservative therapy.   Since that time he has been feeling really well. He eats well, no nausea / vomiting. No abdominal pains. No weight loss. His bowel habits are normal, he has one formed BM per day. He is taking lomotil PRN, takes most days of the week to keep him regular. He denies any complaints. His wife was recently hospitalized for a major surgery which has added stress to his life. He is taking iron supplementation. Labs Hgb in February at time of admission in our records, but he thinks he has had labs in the past several months.   We otherwise discussed the results of his prior colonoscopies as below. He had 3 colonoscopies in 2017 for removal of large TVA that was scarred down from prior polypectomy attempt in 2014. He is fairly adament he does not wish to have any colonoscopies at this time of year.   Endoscopic history: Colonoscopy10/2014 - large tubulovilloous  adenoma and another adenoma, one in ascending and one in sigmoid, not entirely removed given and attempted to be removed in piecemeal per Dr. Olevia Perches Colonoscopy 05/31/15 - 4 cm right sided polyp (not attempted to be removed at [facility]), nodularity at anastomosis - bx show TVA Colonoscopy 07/24/15 - EMR of large right sided polyp, fair prep Colonoscopy 12/12/15 - stenosis of surgical anastomosis but no inflammatory changes, 1cm anastomotic polyp removed path c/w adenoma, small area of residual polyp at EMR site removed with snare and treated with APC, path c/w adenoma   Past Medical History:  Diagnosis Date  . Allergy   . Cataract    been removed   . Crohn disease ([place])   . Dehydration 03/01/2013   Diarrhea due to crohn's disease.   . Diverticulosis   . GERD (gastroesophageal reflux disease)   . Glaucoma   . History of adenomatous polyp of colon   . Hyperhomocysteinemia ([place])   . Hyperlipidemia   . PONV (postoperative nausea and vomiting)    "due to lots of mucus and phelgm in throat"-all with colonoscopy and years ago with gall bladder surgery   . Prostate cancer [facility])    "watchful waiting"- rechecks every 3- 6 months- Dr. Alinda Money  . SBO (small bowel obstruction) ([place]) 03/17/2014  . Small bowel obstruction ([place])   . TIA   . TIA (transient ischemic attack)    HX OF   . Vitamin B12 deficiency      Past Surgical History:  Procedure Laterality Date  .  APPENDECTOMY    . CATARACT EXTRACTION     both eyes  . CHOLECYSTECTOMY    . COLONOSCOPY    . POLYPECTOMY    . terminal ileum resection    . TONSILLECTOMY    . VASECTOMY     Family History  Problem Relation Age of Onset  . Crohn's disease Mother   . Heart disease Father   . Stroke Father   . Diabetes Paternal Grandfather   . Colon cancer Neg Hx   . Esophageal cancer Neg Hx   . Rectal cancer Neg Hx   . Stomach cancer Neg Hx   . Colon polyps Neg Hx    Social History   Tobacco Use  . Smoking status: Former Smoker    Types:  Cigarettes    Last attempt to quit: 07/16/1988    Years since quitting: 28.5  . Smokeless tobacco: Never Used  Substance Use Topics  . Alcohol use: Yes    Alcohol/week: 0.0 oz    Comment: occasionally to rare  . Drug use: No   Current Outpatient Medications  Medication Sig Dispense Refill  . aspirin EC 81 MG tablet Take 81 mg by mouth daily.    . B-12, Methylcobalamin, 1000 MCG SUBL Place 1,000 mcg under the tongue daily.     . calcium carbonate (TUMS - DOSED IN MG ELEMENTAL CALCIUM) 500 MG chewable tablet Chew 1 tablet by mouth daily as needed for indigestion or heartburn.     . diphenoxylate-atropine (LOMOTIL) 2.5-0.025 MG tablet Take 1-2 tablets by mouth daily as needed for diarrhea or loose stools. 60 tablet 3  . fenofibrate 54 MG tablet Take 54 mg by mouth daily.    . ferrous sulfate 325 (65 FE) MG tablet Take 325 mg by mouth daily with breakfast.     No current facility-administered medications for this visit.    No Known Allergies   Review of Systems: All systems reviewed and negative except where noted in HPI.   Labs obtained from primary care: 05/16/2016: - WBC 8.5, Hgb 14.0, plt 184 - normal BMET, Cr. 1.23 - LFTs normal  Physical Exam: BP 126/82 (BP Location: Left Arm, Patient Position: Sitting, Cuff Size: Normal)   Pulse 88 Comment: irregular  Ht 5' 10.5" (1.791 m)   Wt 167 lb 4 oz (75.9 kg)   BMI 23.66 kg/m  Constitutional: Pleasant,  male in no acute distress. HEENT: Normocephalic and atraumatic. Conjunctivae are normal. No scleral icterus. Neck supple.  Cardiovascular: Normal rate, regular rhythm.  Pulmonary/chest: Effort normal and breath sounds normal. No wheezing, rales or rhonchi. Abdominal: Soft, nondistended, nontender. There are no masses palpable. No hepatomegaly. Extremities: no edema Lymphadenopathy: No cervical adenopathy noted. Neurological: Alert and oriented to person place and time. Skin: Skin is warm and dry. No rashes noted. Psychiatric:  Normal mood and affect. Behavior is normal.   ASSESSMENT AND PLAN: 82 y/o male here for reassessment of the following issues:  Stricturing ileal Crohn's disease - see prior note for details of his case. He has had a history of obstructions due to fibrotic stricture at surgical anastomosis, 3 since 2014, last in Feb 2018, responded with bowel rest. He has not had any active Crohns on imaging or prior colonoscopy and has felt well in between episodes. We again discussed options which include continued monitoring, surgical therapy, or medical therapy. His preference is to avoid any medical therapy. If he did wish to have therapy, I think he may benefit most from a surgical resection  of the fibrotic stricture, but he again is adament he does not want any elective surgical intervention. He wishes to monitor for now. I obtained labs from earlier this year from primary care and all within normal limits. His vaccinations are up to date, had flu shot this year and pneumo vacc in recent years. He will see me again in 6-9 months for reassessment or call sooner with any questions / concerns.   Colon adenomas - large 4 cm right-sided adenoma, scarred down from polypectomy attempt in 2014. Due to this issue he has had 2 colonoscopies for EMR in 2017 and I do think this lesion has mostly been removed, although given technical difficultly due to scar tissue at the site there is a possibility for small focus of residual polyp after his last therapy. I had recommended a colonoscopy to be done again for surveillance, however he wishes to hold off until our next clinic visit. He understands potential risk of malignancy with residual polyp tissue.   [place] Cellar, MD [facility] Pager [phone]

## 2017-01-16 NOTE — Patient Instructions (Addendum)
If you are age 80 or older, your body mass index should be between 23-30. Your Body mass index is 23.66 kg/m. If this is out of the aforementioned range listed, please consider follow up with your Primary Care Provider.  If you are age 18 or younger, your body mass index should be between 19-25. Your Body mass index is 23.66 kg/m. If this is out of the aformentioned range listed, please consider follow up with your Primary Care Provider.   Dr. Havery Moros would like to see you back in 6 to 9 months. We will send you a reminder as we get closer to that time.   Thank you.

## 2017-01-18 ENCOUNTER — Other Ambulatory Visit: Payer: Self-pay | Admitting: Gastroenterology

## 2017-07-05 ENCOUNTER — Other Ambulatory Visit: Payer: Self-pay | Admitting: Gastroenterology

## 2017-08-15 ENCOUNTER — Encounter: Payer: Self-pay | Admitting: Gastroenterology

## 2017-09-06 ENCOUNTER — Other Ambulatory Visit: Payer: Self-pay | Admitting: Gastroenterology

## 2017-10-16 ENCOUNTER — Other Ambulatory Visit (INDEPENDENT_AMBULATORY_CARE_PROVIDER_SITE_OTHER): Payer: Medicare Other

## 2017-10-16 ENCOUNTER — Ambulatory Visit: Payer: Medicare Other | Admitting: Gastroenterology

## 2017-10-16 ENCOUNTER — Encounter: Payer: Self-pay | Admitting: Gastroenterology

## 2017-10-16 VITALS — BP 114/68 | HR 87 | Ht 71.0 in | Wt 165.2 lb

## 2017-10-16 DIAGNOSIS — K50019 Crohn's disease of small intestine with unspecified complications: Secondary | ICD-10-CM

## 2017-10-16 DIAGNOSIS — Z86010 Personal history of colonic polyps: Secondary | ICD-10-CM

## 2017-10-16 LAB — CBC WITH DIFFERENTIAL/PLATELET
BASOS ABS: 0 10*3/uL (ref 0.0–0.1)
Basophils Relative: 0.3 % (ref 0.0–3.0)
Eosinophils Absolute: 0.1 10*3/uL (ref 0.0–0.7)
Eosinophils Relative: 1.7 % (ref 0.0–5.0)
HEMATOCRIT: 39.5 % (ref 39.0–52.0)
Hemoglobin: 13.2 g/dL (ref 13.0–17.0)
LYMPHS PCT: 23.8 % (ref 12.0–46.0)
Lymphs Abs: 1.9 10*3/uL (ref 0.7–4.0)
MCHC: 33.5 g/dL (ref 30.0–36.0)
MCV: 88.6 fl (ref 78.0–100.0)
Monocytes Absolute: 0.9 10*3/uL (ref 0.1–1.0)
Monocytes Relative: 11 % (ref 3.0–12.0)
Neutro Abs: 5 10*3/uL (ref 1.4–7.7)
Neutrophils Relative %: 63.2 % (ref 43.0–77.0)
Platelets: 209 10*3/uL (ref 150.0–400.0)
RBC: 4.46 Mil/uL (ref 4.22–5.81)
RDW: 13.9 % (ref 11.5–15.5)
WBC: 8 10*3/uL (ref 4.0–10.5)

## 2017-10-16 LAB — COMPREHENSIVE METABOLIC PANEL
ALK PHOS: 40 U/L (ref 39–117)
ALT: 12 U/L (ref 0–53)
AST: 20 U/L (ref 0–37)
Albumin: 3.4 g/dL — ABNORMAL LOW (ref 3.5–5.2)
BILIRUBIN TOTAL: 1.1 mg/dL (ref 0.2–1.2)
BUN: 19 mg/dL (ref 6–23)
CALCIUM: 9 mg/dL (ref 8.4–10.5)
CO2: 27 mEq/L (ref 19–32)
Chloride: 107 mEq/L (ref 96–112)
Creatinine, Ser: 1.52 mg/dL — ABNORMAL HIGH (ref 0.40–1.50)
GFR: 46.95 mL/min — AB (ref >60.00)
Glucose, Bld: 90 mg/dL (ref 70–99)
Potassium: 3.9 mEq/L (ref 3.5–5.1)
Sodium: 142 mEq/L (ref 135–145)
Total Protein: 6.1 g/dL (ref 6.0–8.3)

## 2017-10-16 NOTE — Patient Instructions (Addendum)
If you are age 81 or older, your body mass index should be between 23-30. Your Body mass index is 23.05 kg/m. If this is out of the aforementioned range listed, please consider follow up with your Primary Care Provider.  If you are age 37 or younger, your body mass index should be between 19-25. Your Body mass index is 23.05 kg/m. If this is out of the aformentioned range listed, please consider follow up with your Primary Care Provider.   Please go to the lab in the basement of our building to have lab work done as you leave today.  We will call you to schedule your colonoscopy at the hospital when Dr. Doyne Keel October schedule opens.  Thank you for entrusting me with your care and for choosing [facility], Dr. [place] Cellar

## 2017-10-16 NOTE — Progress Notes (Signed)
HPI :  Crohns history: 83 y/o male with a longstanding history of stricturing ileal Crohns disease. He also has a remote history of prostate cancer in remission, and TIA. He was previously followed by Dr. Olevia Perches. He was diagnosed when he was in his early 64s. He has had 2 prior small bowel resections (ileocectomy and ileal resection per patient, in [place]). He has been hospitalized in 2014 and December 2016 for bowel obstructions which resolved with bowel rest. He has been on prednisone over the years intermittently. He hadbeen on 6MP for several years at low dose, this was stopped in 2017.He had another bowel obstruction just proximal to the surgical anastomosis in Feb 2018 which responded to conservative therapy.  SINCE LAST VISIT:  Patient is here for a follow up visit. He last had an issue with his bowels in Feb 2018, he was admitted with an SBO which was treated conservatively with bowel rest. No recurrence since then. He has not been on any therapy for his Chron's disease over time. He was last seen her in November 2018. He reports doing really well since our last visit. He is eating well. No nausea or vomiting. No postprandial abdominal pains. No weight loss. No bowel changes. He is having roughly 1 formed stool per day. No blood in the stools. He is exercising routinely and feeling well. He has no cardiopulmonary symptoms.   We otherwise discussed the results of his prior colonoscopies as below. He had 3 colonoscopies in 2017 for removal of large TVA that was scarred down from prior polypectomy attempt in 2014. He has declined follow up colonoscopies during our last visits, we discussed this again.  Endoscopic history: Colonoscopy10/2014 - large tubulovilloous adenoma and another adenoma, one in ascending and one in sigmoid, not entirely removed and attempted to be removed in piecemeal per Dr. Olevia Perches Colonoscopy 05/31/15 - 4 cm right sided polyp (not attempted to be removed at  [facility]), nodularity at anastomosis - bx show TVA Colonoscopy 07/24/15 - EMR of large right sided polyp, fair prep Colonoscopy 12/12/15 - stenosis of surgical anastomosis but no inflammatory changes, 1cm anastomotic polyp removed path c/w adenoma, small area of residual polyp at EMR site removed with snare and treated with APC, path c/w adenoma   Past Medical History:  Diagnosis Date  . Allergy   . Cataract    been removed   . Crohn disease ([place])   . Dehydration 03/01/2013   Diarrhea due to crohn's disease.   . Diverticulosis   . GERD (gastroesophageal reflux disease)   . Glaucoma   . History of adenomatous polyp of colon   . Hyperhomocysteinemia ([place])   . Hyperlipidemia   . PONV (postoperative nausea and vomiting)    "due to lots of mucus and phelgm in throat"-all with colonoscopy and years ago with gall bladder surgery   . Prostate cancer [facility])    "watchful waiting"- rechecks every 3- 6 months- Dr. Alinda Money  . SBO (small bowel obstruction) ([place]) 03/17/2014  . Small bowel obstruction ([place])   . TIA   . TIA (transient ischemic attack)    HX OF   . Vitamin B12 deficiency      Past Surgical History:  Procedure Laterality Date  . APPENDECTOMY    . CATARACT EXTRACTION     both eyes  . CHOLECYSTECTOMY    . COLONOSCOPY    . COLONOSCOPY WITH PROPOFOL N/A 07/24/2015   Procedure: COLONOSCOPY WITH PROPOFOL;  Surgeon: Manus Gunning, MD;  Location: [facility] ENDOSCOPY;  Service: Gastroenterology;  Laterality: N/A;  . COLONOSCOPY WITH PROPOFOL N/A 12/12/2015   Procedure: COLONOSCOPY WITH PROPOFOL;  Surgeon: Manus Gunning, MD;  Location: [facility] ENDOSCOPY;  Service: Gastroenterology;  Laterality: N/A;  . HOT HEMOSTASIS N/A 12/12/2015   Procedure: HOT HEMOSTASIS (ARGON PLASMA COAGULATION/BICAP);  Surgeon: Manus Gunning, MD;  Location: [facility];  Service: Gastroenterology;  Laterality: N/A;  . POLYPECTOMY    . terminal ileum resection    . TONSILLECTOMY    . VASECTOMY      Family History  Problem Relation Age of Onset  . Crohn's disease Mother   . Heart disease Father   . Stroke Father   . Diabetes Paternal Grandfather   . Colon cancer Neg Hx   . Esophageal cancer Neg Hx   . Rectal cancer Neg Hx   . Stomach cancer Neg Hx   . Colon polyps Neg Hx    Social History   Tobacco Use  . Smoking status: Former Smoker    Types: Cigarettes    Last attempt to quit: 07/16/1988    Years since quitting: 29.2  . Smokeless tobacco: Never Used  Substance Use Topics  . Alcohol use: Yes    Alcohol/week: 0.0 standard drinks    Comment: occasionally to rare  . Drug use: No   Current Outpatient Medications  Medication Sig Dispense Refill  . aspirin EC 81 MG tablet Take 81 mg by mouth daily.    . B-12, Methylcobalamin, 1000 MCG SUBL Place 1,000 mcg under the tongue daily.     . calcium carbonate (TUMS - DOSED IN MG ELEMENTAL CALCIUM) 500 MG chewable tablet Chew 1 tablet by mouth daily as needed for indigestion or heartburn.     . diphenoxylate-atropine (LOMOTIL) 2.5-0.025 MG tablet TAKE ONE TABLET 1 OR 2 TIMES DAILY AS NEEDED FOR DIARRHEA OR LOOSE STOOLS 60 tablet 0  . fenofibrate 54 MG tablet Take 54 mg by mouth daily.    . ferrous sulfate 325 (65 FE) MG tablet Take 325 mg by mouth daily with breakfast.     No current facility-administered medications for this visit.    No Known Allergies   Review of Systems: All systems reviewed and negative except where noted in HPI.   CBC Latest Ref Rng & Units 04/15/2016 04/14/2016 01/08/2016  WBC 4.0 - 10.5 K/uL 6.7 18.4(H) 7.5  Hemoglobin 13.0 - 17.0 g/dL 11.4(L) 14.4 13.3  Hematocrit 39.0 - 52.0 % 34.6(L) 43.2 39.2  Platelets 150 - 400 K/uL 168 222 213.0    Lab Results  Component Value Date   CREATININE 1.23 04/15/2016   BUN 20 04/15/2016   NA 146 (H) 04/15/2016   K 3.5 04/15/2016   CL 116 (H) 04/15/2016   CO2 24 04/15/2016    Lab Results  Component Value Date   ALT 23 04/14/2016   AST 35 04/14/2016    ALKPHOS 50 04/14/2016   BILITOT 1.2 04/14/2016     Physical Exam: Ht 5' 11"  (1.803 m)   Wt 165 lb 4 oz (75 kg)   BMI 23.05 kg/m  Constitutional: Pleasant,well-developed, male in no acute distress. HEENT: Normocephalic and atraumatic. Conjunctivae are normal. No scleral icterus. Neck supple.  Cardiovascular: Normal rate, regular rhythm.  Pulmonary/chest: Effort normal and breath sounds normal. No wheezing, rales or rhonchi. Abdominal: Soft, nondistended, nontender. There are no masses palpable. No hepatomegaly. Extremities: no edema Lymphadenopathy: No cervical adenopathy noted. Neurological: Alert and oriented to person place and time. Skin: Skin is  warm and dry. No rashes noted. Psychiatric: Normal mood and affect. Behavior is normal.   ASSESSMENT AND PLAN: 83 y/o male here for reassessment of the following issues:  Stricturing ileal Chron's disease - He has had a history of obstructions due to fibrotic stricture at surgical anastomosis, 3 since 2014, last in Feb 2018, responded with bowel rest. He has not had any active Crohns on imaging or prior colonoscopy and has felt well in between episodes. We have discussed options multiple times in the past which include continued monitoring, surgical therapy, or medical therapy. His preference is to avoid any medical therapy. He does not wish to have any elective surgical therapy. He's doing really well since I've last seen him, no issues since his obstruction in Feb 2018. He will continue monitoring for now, and let me know if there are any issues moving forward. Will check baseline CBC and CMET today, as it has been a while. Otherwise will survey his Chron's during colonoscopy, which he agreed to as below for surveillance of polyps.   History of colon polyps - large 4 cm right-sided adenoma, scarred down from polypectomy attempt in 2014. Due to this issue he has had 2 colonoscopies for EMR in 2017 and I do think this lesion has mostly been  removed, although given technical difficultlydue to scar tissue at the [place] is a possibility for small focus of residual polyp after his last therapy.I hadrecommendeda colonoscopy to be done again for surveillance, he had been reluctant previously however now agreeable for surveillance colonoscopy. This will be done at the hospital in case APC, multiple clips, etc are needed, as he required APC previously, he agreed. If this exam is normal, likely no further surveillance is needed.  [place] Cellar, MD [facility]  CC: Leighton Ruff, MD

## 2017-10-22 ENCOUNTER — Other Ambulatory Visit: Payer: Self-pay

## 2017-10-22 ENCOUNTER — Telehealth: Payer: Self-pay

## 2017-10-22 DIAGNOSIS — K50019 Crohn's disease of small intestine with unspecified complications: Secondary | ICD-10-CM

## 2017-10-22 DIAGNOSIS — Z86010 Personal history of colonic polyps: Secondary | ICD-10-CM

## 2017-10-22 NOTE — Telephone Encounter (Signed)
-----   Message from Roetta Sessions, [place] sent at 10/16/2017  9:02 AM EDT ----- Regarding: colon at the hospital in October When Dr. Doyne Keel October hospital schedule opens, schedule pt for colonoscopy and nurse previsit.

## 2017-10-22 NOTE — Progress Notes (Signed)
Orders for colon at [facility] on 12-16-17 at 9:30am, Dr. Havery Moros

## 2017-10-22 NOTE — Telephone Encounter (Signed)
Called pt. Scheduled for colon at [facility] on Tuesday, 12-16-17 at 9:30am, arrive at 8:00am.  Scheduled for previsit on Tuesday, 12-02-17 at 1:30pm. Pt expressed understanding.

## 2017-11-06 ENCOUNTER — Other Ambulatory Visit: Payer: Self-pay | Admitting: Gastroenterology

## 2017-11-06 NOTE — Telephone Encounter (Signed)
Ok to refill Lomotil? #60, any refills?

## 2017-11-06 NOTE — Telephone Encounter (Signed)
Yes that's fine, you can give him a refill

## 2017-12-02 ENCOUNTER — Ambulatory Visit (AMBULATORY_SURGERY_CENTER): Payer: Self-pay

## 2017-12-02 VITALS — Ht 71.0 in | Wt 167.8 lb

## 2017-12-02 DIAGNOSIS — K50019 Crohn's disease of small intestine with unspecified complications: Secondary | ICD-10-CM

## 2017-12-02 MED ORDER — NA SULFATE-K SULFATE-MG SULF 17.5-3.13-1.6 GM/177ML PO SOLN: 1.0000 | Freq: Once | ORAL | 0 refills | Status: AC

## 2017-12-02 NOTE — Progress Notes (Signed)
Denies allergies to eggs or soy products. Denies complication of anesthesia or sedation. Denies use of weight loss medication. Denies use of O2.   Emmi instructions declined.  

## 2017-12-11 ENCOUNTER — Encounter (HOSPITAL_COMMUNITY): Payer: Self-pay | Admitting: *Deleted

## 2017-12-11 ENCOUNTER — Other Ambulatory Visit: Payer: Self-pay

## 2017-12-12 ENCOUNTER — Other Ambulatory Visit: Payer: Self-pay

## 2017-12-16 ENCOUNTER — Encounter (HOSPITAL_COMMUNITY): Payer: Self-pay | Admitting: Emergency Medicine

## 2017-12-16 ENCOUNTER — Ambulatory Visit (HOSPITAL_COMMUNITY)
Admission: RE | Admit: 2017-12-16 | Discharge: 2017-12-16 | Disposition: A | Discharge status: Home / Self Care | Payer: Medicare Other | Source: Ambulatory Visit | Attending: Gastroenterology | Admitting: Gastroenterology

## 2017-12-16 ENCOUNTER — Ambulatory Visit (HOSPITAL_COMMUNITY): Payer: Medicare Other | Admitting: Certified Registered Nurse Anesthetist

## 2017-12-16 ENCOUNTER — Encounter (HOSPITAL_COMMUNITY)
Admission: RE | Disposition: A | Discharge status: Home / Self Care | Payer: Self-pay | Source: Ambulatory Visit | Attending: Gastroenterology

## 2017-12-16 ENCOUNTER — Other Ambulatory Visit: Payer: Self-pay

## 2017-12-16 DIAGNOSIS — K56699 Other intestinal obstruction unspecified as to partial versus complete obstruction: Secondary | ICD-10-CM | POA: Insufficient documentation

## 2017-12-16 DIAGNOSIS — K589 Irritable bowel syndrome without diarrhea: Secondary | ICD-10-CM | POA: Insufficient documentation

## 2017-12-16 DIAGNOSIS — Z7982 Long term (current) use of aspirin: Secondary | ICD-10-CM | POA: Insufficient documentation

## 2017-12-16 DIAGNOSIS — E785 Hyperlipidemia, unspecified: Secondary | ICD-10-CM | POA: Insufficient documentation

## 2017-12-16 DIAGNOSIS — E538 Deficiency of other specified B group vitamins: Secondary | ICD-10-CM | POA: Diagnosis not present

## 2017-12-16 DIAGNOSIS — Z86010 Personal history of colonic polyps: Secondary | ICD-10-CM | POA: Insufficient documentation

## 2017-12-16 DIAGNOSIS — Z87891 Personal history of nicotine dependence: Secondary | ICD-10-CM | POA: Insufficient documentation

## 2017-12-16 DIAGNOSIS — Z1211 Encounter for screening for malignant neoplasm of colon: Secondary | ICD-10-CM | POA: Insufficient documentation

## 2017-12-16 DIAGNOSIS — Z8546 Personal history of malignant neoplasm of prostate: Secondary | ICD-10-CM | POA: Diagnosis not present

## 2017-12-16 DIAGNOSIS — D123 Benign neoplasm of transverse colon: Secondary | ICD-10-CM | POA: Diagnosis not present

## 2017-12-16 DIAGNOSIS — E7211 Homocystinuria: Secondary | ICD-10-CM | POA: Insufficient documentation

## 2017-12-16 DIAGNOSIS — K50019 Crohn's disease of small intestine with unspecified complications: Secondary | ICD-10-CM

## 2017-12-16 DIAGNOSIS — Z79899 Other long term (current) drug therapy: Secondary | ICD-10-CM | POA: Diagnosis not present

## 2017-12-16 DIAGNOSIS — K219 Gastro-esophageal reflux disease without esophagitis: Secondary | ICD-10-CM | POA: Diagnosis not present

## 2017-12-16 DIAGNOSIS — Z8673 Personal history of transient ischemic attack (TIA), and cerebral infarction without residual deficits: Secondary | ICD-10-CM | POA: Diagnosis not present

## 2017-12-16 DIAGNOSIS — K50012 Crohn's disease of small intestine with intestinal obstruction: Secondary | ICD-10-CM | POA: Diagnosis not present

## 2017-12-16 DIAGNOSIS — D122 Benign neoplasm of ascending colon: Secondary | ICD-10-CM | POA: Insufficient documentation

## 2017-12-16 HISTORY — PX: COLONOSCOPY WITH PROPOFOL: SHX5780

## 2017-12-16 HISTORY — PX: BIOPSY: SHX5522

## 2017-12-16 SURGERY — COLONOSCOPY WITH PROPOFOL
Anesthesia: Monitor Anesthesia Care

## 2017-12-16 MED ORDER — LACTATED RINGERS IV SOLN
INTRAVENOUS | Status: DC
  Administered 2017-12-16: 08:00:00 via INTRAVENOUS

## 2017-12-16 MED ORDER — PROPOFOL 500 MG/50ML IV EMUL
INTRAVENOUS | Status: DC | PRN
  Administered 2017-12-16: 60 mg via INTRAVENOUS

## 2017-12-16 MED ORDER — LACTATED RINGERS IV SOLN: INTRAVENOUS | Status: DC

## 2017-12-16 MED ORDER — PROPOFOL 500 MG/50ML IV EMUL
INTRAVENOUS | Status: DC | PRN
  Administered 2017-12-16: 150 ug/kg/min via INTRAVENOUS

## 2017-12-16 MED ORDER — SODIUM CHLORIDE 0.9 % IV SOLN: INTRAVENOUS | Status: DC

## 2017-12-16 MED ORDER — PHENYLEPHRINE HCL 10 MG/ML IJ SOLN
INTRAMUSCULAR | Status: DC | PRN
  Administered 2017-12-16: 80 ug via INTRAVENOUS
  Administered 2017-12-16: 120 ug via INTRAVENOUS
  Administered 2017-12-16: 80 ug via INTRAVENOUS
  Administered 2017-12-16 (×3): 120 ug via INTRAVENOUS
  Administered 2017-12-16: 80 ug via INTRAVENOUS

## 2017-12-16 SURGICAL SUPPLY — 22 items

## 2017-12-16 NOTE — Anesthesia Preprocedure Evaluation (Signed)
Anesthesia Evaluation  Patient identified by MRN, date of birth, ID band Patient awake    Reviewed: Allergy & Precautions, NPO status , Patient's Chart, lab work & pertinent test results  History of Anesthesia Complications (+) PONV and history of anesthetic complications  Airway Mallampati: III       Dental  (+) Teeth Intact, Dental Advisory Given   Pulmonary neg shortness of breath, neg sleep apnea, neg COPD, neg recent URI, former smoker,    Pulmonary exam normal breath sounds clear to auscultation       Cardiovascular Exercise Tolerance: Good (-) hypertension(-) angina(-) Past MI, (-) Cardiac Stents, (-) CABG and (-) Orthopnea negative cardio ROS  (-) dysrhythmias  Rhythm:Regular Rate:Normal  HLD   Neuro/Psych neg Seizures TIA (about 12 years ago)   GI/Hepatic Neg liver ROS, Bowel prep,GERD  Controlled,Crohn's disease, diverticulosis   Endo/Other  negative endocrine ROS  Renal/GU negative Renal ROS     Musculoskeletal   Abdominal   Peds  Hematology negative hematology ROS (+)   Anesthesia Other Findings Prostate cancer, glaucoma, vitamin B12 deficiency, hyperhomocysteinemia  Reproductive/Obstetrics                             Anesthesia Physical  Anesthesia Plan  ASA: II  Anesthesia Plan: MAC   Post-op Pain Management:    Induction: Intravenous  PONV Risk Score and Plan:   Airway Management Planned: Natural Airway and Nasal Cannula  Additional Equipment:   Intra-op Plan:   Post-operative Plan:   Informed Consent: I have reviewed the patients History and Physical, chart, labs and discussed the procedure including the risks, benefits and alternatives for the proposed anesthesia with the patient or authorized representative who has indicated his/her understanding and acceptance.   Dental advisory given  Plan Discussed with: CRNA  Anesthesia Plan Comments:          Anesthesia Quick Evaluation

## 2017-12-16 NOTE — Anesthesia Postprocedure Evaluation (Signed)
Anesthesia Post Note  Patient: Ricardo Villa  Procedure(s) Performed: COLONOSCOPY WITH PROPOFOL (N/A ) BIOPSY     Patient location during evaluation: Endoscopy Anesthesia Type: MAC Level of consciousness: awake and alert Pain management: pain level controlled Vital Signs Assessment: post-procedure vital signs reviewed and stable Respiratory status: spontaneous breathing, nonlabored ventilation, respiratory function stable and patient connected to nasal cannula oxygen Cardiovascular status: stable and blood pressure returned to baseline Postop Assessment: no apparent nausea or vomiting Anesthetic complications: no    Last Vitals:  Vitals:   12/16/17 1040 12/16/17 1050  BP: 121/60 123/62  Pulse: 84 79  Resp: 15 18  Temp:    SpO2: 100% 92%    Last Pain:  Vitals:   12/16/17 1050  TempSrc:   PainSc: 0-No pain                 Montez Hageman

## 2017-12-16 NOTE — Discharge Instructions (Signed)

## 2017-12-16 NOTE — Transfer of Care (Signed)
Immediate Anesthesia Transfer of Care Note  Patient: Ricardo Villa  Procedure(s) Performed: COLONOSCOPY WITH PROPOFOL (N/A ) BIOPSY  Patient Location: PACU  Anesthesia Type:MAC  Level of Consciousness: sedated, patient cooperative and responds to stimulation  Airway & Oxygen Therapy: Patient Spontanous Breathing and Patient connected to face mask oxygen  Post-op Assessment: Report given to RN and Post -op Vital signs reviewed and stable  Post vital signs: Reviewed and stable  Last Vitals:  Vitals Value Taken Time  BP 87/35 12/16/2017 10:25 AM  Temp    Pulse 77 12/16/2017 10:27 AM  Resp 15 12/16/2017 10:27 AM  SpO2 100 % 12/16/2017 10:27 AM  Vitals shown include unvalidated device data.  Last Pain:  Vitals:   12/16/17 0818  TempSrc: Oral  PainSc: 0-No pain         Complications: No apparent anesthesia complications

## 2017-12-16 NOTE — Interval H&P Note (Signed)
History and Physical Interval Note:  12/16/2017 8:25 AM  Ricardo Villa  has presented today for surgery, with the diagnosis of crohn's/history of colon polyps/jmh  The various methods of treatment have been discussed with the patient and family. After consideration of risks, benefits and other options for treatment, the patient has consented to  Procedure(s): COLONOSCOPY WITH PROPOFOL (N/A) as a surgical intervention .  The patient's history has been reviewed, patient examined, no change in status, stable for surgery.  I have reviewed the patient's chart and labs.  Questions were answered to the patient's satisfaction.     [place]

## 2017-12-16 NOTE — Op Note (Signed)
[facility] Patient Name: Ricardo Villa Procedure Date: 12/16/2017 MRN: [id number] Attending MD: Carlota Raspberry. Havery Moros , MD Date of Birth: 08/06/1934 CSN: [id number] Age: 83 Admit Type: Inpatient Procedure:                Colonoscopy Indications:              High risk colon cancer surveillance: Personal                            history of colonic polyps (large polyp removed via                            EMR in the past), history of Crohn's disease with                            ileal stenosis Providers:                Remo Lipps P. Havery Moros, MD, Cleda Daub, RN,                            Alan Mulder, Technician, Herbie Drape, CRNA Referring MD:              Medicines:                Monitored Anesthesia Care Complications:            No immediate complications. Estimated blood loss:                            Minimal. Estimated Blood Loss:     Estimated blood loss was minimal. Procedure:                Pre-Anesthesia Assessment:                           - Prior to the procedure, a History and Physical                            was performed, and patient medications and                            allergies were reviewed. The patient's tolerance of                            previous anesthesia was also reviewed. The risks                            and benefits of the procedure and the sedation                            options and risks were discussed with the patient.                            All questions were answered, and informed consent  was obtained. Prior Anticoagulants: The patient has                            taken no previous anticoagulant or antiplatelet                            agents. ASA Grade Assessment: II - A patient with                            mild systemic disease. After reviewing the risks                            and benefits, the patient was deemed in                            satisfactory  condition to undergo the procedure.                           After obtaining informed consent, the colonoscope                            was passed under direct vision. Throughout the                            procedure, the patient's blood pressure, pulse, and                            oxygen saturations were monitored continuously. The                            [id number]-[id number] ([id number]) Olympus peds colonscope was                            introduced through the anus and advanced to the the                            ileocolonic anastomosis. The colonoscopy was                            performed without difficulty. The patient tolerated                            the procedure well. The quality of the bowel                            preparation was fair. The terminal ileum and the                            rectum, surgical anastomosis were photographed. Scope In: 9:44:20 AM Scope Out: 10:20:08 AM Scope Withdrawal Time: 0 hours 32 minutes 12 seconds  Total Procedure Duration: 0 hours 35 minutes 48 seconds  Findings:      The perianal and digital rectal examinations were normal.      A surgical anastomosis was noted in  the right colon. The terminal ileum       contained a benign-appearing, intrinsic severe stenosis that was       non-traversed. No active inflammatory changes were noted. Biopsies were       taken with a cold forceps for histology.      The surgical anastomosis area had a fair prep and was quite angulated.       Several minutes were spent lavaging this area. On the back side of the       anastomosis across from the ileal opening, the mucosa appeared congested       with either normal variant changes versus ? flat adenomatous change,       although clear margins were not able to be easily seen. This was near an       area of tattoo placed in the past where polypectomy was performed.       Unclear if this is normal variant near the anastomosis versus flat       polyp.  Biopsies were taken with a cold forceps for histology.      A large post polypectomy scar was found at the hepatic flexure. No       obvious residual polypoid tissue noted but biopsies were taken with a       cold forceps for histology.      There was spasm in the entire colon. This, along with time taken to       lavage the colon, prolonged the procedure. Mostly adequate views were       obtained in the entire colon following lavage.      The exam was otherwise normal throughout the examined colon. Impression:               - Preparation of the colon was fair, several                            minutes spent lavaging the colon.                           - Stricture in the terminal ileum, not able to be                            traversed, fibrotic. Biopsied.                           - Mucosa near the surgical anastomosis as outlined                            - unclear if normal variant versus flat polypoid                            lesion. Biopsied.                           - Post-polypectomy scar at the hepatic flexure.                            Biopsied.                           - Colonic  spasm. Moderate Sedation:      No moderate sedation, case performed with MAC Recommendation:           - Patient has a contact number available for                            emergencies. The signs and symptoms of potential                            delayed complications were discussed with the                            patient. Return to normal activities tomorrow.                            Written discharge instructions were provided to the                            patient.                           - Resume previous diet.                           - Continue present medications.                           - Await pathology results with further                            recommendations Procedure Code(s):        --- Professional ---                           [phone], Colonoscopy,  flexible; with biopsy, single                            or multiple Diagnosis Code(s):        --- Professional ---                           Z86.010, Personal history of colonic polyps                           K58.9, Irritable bowel syndrome without diarrhea                           K56.699, Other intestinal obstruction unspecified                            as to partial versus complete obstruction                           K63.89, Other specified diseases of intestine                           Z98.890, Other specified postprocedural states CPT copyright 2018 [employer]. All  rights reserved. The codes documented in this report are preliminary and upon coder review may  be revised to meet current compliance requirements. Remo Lipps P. [place]br[place], MD 12/16/2017 10:33:07 AM This report has been signed electronically. Number of Addenda: 0

## 2017-12-16 NOTE — H&P (Signed)
HPI:   Ricardo Villa is a 83 y.o. male with a history of Crohn's disease of the small bowel and large adenomatous polyps, here for a surveillance colonoscopy. Currently on no therapy for Crohn's and feeling well. He has a known stenosis at the ileum. Prior colonoscopies as outlined below, here for surveillance exam today to assess for recurrent polyps. He denies any pains, feeling well.   Endoscopic history: Colonoscopy10/2014 - large tubulovilloous adenoma and another adenoma, one in ascending and one in sigmoid, not entirely removed and attempted to be removed in piecemeal per Dr. Olevia Perches Colonoscopy 05/31/15 - 4 cm right sided polyp (not attempted to be removedat [facility]), nodularity at anastomosis - bx show TVA Colonoscopy 07/24/15 - EMR of large right sided polyp, fair prep Colonoscopy 12/12/15 - stenosis of surgical anastomosis but no inflammatory changes, 1cm anastomotic polyp removed path c/w adenoma, small area of residual polyp at EMR site removed with snare and treated with APC, path c/w adenoma    Past Medical History:  Diagnosis Date  . Allergy   . Cataract    been removed   . Crohn disease ([place])   . Dehydration 03/01/2013   Diarrhea due to crohn's disease.   . Diverticulosis   . GERD (gastroesophageal reflux disease)   . Glaucoma   . History of adenomatous polyp of colon   . Hyperhomocysteinemia ([place])   . Hyperlipidemia   . PONV (postoperative nausea and vomiting)    "due to lots of mucus and phelgm in throat"-all with colonoscopy and years ago with gall bladder surgery   . Prostate cancer [facility])    "watchful waiting"- rechecks every 3- 6 months- Dr. Alinda Money  . SBO (small bowel obstruction) ([place]) 03/17/2014  . Small bowel obstruction ([place])   . TIA    mini stroke    . TIA (transient ischemic attack)    HX OF   . Vitamin B12 deficiency     Past Surgical History:  Procedure Laterality Date  . APPENDECTOMY    . CATARACT EXTRACTION     both eyes    . CHOLECYSTECTOMY    . COLONOSCOPY    . COLONOSCOPY WITH PROPOFOL N/A 07/24/2015   Procedure: COLONOSCOPY WITH PROPOFOL;  Surgeon: Manus Gunning, MD;  Location: [facility];  Service: Gastroenterology;  Laterality: N/A;  . COLONOSCOPY WITH PROPOFOL N/A 12/12/2015   Procedure: COLONOSCOPY WITH PROPOFOL;  Surgeon: Manus Gunning, MD;  [facility];  Service: Gastroenterology;  Laterality: N/A;  . HOT HEMOSTASIS N/A 12/12/2015   Procedure: HOT HEMOSTASIS (ARGON PLASMA COAGULATION/BICAP);  Surgeon: Manus Gunning, MD;  Location: [facility];  Service: Gastroenterology;  Laterality: N/A;  . POLYPECTOMY    . terminal ileum resection    . TONSILLECTOMY    . VASECTOMY      Family History  Problem Relation Age of Onset  . Crohn's disease Mother   . Heart disease Father   . Stroke Father   . Diabetes Paternal Grandfather   . Colon cancer Neg Hx   . Esophageal cancer Neg Hx   . Rectal cancer Neg Hx   . Stomach cancer Neg Hx   . Colon polyps Neg Hx      Social History   Tobacco Use  . Smoking status: Former Smoker    Types: Cigarettes    Last attempt to quit: 07/16/1988    Years since quitting: 29.4  .  Smokeless tobacco: Never Used  Substance Use Topics  . Alcohol use: Yes    Alcohol/week: 0.0 standard drinks    Comment: occasionally to rare  . Drug use: No    Prior to Admission medications   Medication Sig Start Date End Date Taking? Authorizing Provider  aspirin EC 325 MG tablet Take 325 mg by mouth daily as needed for moderate pain.   Yes [provider]  aspirin EC 81 MG tablet Take 81 mg by mouth daily.   Yes [provider]  calcium carbonate (TUMS - DOSED IN MG ELEMENTAL CALCIUM) 500 MG chewable tablet Chew 1 tablet by mouth daily as needed for indigestion or heartburn.    Yes [provider]  diphenoxylate-atropine (LOMOTIL) 2.5-0.025 MG tablet TAKE ONE TABLET 1 OR 2 TIMES DAILY AS NEEDED FOR DIARRHEA OR LOOSE  STOOLS Patient taking differently: Take 1 tablet by mouth daily.  11/06/17  Yes Armbruster, Carlota Raspberry, MD  fenofibrate 54 MG tablet Take 54 mg by mouth daily.   Yes [provider]  guaiFENesin (MUCINEX) 600 MG 12 hr tablet Take 600 mg by mouth 2 (two) times daily as needed for cough or to loosen phlegm.   Yes [provider]  IRON PO Take 1 tablet by mouth daily.   Yes [provider]  vitamin B-12 (CYANOCOBALAMIN) 1000 MCG tablet Take 1,000 mcg by mouth every other day.   Yes [provider]    Current Facility-Administered Medications  Medication Dose Route Frequency Provider Last Rate Last Dose  . lactated ringers infusion   Intravenous Continuous Armbruster, Carlota Raspberry, MD        Allergies as of 10/22/2017  . (No Known Allergies)     Review of Systems:    As per HPI, otherwise negative    Physical Exam:  Vital signs in last 24 hours: Temp:  [98.1 F (36.7 C)] 98.1 F (36.7 C) (10/08 0818) Pulse Rate:  [94] 94 (10/08 0818) Resp:  [14] 14 (10/08 0818) BP: (158)/(68) 158/68 (10/08 0818) SpO2:  [100 %] 100 % (10/08 0818) Weight:  [78.9 kg] 78.9 kg (10/08 0818)   General:   Pleasant male in NAD Lungs:  Respirations even and unlabored. Lungs clear to auscultation bilaterally.   Heart:  Regular rate and rhythm; no MRG Abdomen:  Soft, nondistended, nontender. No appreciable masses or hepatomegaly.  Extremities:  Without edema.  Lab Results  Component Value Date   WBC 8.0 10/16/2017   HGB 13.2 10/16/2017   HCT 39.5 10/16/2017   MCV 88.6 10/16/2017   PLT 209.0 10/16/2017    Lab Results  Component Value Date   CREATININE 1.52 (H) 10/16/2017   BUN 19 10/16/2017   NA 142 10/16/2017   K 3.9 10/16/2017   CL 107 10/16/2017   CO2 27 10/16/2017    Lab Results  Component Value Date   ALT 12 10/16/2017   AST 20 10/16/2017   ALKPHOS 40 10/16/2017   BILITOT 1.1 10/16/2017       Impression / Plan:   83 y/o male with untreated Crohn's  disease, history of fibrotic ileal strictures, has been doing quite well over the past year. He also has a history of multiple large polyps, required EMR in 2017. Here for surveillance colonoscopy. I have discussed risks / benefits of colonoscopy and anesthesia and he wishes to proceed. Further recommendations pending the results.  Montgomery Cellar, MD [facility]enter[facility]

## 2018-01-01 ENCOUNTER — Telehealth: Payer: Self-pay

## 2018-01-01 NOTE — Telephone Encounter (Signed)
Pt has appt with Dr. Johney Maine  At [facility] on 01-08-18 at 10:15am. Patient has been informed

## 2018-01-02 NOTE — Telephone Encounter (Signed)
Thanks Jan, I have messaged Dr. Johney Maine about his case

## 2018-01-13 ENCOUNTER — Ambulatory Visit: Payer: Self-pay | Admitting: Surgery

## 2018-01-16 ENCOUNTER — Telehealth: Payer: Self-pay

## 2018-01-16 NOTE — Telephone Encounter (Signed)
   [facility] Pre-operative Risk Assessment    Request for surgical clearance:  1. What type of surgery is being performed?  COLON SURGERY   2. When is this surgery scheduled?  TBD   3. What type of clearance is required (medical clearance vs. Pharmacy clearance to hold med vs. Both)?  MEDICAL  4. Are there any medications that need to be held prior to surgery and how long?    5. Practice name and name of physician performing surgery?  [facility]/ DR GROSS   6. What is your office phone number [phone]    7.   What is your office fax number [phone]  8.   Anesthesia type (None, local, MAC, general) ?     Legrand Como  Rubylee Zamarripa 01/16/2018, 10:27 AM  _________________________________________________________________   (provider comments below)

## 2018-01-16 NOTE — Telephone Encounter (Signed)
Tried to reach [facility], surgeon's office, re: clearance. Noone answered in the nurse room. Sent Dr. Clyda Greener CMA, Illene Regulus, a message to call me.

## 2018-01-16 NOTE — Telephone Encounter (Signed)
   Primary Cardiologist:No primary care provider on file.  Chart reviewed as part of pre-operative protocol coverage. Because of Ricardo Villa's past medical history and time since last visit, he/she will require a follow-up visit in order to better assess preoperative cardiovascular risk.  Pre-op covering staff: - Please contact requesting surgeon's office via preferred method (i.e, phone, fax) to inform them that patient is not actively followed by [facility] - if they would like [place] to clear patient for surgery, they will need appointment beforehand - If surgeon's office still wants clearance, please arrange appointment   Charlie Pitter, PA-C  01/16/2018, 12:22 PM

## 2018-01-16 NOTE — Telephone Encounter (Signed)
Spoke with Alisha from [place]. She is going to send over new pt referral since pt has never been seen here and insurance may require it. Pt want have surgery until after 1st of next year. Will close out of preop pool.

## 2018-01-19 ENCOUNTER — Telehealth: Payer: Self-pay | Admitting: *Deleted

## 2018-01-19 NOTE — Telephone Encounter (Signed)
   [facility] Pre-operative Risk Assessment    Request for surgical clearance:  1. What type of surgery is being performed? PARTIAL COLECTOMY USING ROBOTIC/LAPARASCOPIC AND OPEN TECHNIQUES   2. When is this surgery scheduled? TBD; [place]   3. Are there any medications that need to be held prior to surgery and how long? ASA   4. Practice name and name of physician performing surgery? [facility]; DR. Johney Maine   5. What is your office phone and fax number? PH# [phone]; FAX # [phone]   5. Anesthesia type (None, local, MAC, general) ? GENERAL    Julaine Hua 01/19/2018, 4:22 PM  _________________________________________________________________   (provider comments below)

## 2018-01-20 NOTE — Telephone Encounter (Signed)
Pt will see Dr. Irish Lack 01/22/18 for clearance.

## 2018-01-22 ENCOUNTER — Ambulatory Visit: Payer: Medicare Other | Admitting: Interventional Cardiology

## 2018-01-22 ENCOUNTER — Encounter: Payer: Self-pay | Admitting: Interventional Cardiology

## 2018-01-22 VITALS — BP 120/78 | HR 71 | Ht 73.0 in | Wt 167.0 lb

## 2018-01-22 DIAGNOSIS — N183 Chronic kidney disease, stage 3 unspecified: Secondary | ICD-10-CM

## 2018-01-22 DIAGNOSIS — Z01810 Encounter for preprocedural cardiovascular examination: Secondary | ICD-10-CM | POA: Diagnosis not present

## 2018-01-22 DIAGNOSIS — I341 Nonrheumatic mitral (valve) prolapse: Secondary | ICD-10-CM

## 2018-01-22 NOTE — Progress Notes (Signed)
Cardiology Office Note   Date:  01/22/2018   ID:  [name], [name] 1935-01-21, MRN [id number]  PCP:  Leighton Ruff, MD    No chief complaint on file.  Preoperative cardiovascular evaluation  Wt Readings from Last 3 Encounters:  01/22/18 167 lb (75.8 kg)  12/16/17 174 lb (78.9 kg)  12/02/17 167 lb 12.8 oz (76.1 kg)       History of Present Illness: Ricardo Villa is a 83 y.o. male who is being seen today for the evaluation of preoperative risk at the request of Michael Boston, MD.  He requires colon surgery and has a h/o Crohn's disease.  He has a h/o TIA.  He has not had any cardiac evaluation in the past.  No stress test in the past.    Denies : Chest pain. Dizziness. Leg edema. Nitroglycerin use. Orthopnea. Palpitations. Paroxysmal nocturnal dyspnea. Shortness of breath. Syncope.   He walks and plays golf.  He goes 45 minutes including inclines.  No problems, no CP and no SHOB with walking up the incline.  He walks up stairs wothout any difficulty.  No HTN, DM, high cholesterol.  No heart trouble in his family.    He quit smoking 30 years ago.   Past Medical History:  Diagnosis Date  . Allergy   . Cataract    been removed   . Crohn disease ([place])   . Dehydration 03/01/2013   Diarrhea due to crohn's disease.   . Diverticulosis   . GERD (gastroesophageal reflux disease)   . Glaucoma   . History of adenomatous polyp of colon   . Hyperhomocysteinemia ([place])   . Hyperlipidemia   . PONV (postoperative nausea and vomiting)    "due to lots of mucus and phelgm in throat"-all with colonoscopy and years ago with gall bladder surgery   . Prostate cancer [facility])    "watchful waiting"- rechecks every 3- 6 months- Dr. Alinda Money  . SBO (small bowel obstruction) ([place]) 03/17/2014  . Small bowel obstruction ([place])   . TIA    mini stroke    . TIA (transient ischemic attack)    HX OF   . Vitamin B12 deficiency     Past Surgical History:  Procedure Laterality  Date  . APPENDECTOMY    . BIOPSY  12/16/2017   Procedure: BIOPSY;  Surgeon: Yetta Flock, MD;  [facility];  Service: Gastroenterology;;  . CATARACT EXTRACTION     both eyes  . CHOLECYSTECTOMY    . COLONOSCOPY    . COLONOSCOPY WITH PROPOFOL N/A 07/24/2015   Procedure: COLONOSCOPY WITH PROPOFOL;  Surgeon: Manus Gunning, MD;  [facility];  Service: Gastroenterology;  Laterality: N/A;  . COLONOSCOPY WITH PROPOFOL N/A 12/12/2015   Procedure: COLONOSCOPY WITH PROPOFOL;  Surgeon: Manus Gunning, MD;  [facility];  Service: Gastroenterology;  Laterality: N/A;  . COLONOSCOPY WITH PROPOFOL N/A 12/16/2017   Procedure: COLONOSCOPY WITH PROPOFOL;  Surgeon: Yetta Flock, MD;  [facility];  Service: Gastroenterology;  Laterality: N/A;  . HOT HEMOSTASIS N/A 12/12/2015   Procedure: HOT HEMOSTASIS (ARGON PLASMA COAGULATION/BICAP);  Surgeon: Manus Gunning, MD;  Location: [facility];  Service: Gastroenterology;  Laterality: N/A;  . POLYPECTOMY    . terminal ileum resection    . TONSILLECTOMY    . VASECTOMY       Current Outpatient Medications  Medication Sig Dispense Refill  . aspirin EC 325 MG tablet Take 325 mg by mouth daily as needed  for moderate pain.    Marland Kitchen aspirin EC 81 MG tablet Take 81 mg by mouth daily.    . calcium carbonate (TUMS - DOSED IN MG ELEMENTAL CALCIUM) 500 MG chewable tablet Chew 1 tablet by mouth daily as needed for indigestion or heartburn.     . diphenoxylate-atropine (LOMOTIL) 2.5-0.025 MG tablet TAKE ONE TABLET 1 OR 2 TIMES DAILY AS NEEDED FOR DIARRHEA OR LOOSE STOOLS (Patient taking differently: Take 1 tablet by mouth daily. ) 60 tablet 1  . fenofibrate 54 MG tablet Take 54 mg by mouth daily.    Marland Kitchen guaiFENesin (MUCINEX) 600 MG 12 hr tablet Take 600 mg by mouth 2 (two) times daily as needed for cough or to loosen phlegm.    . IRON PO Take 1 tablet by mouth daily.    . vitamin B-12 (CYANOCOBALAMIN)  1000 MCG tablet Take 1,000 mcg by mouth every other day.     No current facility-administered medications for this visit.     Allergies:   Patient has no known allergies.    Social History:  The patient  reports that he quit smoking about 29 years ago. His smoking use included cigarettes. He has never used smokeless tobacco. He reports that he drinks alcohol. He reports that he does not use drugs.   Family History:  The patient's family history includes Crohn's disease in his mother; Diabetes in his paternal grandfather; Heart disease in his father; Stroke in his father.    ROS:  Please see the history of present illness.   Otherwise, review of systems are positive for Gi issues related to bowel obstruction in the past.   All other systems are reviewed and negative.    PHYSICAL EXAM: VS:  BP 120/78   Pulse 71   Ht 6' 1"  (1.854 m)   Wt 167 lb (75.8 kg)   SpO2 99%   BMI 22.03 kg/m  , BMI Body mass index is 22.03 kg/m. GEN: Well nourished, well developed, in no acute distress  HEENT: normal  Neck: no JVD, carotid bruits, or masses Cardiac: RRR; no murmurs, rubs, or gallops,no edema  Respiratory:  clear to auscultation bilaterally, normal work of breathing GI: soft, nontender, nondistended, + BS MS: no deformity or atrophy  Skin: warm and dry, no rash Neuro:  Strength and sensation are intact Psych: euthymic mood, full affect   EKG:   The ekg ordered today demonstrates NSR, Nonspecific ST changes, no change from 2016 ECG   Recent Labs: 10/16/2017: ALT 12; BUN 19; Creatinine, Ser 1.52; Hemoglobin 13.2; Platelets 209.0; Potassium 3.9; Sodium 142   Lipid Panel No results found for: CHOL, TRIG, HDL, CHOLHDL, VLDL, LDLCALC, LDLDIRECT   Other studies Reviewed: Additional studies/ records that were reviewed today with results demonstrating: Labs reviewed , LDL 79 in 10/19.   ASSESSMENT AND PLAN:  1. Preoperative eval: He is very healthy and active without cardiac symptoms.  No  problems walking up stairs or walking uphill.  Given his very active lifestyle without cardiac symptoms, I do not think any provocative testing is required.  No further testing needed before GI surgery.  His biggest risk factor is his age which obviously cannot be modified. I think his overall risk is low and cannot be significantly reduced with testing.  If any problems arise in the perioperative period, please contact us.  I discussed the plan with the patient.  He is in agreement.  He would like to avoid any testing that is not absolutely necessary. 2.  Chronic renal insufficiency: Avoid nephrotoxins if possible. 3. Lipids and blood pressure well controlled. 4. MVP detected on 2007 echo.  No CHF sx noted.   Current medicines are reviewed at length with the patient today.  The patient concerns regarding his medicines were addressed.  The following changes have been made:  No change  Labs/ tests ordered today include:   Orders Placed This Encounter  Procedures  . EKG 12-Lead    Recommend 150 minutes/week of aerobic exercise Low fat, low carb, high fiber diet recommended  Disposition:   FU as needed   Signed, Larae Grooms, MD  01/22/2018 3:31 PM    [facility] [address], [place], [place]  [address] Phone: [phone]; Fax: [phone]

## 2018-01-22 NOTE — Patient Instructions (Addendum)
Medication Instructions:  Your physician recommends that you continue on your current medications as directed. Please refer to the Current Medication list given to you today.  If you need a refill on your cardiac medications before your next appointment, please call your pharmacy.   Lab work: None Ordered  If you have labs (blood work) drawn today and your tests are completely normal, you will receive your results only by: Marland Kitchen MyChart Message (if you have MyChart) OR . A paper copy in the mail If you have any lab test that is abnormal or we need to change your treatment, we will call you to review the results.  Testing/Procedures: None ordered  Follow-Up: . As needed  Any Other Special Instructions Will Be Listed Below (If Applicable).  You are cleared for your procedure. No further testing needed.

## 2018-01-27 ENCOUNTER — Ambulatory Visit: Payer: Self-pay | Admitting: Surgery

## 2018-03-07 ENCOUNTER — Other Ambulatory Visit: Payer: Self-pay | Admitting: Gastroenterology

## 2018-03-18 NOTE — Progress Notes (Signed)
01/22/2018- noted in Epic- EKG

## 2018-03-18 NOTE — Patient Instructions (Signed)
WANDELL SCULLION  03/18/2018   Your procedure is scheduled on: Wednesday 03/24/2018  Report to [facility] Main  Entrance              Report to admitting at  1005 AM    Call this number if you have problems the morning of surgery [phone]    Remember: Do not eat food  :After Midnight on Monday 03/23/2018.               Follow the Bowel Prep instructions from Dr. Johney [place] and drink clear liquids all day.  DRINK 2 PRESURGERY ENSURE DRINKS THE NIGHT BEFORE SURGERY AT  1000 PM AND 1 PRESURGERY DRINK THE DAY OF THE PROCEDURE 3 HOURS PRIOR TO SCHEDULED SURGERY. NO SOLIDS AFTER MIDNIGHT THE DAY PRIOR TO THE SURGERY. NOTHING BY MOUTH EXCEPT CLEAR LIQUIDS UNTIL THREE HOURS PRIOR TO SCHEDULED SURGERY. PLEASE FINISH PRESURGERY ENSURE DRINK PER SURGEON ORDER 3 HOURS PRIOR TO SCHEDULED SURGERY TIME WHICH NEEDS TO BE COMPLETED AT 0905 am.    CLEAR LIQUID DIET   Foods Allowed                                                                     Foods Excluded  Coffee and tea, regular and decaf                             liquids that you cannot  Plain Jell-O in any flavor                                             see through such as: Fruit ices (not with fruit pulp)                                     milk, soups, orange juice  Iced Popsicles                                    All solid food Carbonated beverages, regular and diet                                    Cranberry, grape and apple juices Sports drinks like Gatorade Lightly seasoned clear broth or consume(fat free) Sugar, honey syrup  Sample Menu Breakfast                                Lunch                                     Supper Cranberry juice  Beef broth                            Chicken broth Jell-O                                     Grape juice                           Apple juice Coffee or tea                        Jell-O                                      Popsicle                                          Coffee or tea                        Coffee or tea  _____________________________________________________________________               BRUSH YOUR TEETH MORNING OF SURGERY AND RINSE YOUR MOUTH OUT, NO CHEWING GUM CANDY OR MINTS.     Take these medicines the morning of surgery with A SIP OF WATER: Fenofibrate , use eye drops                                You may not have any metal on your body including hair pins and              piercings  Do not wear jewelry, make-up, lotions, powders or perfumes, deodorant                     Men may shave face and neck.   Do not bring valuables to the hospital. [place].  Contacts, dentures or bridgework may not be worn into surgery.  Leave suitcase in the car. After surgery it may be brought to your room.                  Please read over the following fact sheets you were given: _____________________________________________________________________             [facility] - Preparing for Surgery Before surgery, you can play an important role.  Because skin is not sterile, your skin needs to be as free of germs as possible.  You can reduce the number of germs on your skin by washing with CHG (chlorahexidine gluconate) soap before surgery.  CHG is an antiseptic cleaner which kills germs and bonds with the skin to continue killing germs even after washing. Please DO NOT use if you have an allergy to CHG or antibacterial soaps.  If your skin becomes reddened/irritated stop using the CHG and inform your nurse when you arrive at Short Stay. Do not shave (including legs and underarms) for at least 48 hours prior to the first  CHG shower.  You may shave your face/neck. Please follow these instructions carefully:  1.  Shower with CHG Soap the night before surgery and the  morning of Surgery.  2.  If you choose to wash your hair, wash your hair first as usual with  your  normal  shampoo.  3.  After you shampoo, rinse your hair and body thoroughly to remove the  shampoo.                           4.  Use CHG as you would any other liquid soap.  You can apply chg directly  to the skin and wash                       Gently with a scrungie or clean washcloth.  5.  Apply the CHG Soap to your body ONLY FROM THE NECK DOWN.   Do not use on face/ open                           Wound or open sores. Avoid contact with eyes, ears mouth and genitals (private parts).                       Wash face,  Genitals (private parts) with your normal soap.             6.  Wash thoroughly, paying special attention to the area where your surgery  will be performed.  7.  Thoroughly rinse your body with warm water from the neck down.  8.  DO NOT shower/wash with your normal soap after using and rinsing off  the CHG Soap.                9.  Pat yourself dry with a clean towel.            10.  Wear clean pajamas.            11.  Place clean sheets on your bed the night of your first shower and do not  sleep with pets. Day of Surgery : Do not apply any lotions/deodorants the morning of surgery.  Please wear clean clothes to the hospital/surgery center.  FAILURE TO FOLLOW THESE INSTRUCTIONS MAY RESULT IN THE CANCELLATION OF YOUR SURGERY PATIENT SIGNATURE_________________________________  NURSE SIGNATURE__________________________________  ________________________________________________________________________   Adam Phenix  An incentive spirometer is a tool that can help keep your lungs clear and active. This tool measures how well you are filling your lungs with each breath. Taking long deep breaths may help reverse or decrease the chance of developing breathing (pulmonary) problems (especially infection) following:  A long period of time when you are unable to move or be active. BEFORE THE PROCEDURE   If the spirometer includes an indicator to show your best effort,  your nurse or respiratory therapist will set it to a desired goal.  If possible, sit up straight or lean slightly forward. Try not to slouch.  Hold the incentive spirometer in an upright position. INSTRUCTIONS FOR USE  1. Sit on the edge of your bed if possible, or sit up as far as you can in bed or on a chair. 2. Hold the incentive spirometer in an upright position. 3. Breathe out normally. 4. Place the mouthpiece in your mouth and seal your lips tightly around it. 5. Breathe in slowly and as  deeply as possible, raising the piston or the ball toward the top of the column. 6. Hold your breath for 3-5 seconds or for as long as possible. Allow the piston or ball to fall to the bottom of the column. 7. Remove the mouthpiece from your mouth and breathe out normally. 8. Rest for a few seconds and repeat Steps 1 through 7 at least 10 times every 1-2 hours when you are awake. Take your time and take a few normal breaths between deep breaths. 9. The spirometer may include an indicator to show your best effort. Use the indicator as a goal to work toward during each repetition. 10. After each set of 10 deep breaths, practice coughing to be sure your lungs are clear. If you have an incision (the cut made at the time of surgery), support your incision when coughing by placing a pillow or rolled up towels firmly against it. Once you are able to get out of bed, walk around indoors and cough well. You may stop using the incentive spirometer when instructed by your caregiver.  RISKS AND COMPLICATIONS  Take your time so you do not get dizzy or light-headed.  If you are in pain, you may need to take or ask for pain medication before doing incentive spirometry. It is harder to take a deep breath if you are having pain. AFTER USE  Rest and breathe slowly and easily.  It can be helpful to keep track of a log of your progress. Your caregiver can provide you with a simple table to help with this. If you are  using the spirometer at home, follow these instructions: [place] IF:   You are having difficultly using the spirometer.  You have trouble using the spirometer as often as instructed.  Your pain medication is not giving enough relief while using the spirometer.  You develop fever of 100.5 F (38.1 C) or higher. SEEK IMMEDIATE MEDICAL CARE IF:   You cough up bloody sputum that had not been present before.  You develop fever of 102 F (38.9 C) or greater.  You develop worsening pain at or near the incision site. MAKE SURE YOU:   Understand these instructions.  Will watch your condition.  Will get help right away if you are not doing well or get worse. Document Released: 07/08/2006 Document Revised: 05/20/2011 Document Reviewed: 09/08/2006 [facility] Patient Information 2014 [place], [place].   ________________________________________________________________________

## 2018-03-19 ENCOUNTER — Other Ambulatory Visit: Payer: Self-pay

## 2018-03-19 ENCOUNTER — Encounter (HOSPITAL_COMMUNITY): Payer: Self-pay

## 2018-03-19 ENCOUNTER — Encounter (HOSPITAL_COMMUNITY)
Admission: RE | Admit: 2018-03-19 | Discharge: 2018-03-19 | Disposition: A | Discharge status: Home / Self Care | Payer: Medicare Other | Source: Ambulatory Visit | Attending: Surgery | Admitting: Surgery

## 2018-03-19 DIAGNOSIS — K635 Polyp of colon: Secondary | ICD-10-CM | POA: Insufficient documentation

## 2018-03-19 DIAGNOSIS — Z01812 Encounter for preprocedural laboratory examination: Secondary | ICD-10-CM | POA: Insufficient documentation

## 2018-03-19 DIAGNOSIS — K56609 Unspecified intestinal obstruction, unspecified as to partial versus complete obstruction: Secondary | ICD-10-CM | POA: Diagnosis not present

## 2018-03-19 LAB — CBC
HEMATOCRIT: 40.6 % (ref 39.0–52.0)
Hemoglobin: 12.5 g/dL — ABNORMAL LOW (ref 13.0–17.0)
MCH: 28.5 pg (ref 26.0–34.0)
MCHC: 30.8 g/dL (ref 30.0–36.0)
MCV: 92.7 fL (ref 80.0–100.0)
Platelets: 201 10*3/uL (ref 150–400)
RBC: 4.38 MIL/uL (ref 4.22–5.81)
RDW: 13.4 % (ref 11.5–15.5)
WBC: 7.7 10*3/uL (ref 4.0–10.5)
nRBC: 0 % (ref 0.0–0.2)

## 2018-03-19 LAB — BASIC METABOLIC PANEL
Anion gap: 4 — ABNORMAL LOW (ref 5–15)
BUN: 16 mg/dL (ref 8–23)
CO2: 28 mmol/L (ref 22–32)
Calcium: 8.4 mg/dL — ABNORMAL LOW (ref 8.9–10.3)
Chloride: 110 mmol/L (ref 98–111)
Creatinine, Ser: 1.39 mg/dL — ABNORMAL HIGH (ref 0.61–1.24)
GFR calc non Af Amer: 47 mL/min — ABNORMAL LOW (ref >60)
GFR, EST AFRICAN AMERICAN: 55 mL/min — AB (ref >60)
Glucose, Bld: 98 mg/dL (ref 70–99)
Potassium: 3.6 mmol/L (ref 3.5–5.1)
Sodium: 142 mmol/L (ref 135–145)

## 2018-03-19 LAB — HEMOGLOBIN A1C
Hgb A1c MFr Bld: 5.2 % (ref 4.8–5.6)
Mean Plasma Glucose: 102.54 mg/dL

## 2018-03-24 MED ORDER — BUPIVACAINE LIPOSOME 1.3 % IJ SUSP
20.0000 mL | Freq: Once | INTRAMUSCULAR | Status: DC
  Filled 2018-03-24: qty 20

## 2018-03-24 MED ORDER — SODIUM CHLORIDE 0.9 % IV SOLN
INTRAVENOUS | Status: DC
  Filled 2018-03-24: qty 6

## 2018-03-25 ENCOUNTER — Inpatient Hospital Stay (HOSPITAL_COMMUNITY): Payer: Medicare Other | Admitting: Certified Registered Nurse Anesthetist

## 2018-03-25 ENCOUNTER — Inpatient Hospital Stay (HOSPITAL_COMMUNITY)
Admission: RE | Admit: 2018-03-25 | Discharge: 2018-04-11 | DRG: 329 | Disposition: E | Payer: Medicare Other | Source: Other Acute Inpatient Hospital | Attending: Surgery | Admitting: Surgery

## 2018-03-25 ENCOUNTER — Inpatient Hospital Stay (HOSPITAL_COMMUNITY): Payer: Medicare Other | Admitting: Physician Assistant

## 2018-03-25 ENCOUNTER — Encounter (HOSPITAL_COMMUNITY): Payer: Self-pay | Admitting: General Practice

## 2018-03-25 ENCOUNTER — Encounter (HOSPITAL_COMMUNITY): Admission: RE | Disposition: E | Payer: Self-pay | Source: Other Acute Inpatient Hospital | Attending: Surgery

## 2018-03-25 DIAGNOSIS — K5090 Crohn's disease, unspecified, without complications: Secondary | ICD-10-CM | POA: Diagnosis present

## 2018-03-25 DIAGNOSIS — C61 Malignant neoplasm of prostate: Secondary | ICD-10-CM | POA: Diagnosis present

## 2018-03-25 DIAGNOSIS — N182 Chronic kidney disease, stage 2 (mild): Secondary | ICD-10-CM | POA: Diagnosis present

## 2018-03-25 DIAGNOSIS — E874 Mixed disorder of acid-base balance: Secondary | ICD-10-CM | POA: Diagnosis not present

## 2018-03-25 DIAGNOSIS — I469 Cardiac arrest, cause unspecified: Secondary | ICD-10-CM | POA: Diagnosis not present

## 2018-03-25 DIAGNOSIS — D509 Iron deficiency anemia, unspecified: Secondary | ICD-10-CM | POA: Diagnosis present

## 2018-03-25 DIAGNOSIS — N189 Chronic kidney disease, unspecified: Secondary | ICD-10-CM

## 2018-03-25 DIAGNOSIS — E872 Acidosis, unspecified: Secondary | ICD-10-CM | POA: Diagnosis not present

## 2018-03-25 DIAGNOSIS — R0603 Acute respiratory distress: Secondary | ICD-10-CM

## 2018-03-25 DIAGNOSIS — Z9841 Cataract extraction status, right eye: Secondary | ICD-10-CM | POA: Diagnosis not present

## 2018-03-25 DIAGNOSIS — Z66 Do not resuscitate: Secondary | ICD-10-CM | POA: Diagnosis not present

## 2018-03-25 DIAGNOSIS — K9189 Other postprocedural complications and disorders of digestive system: Secondary | ICD-10-CM | POA: Diagnosis present

## 2018-03-25 DIAGNOSIS — Z87891 Personal history of nicotine dependence: Secondary | ICD-10-CM

## 2018-03-25 DIAGNOSIS — K5669 Other partial intestinal obstruction: Secondary | ICD-10-CM | POA: Diagnosis present

## 2018-03-25 DIAGNOSIS — J9602 Acute respiratory failure with hypercapnia: Secondary | ICD-10-CM | POA: Diagnosis not present

## 2018-03-25 DIAGNOSIS — K219 Gastro-esophageal reflux disease without esophagitis: Secondary | ICD-10-CM | POA: Diagnosis present

## 2018-03-25 DIAGNOSIS — H409 Unspecified glaucoma: Secondary | ICD-10-CM | POA: Diagnosis present

## 2018-03-25 DIAGNOSIS — N179 Acute kidney failure, unspecified: Secondary | ICD-10-CM | POA: Diagnosis not present

## 2018-03-25 DIAGNOSIS — Z823 Family history of stroke: Secondary | ICD-10-CM

## 2018-03-25 DIAGNOSIS — R571 Hypovolemic shock: Secondary | ICD-10-CM | POA: Diagnosis not present

## 2018-03-25 DIAGNOSIS — G9341 Metabolic encephalopathy: Secondary | ICD-10-CM | POA: Diagnosis not present

## 2018-03-25 DIAGNOSIS — E785 Hyperlipidemia, unspecified: Secondary | ICD-10-CM | POA: Diagnosis present

## 2018-03-25 DIAGNOSIS — Z9842 Cataract extraction status, left eye: Secondary | ICD-10-CM

## 2018-03-25 DIAGNOSIS — N170 Acute kidney failure with tubular necrosis: Secondary | ICD-10-CM | POA: Diagnosis not present

## 2018-03-25 DIAGNOSIS — Z862 Personal history of diseases of the blood and blood-forming organs and certain disorders involving the immune mechanism: Secondary | ICD-10-CM

## 2018-03-25 DIAGNOSIS — Z7982 Long term (current) use of aspirin: Secondary | ICD-10-CM

## 2018-03-25 DIAGNOSIS — Z8719 Personal history of other diseases of the digestive system: Secondary | ICD-10-CM | POA: Diagnosis not present

## 2018-03-25 DIAGNOSIS — K56699 Other intestinal obstruction unspecified as to partial versus complete obstruction: Secondary | ICD-10-CM

## 2018-03-25 DIAGNOSIS — J9601 Acute respiratory failure with hypoxia: Secondary | ICD-10-CM | POA: Diagnosis not present

## 2018-03-25 DIAGNOSIS — R34 Anuria and oliguria: Secondary | ICD-10-CM | POA: Diagnosis not present

## 2018-03-25 DIAGNOSIS — Z9049 Acquired absence of other specified parts of digestive tract: Secondary | ICD-10-CM

## 2018-03-25 DIAGNOSIS — N183 Chronic kidney disease, stage 3 (moderate): Secondary | ICD-10-CM | POA: Diagnosis not present

## 2018-03-25 DIAGNOSIS — D122 Benign neoplasm of ascending colon: Secondary | ICD-10-CM | POA: Diagnosis present

## 2018-03-25 DIAGNOSIS — Z452 Encounter for adjustment and management of vascular access device: Secondary | ICD-10-CM

## 2018-03-25 DIAGNOSIS — K50812 Crohn's disease of both small and large intestine with intestinal obstruction: Secondary | ICD-10-CM

## 2018-03-25 DIAGNOSIS — Z978 Presence of other specified devices: Secondary | ICD-10-CM

## 2018-03-25 DIAGNOSIS — T8144XA Sepsis following a procedure, initial encounter: Secondary | ICD-10-CM | POA: Diagnosis not present

## 2018-03-25 DIAGNOSIS — R1110 Vomiting, unspecified: Secondary | ICD-10-CM

## 2018-03-25 DIAGNOSIS — Z833 Family history of diabetes mellitus: Secondary | ICD-10-CM

## 2018-03-25 DIAGNOSIS — T17908A Unspecified foreign body in respiratory tract, part unspecified causing other injury, initial encounter: Secondary | ICD-10-CM

## 2018-03-25 DIAGNOSIS — Z8673 Personal history of transient ischemic attack (TIA), and cerebral infarction without residual deficits: Secondary | ICD-10-CM

## 2018-03-25 DIAGNOSIS — Y832 Surgical operation with anastomosis, bypass or graft as the cause of abnormal reaction of the patient, or of later complication, without mention of misadventure at the time of the procedure: Secondary | ICD-10-CM | POA: Diagnosis present

## 2018-03-25 DIAGNOSIS — Z86010 Personal history of colonic polyps: Secondary | ICD-10-CM

## 2018-03-25 DIAGNOSIS — Z8249 Family history of ischemic heart disease and other diseases of the circulatory system: Secondary | ICD-10-CM

## 2018-03-25 DIAGNOSIS — I959 Hypotension, unspecified: Secondary | ICD-10-CM | POA: Diagnosis not present

## 2018-03-25 DIAGNOSIS — E538 Deficiency of other specified B group vitamins: Secondary | ICD-10-CM | POA: Diagnosis present

## 2018-03-25 DIAGNOSIS — K660 Peritoneal adhesions (postprocedural) (postinfection): Secondary | ICD-10-CM | POA: Diagnosis present

## 2018-03-25 DIAGNOSIS — E876 Hypokalemia: Secondary | ICD-10-CM | POA: Diagnosis not present

## 2018-03-25 HISTORY — DX: Acute kidney failure, unspecified: N17.9

## 2018-03-25 LAB — TYPE AND SCREEN
ABO/RH(D): A POS
Antibody Screen: NEGATIVE

## 2018-03-25 SURGERY — COLECTOMY, PARTIAL, ROBOT-ASSISTED, LAPAROSCOPIC
Anesthesia: General | Site: Abdomen

## 2018-03-25 MED ORDER — ROCURONIUM BROMIDE 10 MG/ML (PF) SYRINGE
PREFILLED_SYRINGE | INTRAVENOUS | Status: AC
  Filled 2018-03-25: qty 10

## 2018-03-25 MED ORDER — GABAPENTIN 300 MG PO CAPS
300.0000 mg | ORAL_CAPSULE | ORAL | Status: AC
  Administered 2018-03-25: 300 mg via ORAL
  Filled 2018-03-25: qty 1

## 2018-03-25 MED ORDER — PROPOFOL 10 MG/ML IV BOLUS
INTRAVENOUS | Status: DC | PRN
  Administered 2018-03-25: 140 mg via INTRAVENOUS

## 2018-03-25 MED ORDER — FENTANYL CITRATE (PF) 100 MCG/2ML IJ SOLN
INTRAMUSCULAR | Status: AC
  Filled 2018-03-25: qty 2

## 2018-03-25 MED ORDER — SUGAMMADEX SODIUM 200 MG/2ML IV SOLN
INTRAVENOUS | Status: AC
  Filled 2018-03-25: qty 2

## 2018-03-25 MED ORDER — SODIUM CHLORIDE 0.9 % IV SOLN
INTRAVENOUS | Status: DC | PRN
  Administered 2018-03-25: 25 ug/min via INTRAVENOUS

## 2018-03-25 MED ORDER — ONDANSETRON HCL 4 MG/2ML IJ SOLN
INTRAMUSCULAR | Status: DC | PRN
  Administered 2018-03-25: 4 mg via INTRAVENOUS

## 2018-03-25 MED ORDER — ENOXAPARIN SODIUM 40 MG/0.4ML ~~LOC~~ SOLN
40.0000 mg | Freq: Once | SUBCUTANEOUS | Status: AC
  Administered 2018-03-25: 40 mg via SUBCUTANEOUS
  Filled 2018-03-25: qty 0.4

## 2018-03-25 MED ORDER — ONDANSETRON HCL 4 MG/2ML IJ SOLN
INTRAMUSCULAR | Status: AC
  Filled 2018-03-25: qty 2

## 2018-03-25 MED ORDER — DEXAMETHASONE SODIUM PHOSPHATE 10 MG/ML IJ SOLN
INTRAMUSCULAR | Status: AC
  Filled 2018-03-25: qty 1

## 2018-03-25 MED ORDER — ROCURONIUM BROMIDE 10 MG/ML (PF) SYRINGE
PREFILLED_SYRINGE | INTRAVENOUS | Status: AC
  Filled 2018-03-25: qty 30

## 2018-03-25 MED ORDER — PROPOFOL 10 MG/ML IV BOLUS
INTRAVENOUS | Status: AC
  Filled 2018-03-25: qty 20

## 2018-03-25 MED ORDER — 0.9 % SODIUM CHLORIDE (POUR BTL) OPTIME
TOPICAL | Status: DC | PRN
  Administered 2018-03-25: 2000 mL

## 2018-03-25 MED ORDER — BISACODYL 5 MG PO TBEC
20.0000 mg | DELAYED_RELEASE_TABLET | Freq: Once | ORAL | Status: DC
  Filled 2018-03-25: qty 4

## 2018-03-25 MED ORDER — NEOMYCIN SULFATE 500 MG PO TABS: 1000.0000 mg | ORAL_TABLET | ORAL | Status: DC

## 2018-03-25 MED ORDER — ROCURONIUM BROMIDE 10 MG/ML (PF) SYRINGE
PREFILLED_SYRINGE | INTRAVENOUS | Status: DC | PRN
  Administered 2018-03-25: 10 mg via INTRAVENOUS
  Administered 2018-03-25: 100 mg via INTRAVENOUS
  Administered 2018-03-25: 20 mg via INTRAVENOUS

## 2018-03-25 MED ORDER — SUGAMMADEX SODIUM 200 MG/2ML IV SOLN
INTRAVENOUS | Status: DC | PRN
  Administered 2018-03-25: 200 mg via INTRAVENOUS

## 2018-03-25 MED ORDER — LIDOCAINE 2% (20 MG/ML) 5 ML SYRINGE
INTRAMUSCULAR | Status: AC
  Filled 2018-03-25: qty 5

## 2018-03-25 MED ORDER — LACTATED RINGERS IR SOLN
Status: DC | PRN
  Administered 2018-03-25: 1000 mL

## 2018-03-25 MED ORDER — FENTANYL CITRATE (PF) 250 MCG/5ML IJ SOLN
INTRAMUSCULAR | Status: AC
  Filled 2018-03-25: qty 5

## 2018-03-25 MED ORDER — LIDOCAINE 2% (20 MG/ML) 5 ML SYRINGE
INTRAMUSCULAR | Status: DC | PRN
  Administered 2018-03-25: 1 mg/kg/h via INTRAVENOUS

## 2018-03-25 MED ORDER — FENTANYL CITRATE (PF) 250 MCG/5ML IJ SOLN
INTRAMUSCULAR | Status: DC | PRN
  Administered 2018-03-25: 25 ug via INTRAVENOUS
  Administered 2018-03-25 (×6): 50 ug via INTRAVENOUS
  Administered 2018-03-25: 75 ug via INTRAVENOUS

## 2018-03-25 MED ORDER — LACTATED RINGERS IV SOLN
INTRAVENOUS | Status: DC
  Administered 2018-03-25 – 2018-03-26 (×4): via INTRAVENOUS

## 2018-03-25 MED ORDER — SODIUM CHLORIDE 0.9 % IV SOLN
INTRAVENOUS | Status: DC | PRN
  Administered 2018-03-25: 20:00:00 via INTRAVENOUS

## 2018-03-25 MED ORDER — PHENYLEPHRINE 40 MCG/ML (10ML) SYRINGE FOR IV PUSH (FOR BLOOD PRESSURE SUPPORT)
PREFILLED_SYRINGE | INTRAVENOUS | Status: AC
  Filled 2018-03-25: qty 10

## 2018-03-25 MED ORDER — POLYETHYLENE GLYCOL 3350 17 GM/SCOOP PO POWD: 1.0000 | Freq: Once | ORAL | Status: DC

## 2018-03-25 MED ORDER — SODIUM CHLORIDE 0.9 % IV SOLN
INTRAVENOUS | Status: DC | PRN
  Administered 2018-03-25: 1000 mL via INTRAPERITONEAL

## 2018-03-25 MED ORDER — BUPIVACAINE-EPINEPHRINE (PF) 0.25% -1:200000 IJ SOLN
INTRAMUSCULAR | Status: DC | PRN
  Administered 2018-03-25: 60 mL

## 2018-03-25 MED ORDER — METRONIDAZOLE 500 MG PO TABS: 1000.0000 mg | ORAL_TABLET | ORAL | Status: DC

## 2018-03-25 MED ORDER — MEPERIDINE HCL 50 MG/ML IJ SOLN: 6.2500 mg | INTRAMUSCULAR | Status: DC | PRN

## 2018-03-25 MED ORDER — METOPROLOL TARTRATE 5 MG/5ML IV SOLN
INTRAVENOUS | Status: AC
  Filled 2018-03-25: qty 5

## 2018-03-25 MED ORDER — SODIUM CHLORIDE 0.9 % IV SOLN
2.0000 g | INTRAVENOUS | Status: AC
  Administered 2018-03-25 (×2): 2 g via INTRAVENOUS
  Filled 2018-03-25: qty 2

## 2018-03-25 MED ORDER — BUPIVACAINE-EPINEPHRINE (PF) 0.25% -1:200000 IJ SOLN
INTRAMUSCULAR | Status: AC
  Filled 2018-03-25: qty 60

## 2018-03-25 MED ORDER — SODIUM CHLORIDE 0.9 % IV SOLN
INTRAVENOUS | Status: AC
  Filled 2018-03-25: qty 2

## 2018-03-25 MED ORDER — ACETAMINOPHEN 500 MG PO TABS
1000.0000 mg | ORAL_TABLET | ORAL | Status: AC
  Administered 2018-03-25: 1000 mg via ORAL
  Filled 2018-03-25: qty 2

## 2018-03-25 MED ORDER — METOCLOPRAMIDE HCL 5 MG/ML IJ SOLN: 10.0000 mg | Freq: Once | INTRAMUSCULAR | Status: DC | PRN

## 2018-03-25 MED ORDER — BUPIVACAINE LIPOSOME 1.3 % IJ SUSP
INTRAMUSCULAR | Status: DC | PRN
  Administered 2018-03-25: 20 mL

## 2018-03-25 MED ORDER — FENTANYL CITRATE (PF) 100 MCG/2ML IJ SOLN: 25.0000 ug | INTRAMUSCULAR | Status: DC | PRN

## 2018-03-25 MED ORDER — DEXAMETHASONE SODIUM PHOSPHATE 10 MG/ML IJ SOLN
INTRAMUSCULAR | Status: DC | PRN
  Administered 2018-03-25: 8 mg via INTRAVENOUS

## 2018-03-25 MED ORDER — ALVIMOPAN 12 MG PO CAPS
12.0000 mg | ORAL_CAPSULE | ORAL | Status: AC
  Administered 2018-03-25: 12 mg via ORAL
  Filled 2018-03-25: qty 1

## 2018-03-25 MED ORDER — PHENYLEPHRINE 40 MCG/ML (10ML) SYRINGE FOR IV PUSH (FOR BLOOD PRESSURE SUPPORT)
PREFILLED_SYRINGE | INTRAVENOUS | Status: DC | PRN
  Administered 2018-03-25: 120 ug via INTRAVENOUS

## 2018-03-25 MED ORDER — ALBUMIN HUMAN 5 % IV SOLN
INTRAVENOUS | Status: AC
  Filled 2018-03-25: qty 250

## 2018-03-25 MED ORDER — LIDOCAINE 2% (20 MG/ML) 5 ML SYRINGE
INTRAMUSCULAR | Status: DC | PRN
  Administered 2018-03-25: 60 mg via INTRAVENOUS
  Administered 2018-03-25: 80 mg via INTRAVENOUS

## 2018-03-25 MED ORDER — ALBUMIN HUMAN 5 % IV SOLN
INTRAVENOUS | Status: DC | PRN
  Administered 2018-03-25: 17:00:00 via INTRAVENOUS

## 2018-03-25 SURGICAL SUPPLY — 104 items
APPLIER CLIP 5 13 M/L LIGAMAX5 (MISCELLANEOUS)
APPLIER CLIP ROT 10 11.4 M/L (STAPLE)
APR CLP MED LRG 11.4X10 (STAPLE)
APR CLP MED LRG 5 ANG JAW (MISCELLANEOUS)
BLADE EXTENDED COATED 6.5IN (ELECTRODE) ×3 IMPLANT
CANNULA REDUC XI 12-8 STAPL (CANNULA) ×1
CANNULA REDUC XI 12-8MM STAPL (CANNULA) ×1
CANNULA REDUCER 12-8 DVNC XI (CANNULA) ×1 IMPLANT
CELLS DAT CNTRL 66122 CELL SVR (MISCELLANEOUS) IMPLANT
CHLORAPREP W/TINT 26ML (MISCELLANEOUS) ×3 IMPLANT
CLIP APPLIE 5 13 M/L LIGAMAX5 (MISCELLANEOUS) IMPLANT
CLIP APPLIE ROT 10 11.4 M/L (STAPLE) IMPLANT
CLIP VESOLOCK LG 6/CT PURPLE (CLIP) IMPLANT
CLIP VESOLOCK MED LG 6/CT (CLIP) IMPLANT
COVER SURGICAL LIGHT HANDLE (MISCELLANEOUS) ×6 IMPLANT
COVER TIP SHEARS 8 DVNC (MISCELLANEOUS) ×1 IMPLANT
COVER TIP SHEARS 8MM DA VINCI (MISCELLANEOUS) ×2
COVER WAND RF STERILE (DRAPES) IMPLANT
DECANTER SPIKE VIAL GLASS SM (MISCELLANEOUS) ×3 IMPLANT
DEVICE TROCAR PUNCTURE CLOSURE (ENDOMECHANICALS) IMPLANT
DRAIN CHANNEL 19F RND (DRAIN) ×3 IMPLANT
DRAPE ARM DVNC X/XI (DISPOSABLE) ×4 IMPLANT
DRAPE COLUMN DVNC XI (DISPOSABLE) ×1 IMPLANT
DRAPE DA VINCI XI ARM (DISPOSABLE) ×8
DRAPE DA VINCI XI COLUMN (DISPOSABLE) ×2
DRAPE SURG IRRIG POUCH 19X23 (DRAPES) ×3 IMPLANT
DRSG OPSITE POSTOP 4X10 (GAUZE/BANDAGES/DRESSINGS) IMPLANT
DRSG OPSITE POSTOP 4X6 (GAUZE/BANDAGES/DRESSINGS) IMPLANT
DRSG OPSITE POSTOP 4X8 (GAUZE/BANDAGES/DRESSINGS) IMPLANT
DRSG TEGADERM 2-3/8X2-3/4 SM (GAUZE/BANDAGES/DRESSINGS) ×15 IMPLANT
DRSG TEGADERM 4X4.75 (GAUZE/BANDAGES/DRESSINGS) ×3 IMPLANT
ELECT PENCIL ROCKER SW 15FT (MISCELLANEOUS) ×3 IMPLANT
ELECT REM PT RETURN 15FT ADLT (MISCELLANEOUS) ×3 IMPLANT
ENDOLOOP SUT PDS II  0 18 (SUTURE)
ENDOLOOP SUT PDS II 0 18 (SUTURE) IMPLANT
EVACUATOR SILICONE 100CC (DRAIN) ×3 IMPLANT
GAUZE SPONGE 2X2 8PLY STRL LF (GAUZE/BANDAGES/DRESSINGS) ×1 IMPLANT
GAUZE SPONGE 4X4 12PLY STRL (GAUZE/BANDAGES/DRESSINGS) IMPLANT
GLOVE ECLIPSE 8.0 STRL XLNG CF (GLOVE) ×15 IMPLANT
GLOVE INDICATOR 8.0 STRL GRN (GLOVE) ×15 IMPLANT
GOWN STRL REUS W/TWL XL LVL3 (GOWN DISPOSABLE) ×15 IMPLANT
GRASPER SUT TROCAR 14GX15 (MISCELLANEOUS) ×3 IMPLANT
HOLDER FOLEY CATH W/STRAP (MISCELLANEOUS) ×3 IMPLANT
IRRIG SUCT STRYKERFLOW 2 WTIP (MISCELLANEOUS)
IRRIGATION SUCT STRKRFLW 2 WTP (MISCELLANEOUS) IMPLANT
KIT PROCEDURE DA VINCI SI (MISCELLANEOUS) ×2
KIT PROCEDURE DVNC SI (MISCELLANEOUS) ×1 IMPLANT
NDL INSUFFLATION 14GA 120MM (NEEDLE) ×1 IMPLANT
NEEDLE INSUFFLATION 14GA 120MM (NEEDLE) ×3 IMPLANT
PACK CARDIOVASCULAR III (CUSTOM PROCEDURE TRAY) ×3 IMPLANT
PACK COLON (CUSTOM PROCEDURE TRAY) ×3 IMPLANT
PAD POSITIONING PINK XL (MISCELLANEOUS) ×3 IMPLANT
PORT LAP GEL ALEXIS MED 5-9CM (MISCELLANEOUS) ×3 IMPLANT
PROTECTOR NERVE ULNAR (MISCELLANEOUS) ×6 IMPLANT
RELOAD STAPLE 45 BLU REG DVNC (STAPLE) IMPLANT
RELOAD STAPLE 45 GRN THCK DVNC (STAPLE) IMPLANT
RETRACTOR WND ALEXIS 18 MED (MISCELLANEOUS) IMPLANT
RTRCTR WOUND ALEXIS 18CM MED (MISCELLANEOUS)
SCISSORS LAP 5X35 DISP (ENDOMECHANICALS) ×3 IMPLANT
SEAL CANN UNIV 5-8 DVNC XI (MISCELLANEOUS) ×4 IMPLANT
SEAL XI 5MM-8MM UNIVERSAL (MISCELLANEOUS) ×8
SEALER VESSEL DA VINCI XI (MISCELLANEOUS) ×2
SEALER VESSEL EXT DVNC XI (MISCELLANEOUS) ×1 IMPLANT
SLEEVE ADV FIXATION 5X100MM (TROCAR) IMPLANT
SOLUTION ELECTROLUBE (MISCELLANEOUS) ×3 IMPLANT
SPONGE GAUZE 2X2 STER 10/PKG (GAUZE/BANDAGES/DRESSINGS) ×2
STAPLER 45 BLU RELOAD XI (STAPLE) ×2 IMPLANT
STAPLER 45 BLUE RELOAD XI (STAPLE) ×4
STAPLER 45 GREEN RELOAD XI (STAPLE)
STAPLER 45 GRN RELOAD XI (STAPLE) IMPLANT
STAPLER CANNULA SEAL DVNC XI (STAPLE) ×1 IMPLANT
STAPLER CANNULA SEAL XI (STAPLE) ×2
STAPLER SHEATH (SHEATH) ×2
STAPLER SHEATH ENDOWRIST DVNC (SHEATH) ×1 IMPLANT
SURGILUBE 2OZ TUBE FLIPTOP (MISCELLANEOUS) ×3 IMPLANT
SUT MNCRL AB 4-0 PS2 18 (SUTURE) ×3 IMPLANT
SUT PDS AB 1 CTX 36 (SUTURE) IMPLANT
SUT PDS AB 1 TP1 96 (SUTURE) IMPLANT
SUT PROLENE 0 CT 2 (SUTURE) IMPLANT
SUT PROLENE 2 0 KS (SUTURE) IMPLANT
SUT PROLENE 2 0 SH DA (SUTURE) IMPLANT
SUT SILK 2 0 (SUTURE)
SUT SILK 2 0 SH CR/8 (SUTURE) IMPLANT
SUT SILK 2-0 18XBRD TIE 12 (SUTURE) IMPLANT
SUT SILK 3 0 (SUTURE) ×3
SUT SILK 3 0 SH 30 (SUTURE) ×6 IMPLANT
SUT SILK 3 0 SH CR/8 (SUTURE) ×3 IMPLANT
SUT SILK 3-0 18XBRD TIE 12 (SUTURE) ×1 IMPLANT
SUT V-LOC BARB 180 2/0GR6 GS22 (SUTURE) ×3
SUT VIC AB 3-0 SH 18 (SUTURE) IMPLANT
SUT VIC AB 3-0 SH 27 (SUTURE)
SUT VIC AB 3-0 SH 27XBRD (SUTURE) IMPLANT
SUT VICRYL 0 UR6 27IN ABS (SUTURE) ×3 IMPLANT
SUTURE V-LC BRB 180 2/0GR6GS22 (SUTURE) IMPLANT
SYR 10ML LL (SYRINGE) ×3 IMPLANT
SYS LAPSCP GELPORT 120MM (MISCELLANEOUS)
SYSTEM LAPSCP GELPORT 120MM (MISCELLANEOUS) IMPLANT
TAPE UMBILICAL COTTON 1/8X30 (MISCELLANEOUS) ×3 IMPLANT
TOWEL OR NON WOVEN STRL DISP B (DISPOSABLE) ×3 IMPLANT
TRAY FOLEY MTR SLVR 16FR STAT (SET/KITS/TRAYS/PACK) ×3 IMPLANT
TROCAR ADV FIXATION 5X100MM (TROCAR) ×3 IMPLANT
TUBING CONNECTING 10 (TUBING) ×4 IMPLANT
TUBING CONNECTING 10' (TUBING) ×2
TUBING INSUFFLATION 10FT LAP (TUBING) ×3 IMPLANT

## 2018-03-25 NOTE — Op Note (Addendum)
03/18/2018  7:29 PM  PATIENT:  Ricardo Villa  84 y.o. male  Patient Care Team: Leighton Ruff, MD as PCP - General (Family Medicine) Michael Boston, MD as Consulting Physician (General Surgery) Armbruster, Carlota Raspberry, MD as Consulting Physician (Gastroenterology) Dohmeier, Asencion Partridge, MD as Consulting Physician (Neurology)  PRE-OPERATIVE DIAGNOSIS:   Ileocolonic anastomotic stricture  Ascending colon polyp unresectable by colonoscopy  POST-OPERATIVE DIAGNOSIS:   Ileocolonic anastomotic stricture  Ascending colon polyp unresectable by colonoscopy Distal Ileal stricture with enteroliths Mid ileal stricture  PROCEDURE:  XI ROBOT ASSISTED ILEOCOLECTOMY ILEAL ENTEROPLASTY ROBOTIC LYSIS OF ADHESIONS X 4 HOURS (2/3 CASE)  SURGEON:  Adin Hector, MD  ASSISTANT: Carlena Hurl, PA-C  ANESTHESIA:   local and general  EBL:  No intake/output data recorded.  Delay start of Pharmacological VTE agent (>24hrs) due to surgical blood loss or risk of bleeding:  no  DRAINS: none   SPECIMEN: Ileocolonic anastomosis including ascending and proximal transverse colon.  Ileum x30 cm with strictures and calcified intraluminal masses  DISPOSITION OF SPECIMEN:  PATHOLOGY  COUNTS:  YES  PLAN OF CARE: Admit to inpatient   PATIENT DISPOSITION:  PACU - hemodynamically stable.  INDICATION:    Patient with history of Crohn's disease with prior resections as well as open cholecystectomy.  No recent events.  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  Also with recurrent polyp just distal to the anastomosis in the ascending colon persistent and recurring despite numerous endoscopic attempts at removal.  I recommended segmental resection:  The anatomy & physiology of the digestive tract was discussed.  The pathophysiology was discussed.  Natural history risks without surgery was discussed.   I worked to give an overview of the disease and the frequent need to  have multispecialty involvement.  I feel the risks of no intervention will lead to serious problems that outweigh the operative risks; therefore, I recommended a partial colectomy to remove the pathology.  Laparoscopic & open techniques were discussed.   Risks such as bleeding, infection, abscess, leak, reoperation, possible ostomy, hernia, heart attack, death, and other risks were discussed.  I noted a good likelihood this will help address the problem.   Goals of post-operative recovery were discussed as well.  We will work to minimize complications.  An educational handout on the pathology was given as well.  Questions were answered.    The patient expresses understanding & wishes to proceed with surgery.  OR FINDINGS:   Patient had extremely dense adhesions of omentum and small bowel to anterior abdominal wall, very dense interloop adhesions with hairpin turns.  The distal foot of ileum had tight inflamed corkscrew adhesions and stricturing with intraluminal calcified mass that felt like gallstones -consistent with chronic enterolith/fecaliths..  Consistent with chronic obstruction in that region.  Numerous interloop adhesions of small bowel with mid ileal transition point consistent with stricture.  Opened up and closed transversely for an enteroplasty  Tattoo at colon just distal at ileocolonic anastomosis resting in the right paracolic gutter.  Ileocolonic anastomosis (mid ileum to mid transverse colon) rests in the central abdomen.  It is an isoperistaltic anastomosis  210 cm small intestine remains.  No obvious metastatic disease on visceral parietal peritoneum or liver.   DESCRIPTION:   Informed consent was confirmed.  The patient underwent general anaesthesia without difficulty.  The patient was positioned with arms tucked & secured appropriately.  VTE prevention in place.  The patient's abdomen was clipped, prepped, & draped in  a sterile fashion.  Surgical timeout confirmed our  plan.  The patient was positioned in reverse Trendelenburg.  Abdominal entry was gained using Varess technique at the left subcostal ridge on the anterior abdominal wall.  No elevated EtCO2 noted.  Port placed.  Camera inspection revealed no injury.  Extra ports were carefully placed under direct laparoscopic visualization.  We docked the Inituitive Vinci robot carefully and placed intstruments under visualization  Patient had very dense adhesions of small bowel and omentum to the anterior abdominal wall.  These were carefully freed off sharply.  Eventually freed that off.  Then freed the greater omentum off its attachments to the small intestine starting from the appendix flexure to the splenic flexure.  This help me identify the transverse colon.  I could get to the hepatic flexure and then I cannot see the ileocolonic anastomosis.  I proceeded to do lyse adhesions to free off numerous interloop adhesions of small intestine.  I focused on the right side of the abdomen at first.  Many hairpin turns almost like a brain hold of cotton candy and chewing gum.  Gradually freed and untwisted the small intestine.  I encountered pockets of adhesions to the retroperitoneum with internal hernias.  One area of a questionable transition point.  The other not so much.  Eventually came more distally.  But again encountered dense retroperitoneal adhesions on the right pericolic gutter.  I focused on freeing the small intestine off the sigmoid colon and right pelvis.  Mobilized the small intestine mesentery off the right paracolic gutter.  I transected the greater omentum off the mid transverse colon and went more proximally to free adhesions off the liver from prior open cholecystectomy.  I was able to elevate the right colon mesentery.  I scored the mesentery underneath what seemed a good candidate for the ileocolic pedicle.  I was able to elevate the proximal colon to isolate the ileocolonic pedicle.  I scored the ileal  mesentery just proximal to that.   I carried that further dissection in a medial to lateral fashion.  I was able to bluntly get into the retro-mesenteric plane on the right side.  I freed the proximal right sided colonic mesentery off the retroperitoneum including the duodenal sweep, pancreatic head, & Gerota's fascia of the right kidney. I was able to get underneath the hepatic flexure.  I was able to get underneath the proximal and mid transverse colon.   I transected the transverse colon mesentery just proximal to the middle colic pedicle radially.  And then transected between that and the ileocolic pedicle as well.  I mobilized the transverse colon in a superior to inferior fashion off the liver and retroperitoneum and hepatic flexure.  I connected with some lateral dissection done of the distal ileal and ascending colon mesenteric attachments to the lateral retroperitoneum along the right paracolic gutter.  I then refocused on the small intestine try to skeletonize.  However the last foot of ileum was densely adherent with inflamed adhesions.  It was like concrete.  No good window.  Seem like an obvious transition point with an ileal stricture.  I did not think it could be spared.  Therefore decided to transect this distal foot of ileum.  I chose an area of mid ileum that was healthy and noninflamed.  I transected the mesentery radially, coming towards the ileocolonic pedicle.  Elevated and transected off carefully and eventually was able to do complete mesenteric resection of that distal ileum to transverse colon.  This allowed complete transection.  We then he went ahead and proceeded with transection. I transected the mid ileum with a robotic stapler.   Transected at the proximal transverse colon with a robotic stapler.  We assured hemostasis.  I ran the small intestine and proximally to the ligament of Treitz.  I freed off interloop adhesions.  Proximal jejunum not involved.  There was a tight  corkscrewed area in the mid ileum that I freed off.  There was a stricturing there.  Ended up having to open up the bowel to transected off and untwisted.  I closed the stricture area transversely for a full-thickness stricturoplasty/enteroplasty.  That allowed the narrowing to be much more open.  I again ran the small bowel completely and saw no serosal or other abnormalities.  Small intestine much more normal.  Hemostasis good.  I did a side-to-side stapled anastomosis of ileum to mid-transverse colon using a 85m robotic stapler x 2 firings in an isoperistaltic fashion.  (Distal stump of ileum to mid transverse colon for the distal end of the anastomosis.  Proximal end of colon stump to more proximal ileum for the proximal end of the anastomosis).  I sewed the common staple channel wound with an absorbable suture ( 2-0 V-lock) in a running [place] from each corner and meeting in the center.  I did meticulous inspection prove an airtight closure.  I protected the anastomosis line with an anterior omentopexy of greater omentum using V lock suture.    We did reinspection of the abdomen.  Ran the small bowel again.  Hemostasis was good.   Ureters, retroperitoneum, and bowel uninjured.  The anastomosis looked healthy.  We did a final irrigation of antibiotic solution (900 mg clindamycin/240 mg gentamicin in a liter of crystalloid) & held that while we placed the wound protector through the suprapubic midline 163mport site after it was enlarged in a Pfannenstiel fashion.  Specimen removed without incident.  We aspirated the antibiotic irrigation.  Hemostasis was good.  Sterile unused instruments were used from this point.  I closed the skin at the port sites using Monocryl stitch and sterile dressing.  I closed the extraction wound using a 0 Vicryl vertical peritoneal closure and a #1 PDS transverse anterior rectal fascial closure like a small Pfannenstiel closure. I closed the skin with some interrupted  Monocryl stitches. I placed antibiotic-soaked wicks into the closure at the corners x2.  I placed sterile dressings.     Patient is being extubated go to recovery room. I discussed postop care with the patient in detail the office & in the holding area. Instructions are written.  I updated the patient's status to the family.  Recommendations were made.  Questions were answered.  The family expressed understanding & appreciation.  StAdin HectorM.D., F.A.C.S. [facility]astrointestinal [facility]ally [facility] [address] [phone] / Paging

## 2018-03-25 NOTE — H&P (Signed)
Ricardo Villa Documented: 01/13/2018 8:48 AM Location: [facility] Patient #: [id number] DOB: 01/30/1935 Married / Language: Ricardo Villa / Race: White Male   Patient Care Team: Leighton Ruff, MD as PCP - General (Family Medicine) Michael Boston, MD as Consulting Physician (General Surgery) Armbruster, Carlota Raspberry, MD as Consulting Physician (Gastroenterology) Dohmeier, Asencion Partridge, MD as Consulting Physician (Neurology) Marland Kitchen ` Patient sent for surgical consultation at the request of Dr Poole Cellar  Chief Complaint: Recurrent ileal colonic stricture. Proximal colonic polyp. Recurrent hepatic flexure polyp. History of Crohn's disease. ` ` The patient is a pleasant elderly gentleman with history of Crohn's disease status post prior ileal resection. Initial one in 1988. Then another resection in 1995. His had intermittent Crohn's flare usually managed with intermittent prednisone. Was on 6 mercaptopurine. Tapered off in 2017. Also history of polyps. Had a large proximal colonic polyp that underwent endoscopic mucosal resection in the past.   Endoscopic history: Colonoscopy 12/2012 - large tubulovilloous adenoma and another adenoma, one in ascending and one in sigmoid, not entirely removed and attempted to be removed in piecemeal per Dr. Olevia Perches Colonoscopy 05/31/15 - 4 cm right sided polyp (not attempted to be removed at [facility]), nodularity at anastomosis - bx show TVA Colonoscopy 07/24/15 - EMR of large right sided polyp, fair prep Colonoscopy 12/12/15 - stenosis of surgical anastomosis but no inflammatory changes, 1cm anastomotic polyp removed path c/w adenoma, small area of residual polyp at EMR site removed with snare and treated with APC, path c/w adenoma    History of bowel obstruction with ileal stricture last year that resolved in 2014, 2016, and 2018. Resolved without surgery but needed NGT & admissions/IVF. Had follow-up colonoscopy for his polyps. Found to have  significant ileal stricture. Found to have large polyp in the ascending colon. Site of prior polypectomy with endoscopic mucosal resection possible recurrence. Tattooed. Request made for more definitive surgical intervention Had full colonoscopy.  the patient comes in with his wife and daughter. Rather against surgery at first. He walks 30 minutes a day. No problems with eating. Moves his bowels every day. Not on any bloodichinners. just a low-dose aspirin. He does not smoke. He is not diabetic. he had an open cholecystectomy many years ago. He has had two ileal and ileocolonic resections total.  (Review of systems as stated in this history (HPI) or in the review of systems. Otherwise all other 12 point ROS are negative) ` ` `   Past Surgical History (Tanisha A. Owens Shark, [place]; 01/13/2018 8:48 AM) Colon Polyp Removal - Colonoscopy  Gallbladder Surgery - Open  Tonsillectomy  Vasectomy   Allergies (Tanisha A. Owens Shark, [place]; 01/13/2018 8:49 AM) No Known Drug Allergies [01/13/2018]: Allergies Reconciled   Medication History (Tanisha A. Owens Shark, [place]; 01/13/2018 8:51 AM) Aspirin (81MG Tablet, Oral) Active. Calcium (500MG Tablet, Oral) Active. Lomotil (2.5-0.025MG Tablet, Oral) Active. Fenofibrate (54MG Tablet, Oral) Active. Mucinex (600MG Tablet ER 12HR, Oral) Active. [facility] Vitamin B12 (1000MCG Tablet, Oral) Active. Medications Reconciled  Social History (Tanisha A. Owens Shark, [place]; 01/13/2018 8:48 AM) Alcohol use  Occasional alcohol use. Caffeine use  Coffee. No drug use  Tobacco use  Former smoker.  Family History (Tanisha A. Owens Shark, [place]; 01/13/2018 8:48 AM) Alcohol Abuse  Father, Mother.    Review of Systems (Tanisha A. Brown RMA; 01/13/2018 8:48 AM) General Not Present- Appetite Loss, Chills, Fatigue, Fever, Night Sweats, Weight Gain and Weight Loss. Skin Present- Dryness. Not Present- Change in Wart/Mole, Hives, Jaundice, New Lesions, Non-Healing Wounds, Rash and  Ulcer.  HEENT Not Present- Earache, Hearing Loss, Hoarseness, Nose Bleed, Oral Ulcers, Ringing in the Ears, Seasonal Allergies, Sinus Pain, Sore Throat, Visual Disturbances, Wears glasses/contact lenses and Yellow Eyes. Respiratory Not Present- Bloody sputum, Chronic Cough, Difficulty Breathing, Snoring and Wheezing. Breast Not Present- Breast Mass, Breast Pain, Nipple Discharge and Skin Changes. Cardiovascular Not Present- Chest Pain, Difficulty Breathing Lying Down, Leg Cramps, Palpitations, Rapid Heart Rate, Shortness of Breath and Swelling of Extremities. Gastrointestinal Not Present- Abdominal Pain, Bloating, Bloody Stool, Change in Bowel Habits, Chronic diarrhea, Constipation, Difficulty Swallowing, Excessive gas, Gets full quickly at meals, Hemorrhoids, Indigestion, Nausea, Rectal Pain and Vomiting. Musculoskeletal Not Present- Back Pain, Joint Pain, Joint Stiffness, Muscle Pain, Muscle Weakness and Swelling of Extremities. Neurological Not Present- Decreased Memory, Fainting, Headaches, Numbness, Seizures, Tingling, Tremor, Trouble walking and Weakness. Psychiatric Not Present- Anxiety, Bipolar, Change in Sleep Pattern, Depression, Fearful and Frequent crying. Endocrine Not Present- Cold Intolerance, Excessive Hunger, Hair Changes, Heat Intolerance, Hot flashes and New Diabetes. Hematology Not Present- Blood Thinners, Easy Bruising, Excessive bleeding, Gland problems, HIV and Persistent Infections.  Vitals (Tanisha A. Brown RMA; 01/13/2018 8:49 AM) 01/13/2018 8:49 AM Weight: 165 lb Height: 71in Body Surface Area: 1.94 m Body Mass Index: 23.01 kg/m  Temp.: 97.107F  Pulse: 95 (Regular)   BP 117/60   Pulse 86   Temp 97.7 F (36.5 C) (Oral)   Resp 16   Ht 6' (1.829 m)   Wt 75.8 kg   SpO2 100%   BMI 22.65 kg/m       Physical Exam Adin Hector MD; 01/13/2018 1:38 PM) General Mental Status-Alert. General Appearance-Not in acute distress, Not  Sickly. Orientation-Oriented X3. Hydration-Well hydrated. Voice-Normal. Note: Alert. Inquisitive. Moves around easily/normally   Integumentary Global Assessment Upon inspection and palpation of skin surfaces of the - Axillae: non-tender, no inflammation or ulceration, no drainage. and Distribution of scalp and body hair is normal. General Characteristics Temperature - normal warmth is noted.  Head and Neck Head-normocephalic, atraumatic with no lesions or palpable masses. Face Global Assessment - atraumatic, no absence of expression. Neck Global Assessment - no abnormal movements, no bruit auscultated on the right, no bruit auscultated on the left, no decreased range of motion, non-tender. Trachea-midline. Thyroid Gland Characteristics - non-tender.  Eye Eyeball - Left-Extraocular movements intact, No Nystagmus. Eyeball - Right-Extraocular movements intact, No Nystagmus. Cornea - Left-No Hazy. Cornea - Right-No Hazy. Sclera/Conjunctiva - Left-No scleral icterus, No Discharge. Sclera/Conjunctiva - Right-No scleral icterus, No Discharge. Pupil - Left-Direct reaction to light normal. Pupil - Right-Direct reaction to light normal. Note: Wears glasses. Vision corrected   ENMT Ears Pinna - Left - no drainage observed, no generalized tenderness observed. Right - no drainage observed, no generalized tenderness observed. Nose and Sinuses External Inspection of the Nose - no destructive lesion observed. Inspection of the nares - Left - quiet respiration. Right - quiet respiration. Mouth and Throat Lips - Upper Lip - no fissures observed, no pallor noted. Lower Lip - no fissures observed, no pallor noted. Nasopharynx - no discharge present. Oral Cavity/Oropharynx - Tongue - no dryness observed. Oral Mucosa - no cyanosis observed. Hypopharynx - no evidence of airway distress observed.  Chest and Lung Exam Inspection Movements - Normal and Symmetrical.  Accessory muscles - No use of accessory muscles in breathing. Palpation Palpation of the chest reveals - Non-tender. Auscultation Breath sounds - Normal and Clear.  Cardiovascular Auscultation Rhythm - Regular. Murmurs & Other Heart Sounds - Auscultation of the heart reveals - No Murmurs  and No Systolic Clicks.  Abdomen Inspection Inspection of the abdomen reveals - No Visible peristalsis and No Abnormal pulsations. Umbilicus - No Bleeding, No Urine drainage. Palpation/Percussion Palpation and Percussion of the abdomen reveal - Soft, Non Tender, No Rebound tenderness, No Rigidity (guarding) and No Cutaneous hyperesthesia. Note: right subcostal and long midline incisions consistent with prior laparotomies and cholecystectomy. Abdomen soft. Nontender. Not distended. No umbilical or incisional hernias. No guarding.   Male Genitourinary Sexual Maturity Tanner 5 - Adult hair pattern and Adult penile size and shape.  Peripheral Vascular Upper Extremity Inspection - Left - No Cyanotic nailbeds, Not Ischemic. Right - No Cyanotic nailbeds, Not Ischemic.  Neurologic Neurologic evaluation reveals -normal attention span and ability to concentrate, able to name objects and repeat phrases. Appropriate fund of knowledge , normal sensation and normal coordination. Mental Status Affect - not angry, not paranoid. Cranial Nerves-Normal Bilaterally. Gait-Normal.  Neuropsychiatric Mental status exam performed with findings of-able to articulate well with normal speech/language, rate, volume and coherence, thought content normal with ability to perform basic computations and apply abstract reasoning and no evidence of hallucinations, delusions, obsessions or homicidal/suicidal ideation.  Musculoskeletal Global Assessment Spine, Ribs and Pelvis - no instability, subluxation or laxity. Right Upper Extremity - no instability, subluxation or laxity.  Lymphatic Head & Neck  General Head  & Neck Lymphatics: Bilateral - Description - No Localized lymphadenopathy. Axillary  General Axillary Region: Bilateral - Description - No Localized lymphadenopathy. Femoral & Inguinal  Generalized Femoral & Inguinal Lymphatics: Left - Description - No Localized lymphadenopathy. Right - Description - No Localized lymphadenopathy.    Assessment & Plan  ADENOMATOUS POLYP OF ASCENDING COLON (D12.2) Impression: Region and proximal colon with prior scarring from resection of recurrent polyp. Possible recurrence there. Flat sessile polyp not amenable to endoscopic resection given prior endoscopic resection and ileocolonic stricturing nearby  Standard of care would be segmental colonic resection to remove the unresectable polyp of concern. While he is 85, he is in good physical shape. I recommended being preemptive and resecting the area of concern before it turns into a cancer causes future problems.  My other concern is that he's had 3 admissions in the past 5 years for bowel obstructions with the transition point being near the prior ileocolonic resections with stricturing. Perhaps on the colon side. This operation will remove that strictured area as well.  He confesses he came in dead set against any surgery but will think about it, knowing that this area to get him to future problems. My concern is that this has a decent likely of causing obstruction or other issues, becoming an emergency.. They will want emergency surgery. The risk will be much higher at that time.  Given his history of TIA and advanced age, I'm pretty sure anesthesia wanted cardiac clearance.  He saw Dr. Irish Lack who cleared the patient for cardiac clearance. We will have him be evaluated them and sent get a sense of his operative risks. Patient can walk several miles without difficulty and seems to have good exercise tolerance, so hopefully not too likely. Patient's wife sees Dr. Fransico Him regularly. They are happy with that  group evaluating him. He will least do that and think about things and let us know.  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 No active Crohn's disease as best I can gather. I would resect the area  that is caused partial obstruction 3 times in the past 5 years.  CROHN'S DISEASE OF LARGE INTESTINE WITH COMPLICATION (R44.315) Impression: History of Crohn's disease on intermittent intermittent immunosuppression. Nothing for the past 2 years. No strong evidence of active Crohn's disease at this time. He may need to be back on it in the future, but I don't get a sense gastroenterology recommend anything at this time. His risks of anastomotic leak and need for ostomy will be lower if he is not fully immunosuppressed anyway. We'll defer to gastroenterology on long-term recommendations  The anatomy & physiology of the digestive tract was discussed. The pathophysiology of the colon was discussed. Natural history risks without surgery was discussed. I feel the risks of no intervention will lead to serious problems that outweigh the operative risks; therefore, I recommended a partial colectomy to remove the pathology. Minimally invasive (Robotic/Laparoscopic) & open techniques were discussed.  Risks such as bleeding, infection, abscess, leak, reoperation, possible ostomy, hernia, heart attack, death, and other risks were discussed. I noted a good likelihood this will help address the problem. Goals of post-operative recovery were discussed as well. Need for adequate nutrition, daily bowel regimen and healthy physical activity, to optimize recovery was noted as well. We will work to minimize complications. Educational materials were available as well. Questions were answered. The patient expresses understanding & wishes to proceed with  surgery.    Adin Hector, MD, FACS, MASCRS Gastrointestinal and Minimally Invasive Surgery    [address], [place], [place] [address] [phone] [address] / Paging [phone] Fax

## 2018-03-25 NOTE — Transfer of Care (Signed)
Immediate Anesthesia Transfer of Care Note  Patient: Ricardo Villa  Procedure(s) Performed: XI ROBOT ASSISTED PROXIMAL COLECTOMY WITH LYSIS OF ADHESIONS (N/A Abdomen)  Patient Location: PACU  Anesthesia Type:General  Level of Consciousness: awake and patient cooperative  Airway & Oxygen Therapy: Patient Spontanous Breathing and Patient connected to face mask oxygen  Post-op Assessment: Report given to RN, Post -op Vital signs reviewed and stable and Patient moving all extremities X 4  Post vital signs: stable  Last Vitals:  Vitals Value Taken Time  BP 121/65 03/12/2018  7:45 PM  Temp    Pulse 87 03/26/2018  7:56 PM  Resp 14 03/24/2018  7:56 PM  SpO2 98 % 03/31/2018  7:56 PM  Vitals shown include unvalidated device data.  Last Pain:  Vitals:   03/20/2018 1942  TempSrc:   PainSc: (P) Asleep         Complications: No apparent anesthesia complications

## 2018-03-25 NOTE — Anesthesia Procedure Notes (Signed)
Procedure Name: Intubation Date/Time: 04/09/2018 1:41 PM Performed by: Mitzie Na, CRNA Pre-anesthesia Checklist: Patient identified, Emergency Drugs available, Suction available, Patient being monitored and Timeout performed Patient Re-evaluated:Patient Re-evaluated prior to induction Oxygen Delivery Method: Circle system utilized Preoxygenation: Pre-oxygenation with 100% oxygen Induction Type: IV induction Ventilation: Mask ventilation without difficulty and Oral airway inserted - appropriate to patient size Laryngoscope Size: Mac and 4 Grade View: Grade II Tube type: Oral Tube size: 7.5 mm Number of attempts: 1 Airway Equipment and Method: Stylet Placement Confirmation: ETT inserted through vocal cords under direct vision,  positive ETCO2 and breath sounds checked- equal and bilateral Secured at: 25 cm Tube secured with: Tape Dental Injury: Teeth and Oropharynx as per pre-operative assessment

## 2018-03-25 NOTE — Anesthesia Preprocedure Evaluation (Signed)
Anesthesia Evaluation  Patient identified by MRN, date of birth, ID band Patient awake    Reviewed: Allergy & Precautions, NPO status , Patient's Chart, lab work & pertinent test results  History of Anesthesia Complications (+) PONV and history of anesthetic complications  Airway Mallampati: III       Dental  (+) Teeth Intact, Dental Advisory Given   Pulmonary neg shortness of breath, neg sleep apnea, neg COPD, neg recent URI, former smoker,    Pulmonary exam normal breath sounds clear to auscultation       Cardiovascular Exercise Tolerance: Good (-) hypertension(-) angina(-) Past MI, (-) Cardiac Stents, (-) CABG and (-) Orthopnea negative cardio ROS  (-) dysrhythmias  Rhythm:Regular Rate:Normal  HLD   Neuro/Psych neg Seizures TIA (about 12 years ago)   GI/Hepatic Neg liver ROS, Bowel prep,GERD  Controlled,Crohn's disease, diverticulosis   Endo/Other  negative endocrine ROS  Renal/GU negative Renal ROS     Musculoskeletal   Abdominal   Peds  Hematology negative hematology ROS (+)   Anesthesia Other Findings Prostate cancer, glaucoma, vitamin B12 deficiency, hyperhomocysteinemia  Reproductive/Obstetrics                             Anesthesia Physical  Anesthesia Plan  ASA: II  Anesthesia Plan: General   Post-op Pain Management:    Induction: Intravenous  PONV Risk Score and Plan: 3 and Ondansetron and Treatment may vary due to age or medical condition  Airway Management Planned: Oral ETT  Additional Equipment:   Intra-op Plan:   Post-operative Plan:   Informed Consent: I have reviewed the patients History and Physical, chart, labs and discussed the procedure including the risks, benefits and alternatives for the proposed anesthesia with the patient or authorized representative who has indicated his/her understanding and acceptance.     Dental advisory given  Plan  Discussed with: CRNA  Anesthesia Plan Comments:         Anesthesia Quick Evaluation

## 2018-03-26 ENCOUNTER — Other Ambulatory Visit: Payer: Self-pay

## 2018-03-26 LAB — BASIC METABOLIC PANEL
ANION GAP: 9 (ref 5–15)
BUN: 10 mg/dL (ref 8–23)
CALCIUM: 7.9 mg/dL — AB (ref 8.9–10.3)
CO2: 25 mmol/L (ref 22–32)
Chloride: 110 mmol/L (ref 98–111)
Creatinine, Ser: 1.67 mg/dL — ABNORMAL HIGH (ref 0.61–1.24)
GFR calc Af Amer: 44 mL/min — ABNORMAL LOW (ref >60)
GFR calc non Af Amer: 38 mL/min — ABNORMAL LOW (ref >60)
Glucose, Bld: 125 mg/dL — ABNORMAL HIGH (ref 70–99)
Potassium: 3.8 mmol/L (ref 3.5–5.1)
Sodium: 144 mmol/L (ref 135–145)

## 2018-03-26 LAB — CBC
HCT: 34.6 % — ABNORMAL LOW (ref 39.0–52.0)
Hemoglobin: 10.9 g/dL — ABNORMAL LOW (ref 13.0–17.0)
MCH: 29 pg (ref 26.0–34.0)
MCHC: 31.5 g/dL (ref 30.0–36.0)
MCV: 92 fL (ref 80.0–100.0)
PLATELETS: 226 10*3/uL (ref 150–400)
RBC: 3.76 MIL/uL — ABNORMAL LOW (ref 4.22–5.81)
RDW: 13.2 % (ref 11.5–15.5)
WBC: 13.3 10*3/uL — ABNORMAL HIGH (ref 4.0–10.5)
nRBC: 0 % (ref 0.0–0.2)

## 2018-03-26 LAB — MAGNESIUM: MAGNESIUM: 1.6 mg/dL — AB (ref 1.7–2.4)

## 2018-03-26 LAB — ABO/RH: ABO/RH(D): A POS

## 2018-03-26 MED ORDER — GABAPENTIN 300 MG PO CAPS
300.0000 mg | ORAL_CAPSULE | Freq: Two times a day (BID) | ORAL | Status: DC
  Administered 2018-03-26 (×3): 300 mg via ORAL
  Filled 2018-03-26 (×3): qty 1

## 2018-03-26 MED ORDER — PROCHLORPERAZINE EDISYLATE 10 MG/2ML IJ SOLN: 5.0000 mg | Freq: Four times a day (QID) | INTRAMUSCULAR | Status: DC | PRN

## 2018-03-26 MED ORDER — ASPIRIN EC 81 MG PO TBEC
81.0000 mg | DELAYED_RELEASE_TABLET | Freq: Every day | ORAL | Status: DC
  Administered 2018-03-26: 81 mg via ORAL
  Filled 2018-03-26: qty 1

## 2018-03-26 MED ORDER — ENSURE SURGERY PO LIQD
237.0000 mL | Freq: Two times a day (BID) | ORAL | Status: DC
  Administered 2018-03-26: 237 mL via ORAL
  Filled 2018-03-26 (×4): qty 237

## 2018-03-26 MED ORDER — VITAMIN B-12 1000 MCG PO TABS: 1000.0000 ug | ORAL_TABLET | ORAL | Status: DC

## 2018-03-26 MED ORDER — METOPROLOL TARTRATE 5 MG/5ML IV SOLN: 5.0000 mg | Freq: Four times a day (QID) | INTRAVENOUS | Status: DC | PRN

## 2018-03-26 MED ORDER — ENOXAPARIN SODIUM 40 MG/0.4ML ~~LOC~~ SOLN
40.0000 mg | SUBCUTANEOUS | Status: DC
  Administered 2018-03-26: 40 mg via SUBCUTANEOUS
  Filled 2018-03-26: qty 0.4

## 2018-03-26 MED ORDER — LACTATED RINGERS IV BOLUS
1000.0000 mL | Freq: Once | INTRAVENOUS | Status: AC
  Administered 2018-03-26: 1000 mL via INTRAVENOUS

## 2018-03-26 MED ORDER — TRAMADOL HCL 50 MG PO TABS
50.0000 mg | ORAL_TABLET | Freq: Four times a day (QID) | ORAL | Status: DC | PRN
  Administered 2018-03-26: 100 mg via ORAL
  Filled 2018-03-26: qty 2

## 2018-03-26 MED ORDER — SODIUM CHLORIDE 0.9 % IV SOLN
2.0000 g | Freq: Two times a day (BID) | INTRAVENOUS | Status: AC
  Administered 2018-03-26: 2 g via INTRAVENOUS
  Filled 2018-03-26: qty 2

## 2018-03-26 MED ORDER — HYDROMORPHONE HCL 1 MG/ML IJ SOLN
0.5000 mg | INTRAMUSCULAR | Status: DC | PRN
  Administered 2018-03-26 (×2): 2 mg via INTRAVENOUS
  Filled 2018-03-26 (×2): qty 2

## 2018-03-26 MED ORDER — ONDANSETRON HCL 4 MG/2ML IJ SOLN
4.0000 mg | Freq: Four times a day (QID) | INTRAMUSCULAR | Status: DC | PRN
  Administered 2018-03-27: 4 mg via INTRAVENOUS
  Filled 2018-03-26: qty 2

## 2018-03-26 MED ORDER — HYDRALAZINE HCL 20 MG/ML IJ SOLN: 10.0000 mg | INTRAMUSCULAR | Status: DC | PRN

## 2018-03-26 MED ORDER — ALUM & MAG HYDROXIDE-SIMETH 200-200-20 MG/5ML PO SUSP: 30.0000 mL | Freq: Four times a day (QID) | ORAL | Status: DC | PRN

## 2018-03-26 MED ORDER — METOCLOPRAMIDE HCL 5 MG/ML IJ SOLN: 10.0000 mg | Freq: Four times a day (QID) | INTRAMUSCULAR | Status: DC | PRN

## 2018-03-26 MED ORDER — SACCHAROMYCES BOULARDII 250 MG PO CAPS
250.0000 mg | ORAL_CAPSULE | Freq: Two times a day (BID) | ORAL | Status: DC
  Administered 2018-03-26 (×3): 250 mg via ORAL
  Filled 2018-03-26 (×3): qty 1

## 2018-03-26 MED ORDER — SODIUM CHLORIDE 0.9 % IV SOLN
25.0000 mg | Freq: Once | INTRAVENOUS | Status: AC
  Administered 2018-03-26: 25 mg via INTRAVENOUS
  Filled 2018-03-26: qty 0.5

## 2018-03-26 MED ORDER — ALVIMOPAN 12 MG PO CAPS
12.0000 mg | ORAL_CAPSULE | Freq: Two times a day (BID) | ORAL | Status: DC
  Administered 2018-03-26 (×2): 12 mg via ORAL
  Filled 2018-03-26 (×2): qty 1

## 2018-03-26 MED ORDER — CALCIUM CARBONATE ANTACID 500 MG PO CHEW
1.0000 | CHEWABLE_TABLET | Freq: Every day | ORAL | Status: DC | PRN
  Administered 2018-03-26: 200 mg via ORAL
  Filled 2018-03-26: qty 1

## 2018-03-26 MED ORDER — LACTATED RINGERS IV SOLN: INTRAVENOUS | Status: DC

## 2018-03-26 MED ORDER — PROCHLORPERAZINE MALEATE 10 MG PO TABS: 10.0000 mg | ORAL_TABLET | Freq: Four times a day (QID) | ORAL | Status: DC | PRN

## 2018-03-26 MED ORDER — SODIUM CHLORIDE 0.9 % IV SOLN
1000.0000 mL | Freq: Three times a day (TID) | INTRAVENOUS | Status: DC | PRN
  Administered 2018-03-26: 1000 mL via INTRAVENOUS

## 2018-03-26 MED ORDER — GUAIFENESIN ER 600 MG PO TB12
600.0000 mg | ORAL_TABLET | Freq: Two times a day (BID) | ORAL | Status: DC | PRN
  Administered 2018-03-26: 600 mg via ORAL
  Filled 2018-03-26: qty 1

## 2018-03-26 MED ORDER — SODIUM CHLORIDE 0.9 % IV SOLN
500.0000 mg | Freq: Once | INTRAVENOUS | Status: AC
  Administered 2018-03-27: 500 mg via INTRAVENOUS
  Filled 2018-03-26: qty 10

## 2018-03-26 MED ORDER — ONDANSETRON HCL 4 MG PO TABS: 4.0000 mg | ORAL_TABLET | Freq: Four times a day (QID) | ORAL | Status: DC | PRN

## 2018-03-26 MED ORDER — ALBUMIN HUMAN 5 % IV SOLN
12.5000 g | Freq: Four times a day (QID) | INTRAVENOUS | Status: DC | PRN
  Filled 2018-03-26 (×2): qty 250

## 2018-03-26 MED ORDER — DIPHENHYDRAMINE HCL 12.5 MG/5ML PO ELIX: 12.5000 mg | ORAL_SOLUTION | Freq: Four times a day (QID) | ORAL | Status: DC | PRN

## 2018-03-26 MED ORDER — LATANOPROST 0.005 % OP SOLN
1.0000 [drp] | Freq: Every day | OPHTHALMIC | Status: DC
  Administered 2018-03-26 – 2018-03-28 (×3): 1 [drp] via OPHTHALMIC
  Filled 2018-03-26 (×2): qty 2.5

## 2018-03-26 MED ORDER — DIPHENHYDRAMINE HCL 50 MG/ML IJ SOLN: 12.5000 mg | Freq: Four times a day (QID) | INTRAMUSCULAR | Status: DC | PRN

## 2018-03-26 MED ORDER — FENOFIBRATE 54 MG PO TABS
54.0000 mg | ORAL_TABLET | Freq: Every day | ORAL | Status: DC
  Administered 2018-03-26: 54 mg via ORAL
  Filled 2018-03-26: qty 1

## 2018-03-26 MED ORDER — ACETAMINOPHEN 500 MG PO TABS
1000.0000 mg | ORAL_TABLET | Freq: Four times a day (QID) | ORAL | Status: DC
  Administered 2018-03-26 – 2018-03-27 (×5): 1000 mg via ORAL
  Filled 2018-03-26 (×5): qty 2

## 2018-03-26 MED ORDER — MAGNESIUM SULFATE 2 GM/50ML IV SOLN
2.0000 g | Freq: Once | INTRAVENOUS | Status: AC
  Administered 2018-03-26: 2 g via INTRAVENOUS
  Filled 2018-03-26: qty 50

## 2018-03-26 NOTE — Progress Notes (Signed)
Ricardo Villa [id number] December 22, 1934  CARE TEAM:  PCP: Leighton Ruff, MD  Outpatient Care Team: Patient Care Team: Leighton Ruff, MD as PCP - General (Family Medicine) Michael Boston, MD as Consulting Physician (General Surgery) Armbruster, Carlota Raspberry, MD as Consulting Physician (Gastroenterology) Dohmeier, Asencion Partridge, MD as Consulting Physician (Neurology)  Inpatient Treatment Team: Treatment Team: Attending Provider: Michael Boston, MD; Registered Nurse: Kerney Elbe, RN   Problem List:   Principal Problem:   Crohn's disease of both small and large intestine with intestinal obstruction [facility]) Active Problems:   Vitamin B12 deficiency   Crohn's disease ([place])   IRON DEFICIENCY ANEMIA, HX OF   History of colonic polyps   History of TIA (transient ischemic attack)   Colon polyp   CKD (chronic kidney disease), stage II   Stricture of intestine or colon ([place])   1 Day Post-Op  03/12/2018  POST-OPERATIVE DIAGNOSIS:   Ileocolonic anastomotic stricture  Ascending colon polyp unresectable by colonoscopy Distal Ileal stricture with enteroliths Mid ileal stricture  PROCEDURE:  XI ROBOT ASSISTED ILEOCOLECTOMY ILEAL ENTEROPLASTY ROBOTIC LYSIS OF ADHESIONS X 4 HOURS (2/3 CASE)  SURGEON:  Adin Hector, MD   Assessment  Recovering  [facility] Stay = 1 days)  Plan:  -Dys1/full liquid diet -f/u pathology -stop IVF.  PRN boluses -follow I&O closely w CKD  -BP control -vitB12 replacement -VTE prophylaxis- SCDs, etc -mobilize as tolerated to help recovery  20 minutes spent in review, evaluation, examination, counseling, and coordination of care.  More than 50% of that time was spent in counseling.  I updated the patient's status to the patient and nurse.  Recommendations were made.  Questions were answered.  They expressed understanding & appreciation.   03/26/2018    Subjective: (Chief complaint)  Up in chair.  Somehow has NG tube.  Pain minimal  and well controlled.  Nursing just outside room.  Objective:  Vital signs:  Vitals:   03/26/18 0021 03/26/18 0155 03/26/18 0521 03/26/18 0626  BP: 121/62 131/65 122/68   Pulse: 86 89 87   Resp: 18 16 18    Temp: 98.3 F (36.8 C) 98 F (36.7 C) 98.4 F (36.9 C)   TempSrc: Oral Oral Oral   SpO2: 99% 100% 100% 97%  Weight:      Height:        Last BM Date: 03/24/18  Intake/Output   Yesterday:  01/15 0701 - 01/16 0700 In: 3687.7 [I.V.:3287.7; IV Piggyback:250] Out: 1000 [Urine:850; Emesis/NG output:50; Blood:100] This shift:  No intake/output data recorded.  Bowel function:  Flatus: No  BM:  No  Drain: (No drain)   Physical Exam:  General: Pt awake/alert/oriented x4 in no acute distress Eyes: PERRL, normal EOM.  Sclera clear.  No icterus Neuro: CN II-XII intact w/o focal sensory/motor deficits. Lymph: No head/neck/groin lymphadenopathy Psych:  No delerium/psychosis/paranoia HENT: Normocephalic, Mucus membranes moist.  No thrush Neck: Supple, No tracheal deviation Chest: No chest wall pain w good excursion CV:  Pulses intact.  Regular rhythm MS: Normal AROM mjr joints.  No obvious deformity  Abdomen: Soft.  Nondistended.  Mildly tender at incisions only.  No evidence of peritonitis.  No incarcerated hernias.  Ext:  No deformity.  No mjr edema.  No cyanosis Skin: No petechiae / purpura  Results:   Labs: Results for orders placed or performed during the hospital encounter of 03/16/2018 (from the past 48 hour(s))  Type and screen [place]     Status: None   Collection Time:  03/17/2018  1:22 PM  Result Value Ref Range   ABO/RH(D) A POS    Antibody Screen NEG    Sample Expiration      03/28/2018 Performed at [facility], [place] [address], [place], [place] [address]   Basic metabolic panel     Status: Abnormal   Collection Time: 03/26/18  4:34 AM  Result Value Ref Range   Sodium 144 135 - 145 mmol/L   Potassium 3.8 3.5  - 5.1 mmol/L   Chloride 110 98 - 111 mmol/L   CO2 25 22 - 32 mmol/L   Glucose, Bld 125 (H) 70 - 99 mg/dL   BUN 10 8 - 23 mg/dL   Creatinine, Ser 1.67 (H) 0.61 - 1.24 mg/dL   Calcium 7.9 (L) 8.9 - 10.3 mg/dL   GFR calc non Af Amer 38 (L) >60 mL/min   GFR calc Af Amer 44 (L) >60 mL/min   Anion gap 9 5 - 15    Comment: Performed at [place] [address], [place], [place] [address]  CBC     Status: Abnormal   Collection Time: 03/26/18  4:34 AM  Result Value Ref Range   WBC 13.3 (H) 4.0 - 10.5 K/uL   RBC 3.76 (L) 4.22 - 5.81 MIL/uL   Hemoglobin 10.9 (L) 13.0 - 17.0 g/dL   HCT 34.6 (L) 39.0 - 52.0 %   MCV 92.0 80.0 - 100.0 fL   MCH 29.0 26.0 - 34.0 pg   MCHC 31.5 30.0 - 36.0 g/dL   RDW 13.2 11.5 - 15.5 %   Platelets 226 150 - 400 K/uL   nRBC 0.0 0.0 - 0.2 %    Comment: Performed at [facility], [place] [address] [place], [place] [address]  Magnesium     Status: Abnormal   Collection Time: 03/26/18  4:34 AM  Result Value Ref Range   Magnesium 1.6 (L) 1.7 - 2.4 mg/dL    Comment: Performed at [facility], [place] [address] [place], [place] [address]    Imaging / Studies: No results found.  Medications / Allergies: per chart  Antibiotics: Anti-infectives (From admission, onward)   Start     Dose/Rate Route Frequency Ordered Stop   03/26/18 0800  cefoTEtan (CEFOTAN) 2 g in sodium chloride 0.9 % 100 mL IVPB     2 g 200 mL/hr over 30 Minutes Intravenous Every 12 hours 03/26/18 0258 03/26/18 2159   03/17/2018 1902  clindamycin (CLEOCIN) 900 mg, gentamicin (GARAMYCIN) 240 mg in sodium chloride 0.9 % 1,000 mL for intraperitoneal lavage  Status:  Discontinued       As needed 03/15/2018 1903 03/15/2018 1938   03/22/2018 1400  neomycin (MYCIFRADIN) tablet 1,000 mg  Status:  Discontinued     1,000 mg Oral 3 times per day 04/08/2018 0953 03/20/2018 0954   04/10/2018 1400  metroNIDAZOLE (FLAGYL) tablet 1,000 mg  Status:  Discontinued     1,000  mg Oral 3 times per day 03/30/2018 0953 04/07/2018 0954   04/08/2018 1000  cefoTEtan (CEFOTAN) 2 g in sodium chloride 0.9 % 100 mL IVPB     2 g 200 mL/hr over 30 Minutes Intravenous On call to O.R. 03/31/2018 0953 03/28/2018 1919   04/01/2018 0600  clindamycin (CLEOCIN) 900 mg, gentamicin (GARAMYCIN) 240 mg in sodium chloride 0.9 % 1,000 mL for intraperitoneal lavage  Status:  Discontinued      Intraperitoneal To Surgery 03/24/18 0802 04/06/2018 2106  Note: Portions of this report may have been transcribed using voice recognition software. Every effort was made to ensure accuracy; however, inadvertent computerized transcription errors may be present.   Any transcriptional errors that result from this process are unintentional.     Adin Hector, MD, FACS, MASCRS Gastrointestinal and Minimally Invasive Surgery    [address], [place], [place] [address] [phone] Main / Paging [phone] Fax

## 2018-03-26 NOTE — Progress Notes (Signed)
ERAS education reinforced. Did patient attend class prior to procedure? Yes [  x ] No [   ] Discussed: Pain Control [   ] Mobility [   ] Diet [   ] Other [   ]  Patient in chair asleep. Wife at bedside reports he just received something for pain.   Will follow. Pecolia Ades, RN, BSN Quality Program Coordinator, Enhanced Recovery after Surgery 03/26/18 4:53 PM

## 2018-03-27 ENCOUNTER — Inpatient Hospital Stay (HOSPITAL_COMMUNITY): Payer: Medicare Other | Admitting: Anesthesiology

## 2018-03-27 ENCOUNTER — Inpatient Hospital Stay (HOSPITAL_COMMUNITY): Payer: Medicare Other

## 2018-03-27 DIAGNOSIS — K56699 Other intestinal obstruction unspecified as to partial versus complete obstruction: Secondary | ICD-10-CM

## 2018-03-27 DIAGNOSIS — J9601 Acute respiratory failure with hypoxia: Secondary | ICD-10-CM | POA: Diagnosis not present

## 2018-03-27 DIAGNOSIS — J9602 Acute respiratory failure with hypercapnia: Secondary | ICD-10-CM

## 2018-03-27 DIAGNOSIS — N179 Acute kidney failure, unspecified: Secondary | ICD-10-CM

## 2018-03-27 DIAGNOSIS — Z978 Presence of other specified devices: Secondary | ICD-10-CM | POA: Diagnosis not present

## 2018-03-27 DIAGNOSIS — R571 Hypovolemic shock: Secondary | ICD-10-CM

## 2018-03-27 LAB — COMPREHENSIVE METABOLIC PANEL
ALT: 13 U/L (ref 0–44)
ALT: 23 U/L (ref 0–44)
ANION GAP: 18 — AB (ref 5–15)
AST: 35 U/L (ref 15–41)
AST: 79 U/L — AB (ref 15–41)
Albumin: 1.6 g/dL — ABNORMAL LOW (ref 3.5–5.0)
Albumin: 1.9 g/dL — ABNORMAL LOW (ref 3.5–5.0)
Alkaline Phosphatase: 34 U/L — ABNORMAL LOW (ref 38–126)
Alkaline Phosphatase: 24 U/L — ABNORMAL LOW (ref 38–126)
Anion gap: 11 (ref 5–15)
BILIRUBIN TOTAL: 1 mg/dL (ref 0.3–1.2)
BUN: 23 mg/dL (ref 8–23)
BUN: 31 mg/dL — AB (ref 8–23)
CO2: 12 mmol/L — ABNORMAL LOW (ref 22–32)
CO2: 19 mmol/L — ABNORMAL LOW (ref 22–32)
Calcium: 7.2 mg/dL — ABNORMAL LOW (ref 8.9–10.3)
Calcium: 7.2 mg/dL — ABNORMAL LOW (ref 8.9–10.3)
Chloride: 115 mmol/L — ABNORMAL HIGH (ref 98–111)
Chloride: 111 mmol/L (ref 98–111)
Creatinine, Ser: 2.98 mg/dL — ABNORMAL HIGH (ref 0.61–1.24)
Creatinine, Ser: 3.39 mg/dL — ABNORMAL HIGH (ref 0.61–1.24)
GFR calc Af Amer: 22 mL/min — ABNORMAL LOW (ref >60)
GFR calc Af Amer: 19 mL/min — ABNORMAL LOW (ref >60)
GFR calc non Af Amer: 19 mL/min — ABNORMAL LOW (ref >60)
GFR calc non Af Amer: 16 mL/min — ABNORMAL LOW (ref >60)
GLUCOSE: 154 mg/dL — AB (ref 70–99)
Glucose, Bld: 94 mg/dL (ref 70–99)
POTASSIUM: 4.1 mmol/L (ref 3.5–5.1)
Potassium: 3.3 mmol/L — ABNORMAL LOW (ref 3.5–5.1)
Sodium: 145 mmol/L (ref 135–145)
Sodium: 141 mmol/L (ref 135–145)
TOTAL PROTEIN: 3.5 g/dL — AB (ref 6.5–8.1)
Total Bilirubin: 1.3 mg/dL — ABNORMAL HIGH (ref 0.3–1.2)
Total Protein: 3.8 g/dL — ABNORMAL LOW (ref 6.5–8.1)

## 2018-03-27 LAB — CBC
HCT: 43.1 % (ref 39.0–52.0)
HCT: 33.7 % — ABNORMAL LOW (ref 39.0–52.0)
Hemoglobin: 12.7 g/dL — ABNORMAL LOW (ref 13.0–17.0)
Hemoglobin: 9.8 g/dL — ABNORMAL LOW (ref 13.0–17.0)
MCH: 29 pg (ref 26.0–34.0)
MCH: 29.4 pg (ref 26.0–34.0)
MCHC: 29.5 g/dL — ABNORMAL LOW (ref 30.0–36.0)
MCHC: 29.1 g/dL — ABNORMAL LOW (ref 30.0–36.0)
MCV: 98.4 fL (ref 80.0–100.0)
MCV: 101.2 fL — ABNORMAL HIGH (ref 80.0–100.0)
NRBC: 0.2 % (ref 0.0–0.2)
Platelets: 286 10*3/uL (ref 150–400)
Platelets: 217 10*3/uL (ref 150–400)
RBC: 4.38 MIL/uL (ref 4.22–5.81)
RBC: 3.33 MIL/uL — ABNORMAL LOW (ref 4.22–5.81)
RDW: 13.5 % (ref 11.5–15.5)
RDW: 13.5 % (ref 11.5–15.5)
WBC: 16 10*3/uL — ABNORMAL HIGH (ref 4.0–10.5)
WBC: 13.2 10*3/uL — ABNORMAL HIGH (ref 4.0–10.5)
nRBC: 0 % (ref 0.0–0.2)

## 2018-03-27 LAB — POTASSIUM: Potassium: 3.6 mmol/L (ref 3.5–5.1)

## 2018-03-27 LAB — HEPATIC FUNCTION PANEL
ALT: 14 U/L (ref 0–44)
AST: 35 U/L (ref 15–41)
Albumin: 1.6 g/dL — ABNORMAL LOW (ref 3.5–5.0)
Alkaline Phosphatase: 35 U/L — ABNORMAL LOW (ref 38–126)
BILIRUBIN DIRECT: 0.1 mg/dL (ref 0.0–0.2)
Indirect Bilirubin: 0.8 mg/dL (ref 0.3–0.9)
Total Bilirubin: 0.9 mg/dL (ref 0.3–1.2)
Total Protein: 3.4 g/dL — ABNORMAL LOW (ref 6.5–8.1)

## 2018-03-27 LAB — CREATININE, SERUM
Creatinine, Ser: 3.31 mg/dL — ABNORMAL HIGH (ref 0.61–1.24)
GFR calc Af Amer: 19 mL/min — ABNORMAL LOW (ref >60)
GFR calc non Af Amer: 17 mL/min — ABNORMAL LOW (ref >60)

## 2018-03-27 LAB — BLOOD GAS, ARTERIAL
Acid-base deficit: 18.9 mmol/L — ABNORMAL HIGH (ref 0.0–2.0)
Acid-base deficit: 7.2 mmol/L — ABNORMAL HIGH (ref 0.0–2.0)
Bicarbonate: 10.8 mmol/L — ABNORMAL LOW (ref 20.0–28.0)
Bicarbonate: 16.7 mmol/L — ABNORMAL LOW (ref 20.0–28.0)
Drawn by: 331471
Drawn by: 331471
FIO2: 100
FIO2: 100
MECHVT: 620 mL
MECHVT: 620 mL
O2 Saturation: 95.1 %
O2 Saturation: 96.6 %
PEEP: 5 cmH2O
PEEP: 5 cmH2O
Patient temperature: 37
Patient temperature: 37
RATE: 15 resp/min
RATE: 15 resp/min
pCO2 arterial: 41.3 mmHg (ref 32.0–48.0)
pCO2 arterial: 30.2 mmHg — ABNORMAL LOW (ref 32.0–48.0)
pH, Arterial: 7.046 — CL (ref 7.350–7.450)
pH, Arterial: 7.362 (ref 7.350–7.450)
pO2, Arterial: 117 mmHg — ABNORMAL HIGH (ref 83.0–108.0)
pO2, Arterial: 94.6 mmHg (ref 83.0–108.0)

## 2018-03-27 LAB — TROPONIN I
Troponin I: 0.04 ng/mL (ref <0.03)
Troponin I: 0.08 ng/mL (ref <0.03)
Troponin I: 0.1 ng/mL (ref <0.03)

## 2018-03-27 LAB — ECHOCARDIOGRAM COMPLETE
Height: 72 in
Weight: 2672.01 oz

## 2018-03-27 LAB — CORTISOL: Cortisol, Plasma: 79.5 ug/dL

## 2018-03-27 LAB — GLUCOSE, CAPILLARY: Glucose-Capillary: <10 mg/dL — CL (ref 70–99)

## 2018-03-27 LAB — LACTIC ACID, PLASMA
Lactic Acid, Venous: 8 mmol/L (ref 0.5–1.9)
Lactic Acid, Venous: 5.6 mmol/L (ref 0.5–1.9)

## 2018-03-27 LAB — LIPASE, BLOOD: Lipase: 107 U/L — ABNORMAL HIGH (ref 11–51)

## 2018-03-27 MED ORDER — PHENYLEPHRINE 40 MCG/ML (10ML) SYRINGE FOR IV PUSH (FOR BLOOD PRESSURE SUPPORT)
PREFILLED_SYRINGE | INTRAVENOUS | Status: DC | PRN
  Administered 2018-03-27: 80 ug via INTRAVENOUS

## 2018-03-27 MED ORDER — SODIUM BICARBONATE 8.4 % IV SOLN
INTRAVENOUS | Status: DC
  Administered 2018-03-27: 13:00:00 via INTRAVENOUS
  Filled 2018-03-27: qty 150

## 2018-03-27 MED ORDER — FENTANYL CITRATE (PF) 100 MCG/2ML IJ SOLN: 50.0000 ug | INTRAMUSCULAR | Status: DC | PRN

## 2018-03-27 MED ORDER — SODIUM BICARBONATE 8.4 % IV SOLN
INTRAVENOUS | Status: AC
  Administered 2018-03-27: 50 meq
  Filled 2018-03-27: qty 50

## 2018-03-27 MED ORDER — BISACODYL 10 MG RE SUPP: 10.0000 mg | Freq: Every day | RECTAL | Status: DC

## 2018-03-27 MED ORDER — PANTOPRAZOLE SODIUM 40 MG IV SOLR
40.0000 mg | Freq: Two times a day (BID) | INTRAVENOUS | Status: DC
  Administered 2018-03-27 – 2018-03-28 (×4): 40 mg via INTRAVENOUS
  Filled 2018-03-27 (×4): qty 40

## 2018-03-27 MED ORDER — DEXTROSE 50 % IV SOLN
1.0000 | Freq: Once | INTRAVENOUS | Status: AC
  Administered 2018-03-27: 50 mL via INTRAVENOUS

## 2018-03-27 MED ORDER — FENTANYL CITRATE (PF) 100 MCG/2ML IJ SOLN
50.0000 ug | INTRAMUSCULAR | Status: DC | PRN
  Administered 2018-03-27 (×2): 50 ug via INTRAVENOUS
  Filled 2018-03-27 (×2): qty 2

## 2018-03-27 MED ORDER — ONDANSETRON HCL 4 MG PO TABS: 4.0000 mg | ORAL_TABLET | Freq: Three times a day (TID) | ORAL | Status: DC

## 2018-03-27 MED ORDER — LACTATED RINGERS IV SOLN: INTRAVENOUS | Status: DC

## 2018-03-27 MED ORDER — ORAL CARE MOUTH RINSE
15.0000 mL | OROMUCOSAL | Status: DC
  Administered 2018-03-27 – 2018-03-28 (×14): 15 mL via OROMUCOSAL

## 2018-03-27 MED ORDER — MAGNESIUM SULFATE 2 GM/50ML IV SOLN
2.0000 g | Freq: Once | INTRAVENOUS | Status: AC
  Administered 2018-03-27: 2 g via INTRAVENOUS
  Filled 2018-03-27: qty 50

## 2018-03-27 MED ORDER — SODIUM BICARBONATE 8.4 % IV SOLN
50.0000 meq | Freq: Once | INTRAVENOUS | Status: AC
  Administered 2018-03-27: 50 meq via INTRAVENOUS

## 2018-03-27 MED ORDER — FENTANYL CITRATE (PF) 100 MCG/2ML IJ SOLN
50.0000 ug | Freq: Once | INTRAMUSCULAR | Status: AC
  Administered 2018-03-27: 50 ug via INTRAVENOUS
  Filled 2018-03-27: qty 2

## 2018-03-27 MED ORDER — MIDAZOLAM HCL 2 MG/2ML IJ SOLN
1.0000 mg | INTRAMUSCULAR | Status: DC | PRN
  Administered 2018-03-27: 1 mg via INTRAVENOUS
  Administered 2018-03-28: 2 mg via INTRAVENOUS
  Administered 2018-03-28 (×2): 1 mg via INTRAVENOUS
  Filled 2018-03-27 (×5): qty 2

## 2018-03-27 MED ORDER — ENOXAPARIN SODIUM 30 MG/0.3ML ~~LOC~~ SOLN: 30.0000 mg | SUBCUTANEOUS | Status: DC

## 2018-03-27 MED ORDER — NOREPINEPHRINE-SODIUM CHLORIDE 4-0.9 MG/250ML-% IV SOLN
0.0000 ug/min | INTRAVENOUS | Status: DC
  Administered 2018-03-27: 10 ug/min via INTRAVENOUS
  Administered 2018-03-27: 16 ug/min via INTRAVENOUS
  Administered 2018-03-28: 23 ug/min via INTRAVENOUS
  Administered 2018-03-28: 37 ug/min via INTRAVENOUS
  Administered 2018-03-28: 38 ug/min via INTRAVENOUS
  Administered 2018-03-28: 40 ug/min via INTRAVENOUS
  Administered 2018-03-28: 2 ug/min via INTRAVENOUS
  Administered 2018-03-28: 23 ug/min via INTRAVENOUS
  Administered 2018-03-28: 22 ug/min via INTRAVENOUS
  Administered 2018-03-28: 32 ug/min via INTRAVENOUS
  Administered 2018-03-28: 30 ug/min via INTRAVENOUS
  Administered 2018-03-29 (×3): 40 ug/min via INTRAVENOUS
  Filled 2018-03-27: qty 250
  Filled 2018-03-27: qty 500
  Filled 2018-03-27 (×11): qty 250

## 2018-03-27 MED ORDER — DEXTROSE-NACL 5-0.9 % IV SOLN
INTRAVENOUS | Status: DC
  Administered 2018-03-27: via INTRAVENOUS

## 2018-03-27 MED ORDER — DEXTROSE 50 % IV SOLN
INTRAVENOUS | Status: AC
  Administered 2018-03-27: 50 mL
  Filled 2018-03-27: qty 100

## 2018-03-27 MED ORDER — SODIUM CHLORIDE 0.9 % IV SOLN
INTRAVENOUS | Status: DC
  Administered 2018-03-27: 14:00:00 via INTRAVENOUS

## 2018-03-27 MED ORDER — SUCCINYLCHOLINE CHLORIDE 20 MG/ML IJ SOLN
INTRAMUSCULAR | Status: DC | PRN
  Administered 2018-03-27: 80 mg via INTRAVENOUS

## 2018-03-27 MED ORDER — BISACODYL 10 MG RE SUPP: 10.0000 mg | Freq: Two times a day (BID) | RECTAL | Status: DC | PRN

## 2018-03-27 MED ORDER — POTASSIUM CHLORIDE 10 MEQ/50ML IV SOLN
10.0000 meq | INTRAVENOUS | Status: AC
  Administered 2018-03-27 (×4): 10 meq via INTRAVENOUS
  Filled 2018-03-27: qty 50

## 2018-03-27 MED ORDER — CHLORHEXIDINE GLUCONATE 0.12% ORAL RINSE (MEDLINE KIT)
15.0000 mL | Freq: Two times a day (BID) | OROMUCOSAL | Status: DC
  Administered 2018-03-27 – 2018-03-28 (×4): 15 mL via OROMUCOSAL

## 2018-03-27 MED ORDER — VASOPRESSIN 20 UNIT/ML IV SOLN
0.0300 [IU]/min | INTRAVENOUS | Status: DC
  Administered 2018-03-27 – 2018-03-28 (×2): 0.03 [IU]/min via INTRAVENOUS
  Filled 2018-03-27 (×3): qty 2

## 2018-03-27 MED ORDER — SODIUM CHLORIDE 0.9 % IV BOLUS
1000.0000 mL | Freq: Once | INTRAVENOUS | Status: AC
  Administered 2018-03-27: 1000 mL via INTRAVENOUS

## 2018-03-27 MED ORDER — LACTATED RINGERS IV BOLUS: 1000.0000 mL | Freq: Once | INTRAVENOUS | Status: DC

## 2018-03-27 MED ORDER — KCL IN DEXTROSE-NACL 40-5-0.45 MEQ/L-%-% IV SOLN
INTRAVENOUS | Status: DC
  Administered 2018-03-27: 09:00:00 via INTRAVENOUS
  Filled 2018-03-27: qty 1000

## 2018-03-27 MED ORDER — MIDAZOLAM HCL 2 MG/2ML IJ SOLN
1.0000 mg | INTRAMUSCULAR | Status: DC | PRN
  Administered 2018-03-27 (×2): 1 mg via INTRAVENOUS
  Filled 2018-03-27 (×2): qty 2

## 2018-03-27 NOTE — Progress Notes (Signed)
Pt given order for test dose INFED 69m,  6071mhr, dose started at 2336 stayed at bedside with patient, no current reactions present. EuDawson BillsRN

## 2018-03-27 NOTE — Progress Notes (Signed)
Ricardo Villa [id number] [birth year]  CARE TEAM:  PCP: Leighton Ruff, MD  Outpatient Care Team: Patient Care Team: Leighton Ruff, MD as PCP - General (Family Medicine) Michael Boston, MD as Consulting Physician (General Surgery) Armbruster, Carlota Raspberry, MD as Consulting Physician (Gastroenterology) Dohmeier, Asencion Partridge, MD as Consulting Physician (Neurology)  Inpatient Treatment Team: Treatment Team: Attending Provider: Michael Boston, MD; Technician: Abbe Amsterdam, NT; Registered Nurse: Kerney Elbe, RN   Problem List:   Principal Problem:   Crohn's disease of both small and large intestine with intestinal obstruction [facility]) Active Problems:   Vitamin B12 deficiency   Crohn's disease ([place])   IRON DEFICIENCY ANEMIA, HX OF   History of colonic polyps   History of TIA (transient ischemic attack)   Colon polyp   CKD (chronic kidney disease), stage II   Stricture of intestine or colon ([place])   Hypomagnesemia   2 Days Post-Op  03/20/2018  POST-OPERATIVE DIAGNOSIS:   Ileocolonic anastomotic stricture  Ascending colon polyp unresectable by colonoscopy Distal Ileal stricture with enteroliths Mid ileal stricture  PROCEDURE:  XI ROBOT ASSISTED ILEOCOLECTOMY ILEAL ENTEROPLASTY ROBOTIC LYSIS OF ADHESIONS X 4 HOURS (2/3 CASE)  SURGEON:  Adin Hector, MD   Assessment  Recovering with partial ileus  [facility] Stay = 2 days)  Plan:  -Keep on clears for now.  If has repeated emesis, place NG tube.  The fact that he is having bowel movements now agrees that most likely his ileus is starting to resolve.  Standing Zofran and nausea medications for 24 hours.  See if that helps improve things.   -f/u pathology -Low-dose half maintenance IV fluids with albumin bolus backup.   -IV iron last night for presumed iron deficiency anemia to help turn things around and avoid transfusion. -follow I&O closely w CKD  -BP control -PRN blood pressure control.  Yesterday he  was orthostatic.  Crystalloid and iron given.  overall I's and O's are positive.  Switch to colloid as needed over the weekend and regroup. -vitB12 replacement -VTE prophylaxis- SCDs, etc. continue Lovenox.  If hemoglobin low, will hold and reevaluate. -mobilize as tolerated to help recovery  20 minutes spent in review, evaluation, examination, counseling, and coordination of care.  More than 50% of that time was spent in counseling.  I updated the patient's status to the patient and nurse.  Recommendations were made.  Questions were answered.  They expressed understanding & appreciation.   03/27/2018    Subjective: (Chief complaint)  Crampy abdominal pain.  Emesis.  Felt better.  Having diarrhea on toilet now.  Denies abdominal pain or nausea right now.  Tired overall though.  Objective:  Vital signs:  Vitals:   03/27/18 0000 03/27/18 0128 03/27/18 0558 03/27/18 0601  BP: (!) 99/55 100/61 120/82   Pulse: (!) 113 (!) 109 (!) 118   Resp:  18    Temp: 98.4 F (36.9 C) 98.9 F (37.2 C) 98 F (36.7 C)   TempSrc: Axillary Axillary Oral   SpO2:  96% (!) 89% 94%  Weight:      Height:        Last BM Date: 03/24/18  Intake/Output   Yesterday:  01/16 0701 - 01/17 0700 In: 1916.6 [P.O.:840; IV Piggyback:1076.6] Out: 950 [Urine:450; Emesis/NG output:500] This shift:  No intake/output data recorded.  Bowel function:  Flatus: YES  BM:  YES  Drain: (No drain)   Physical Exam:  General: Pt awake/alert/oriented x4 in mild acute distress Eyes: PERRL, normal EOM.  Sclera clear.  No icterus Neuro: CN II-XII intact w/o focal sensory/motor deficits. Lymph: No head/neck/groin lymphadenopathy Psych:  No delerium/psychosis/paranoia HENT: Normocephalic, Mucus membranes moist.  No thrush Neck: Supple, No tracheal deviation Chest: No chest wall pain w good excursion CV:  Pulses intact.  Regular rhythm MS: Normal AROM mjr joints.  No obvious deformity  Abdomen: Soft.  Mildy  distended.  Mildly tender at incisions only.  No guarding or peritonitis at this time.  No evidence of peritonitis.  No incarcerated hernias.  Ext:  No deformity.  No mjr edema.  No cyanosis Skin: No petechiae / purpura  Results:   Labs: Results for orders placed or performed during the hospital encounter of 03/27/2018 (from the past 48 hour(s))  ABO/Rh     Status: None   Collection Time: 04/04/2018 11:50 AM  Result Value Ref Range   ABO/RH(D)      A POS Performed at [facility], [place] [address], [place], [place] [address]   Type and screen [place]     Status: None   Collection Time: 03/13/2018  1:22 PM  Result Value Ref Range   ABO/RH(D) A POS    Antibody Screen NEG    Sample Expiration      03/28/2018 Performed at [facility], [place] [address], [place], [place] [address]   Basic metabolic panel     Status: Abnormal   Collection Time: 03/26/18  4:34 AM  Result Value Ref Range   Sodium 144 135 - 145 mmol/L   Potassium 3.8 3.5 - 5.1 mmol/L   Chloride 110 98 - 111 mmol/L   CO2 25 22 - 32 mmol/L   Glucose, Bld 125 (H) 70 - 99 mg/dL   BUN 10 8 - 23 mg/dL   Creatinine, Ser 1.67 (H) 0.61 - 1.24 mg/dL   Calcium 7.9 (L) 8.9 - 10.3 mg/dL   GFR calc non Af Amer 38 (L) >60 mL/min   GFR calc Af Amer 44 (L) >60 mL/min   Anion gap 9 5 - 15    Comment: Performed at [place] [address] [place], [place] [address]  CBC     Status: Abnormal   Collection Time: 03/26/18  4:34 AM  Result Value Ref Range   WBC 13.3 (H) 4.0 - 10.5 K/uL   RBC 3.76 (L) 4.22 - 5.81 MIL/uL   Hemoglobin 10.9 (L) 13.0 - 17.0 g/dL   HCT 34.6 (L) 39.0 - 52.0 %   MCV 92.0 80.0 - 100.0 fL   MCH 29.0 26.0 - 34.0 pg   MCHC 31.5 30.0 - 36.0 g/dL   RDW 13.2 11.5 - 15.5 %   Platelets 226 150 - 400 K/uL   nRBC 0.0 0.0 - 0.2 %    Comment: Performed at [facility], [place] [address], [place], [place] [address]  Magnesium      Status: Abnormal   Collection Time: 03/26/18  4:34 AM  Result Value Ref Range   Magnesium 1.6 (L) 1.7 - 2.4 mg/dL    Comment: Performed at [facility], [place] [address] [place], [place] [address]    Imaging / Studies: No results found.  Medications / Allergies: per chart  Antibiotics: Anti-infectives (From admission, onward)   Start     Dose/Rate Route Frequency Ordered Stop   03/26/18 0800  cefoTEtan (CEFOTAN) 2 g in sodium chloride 0.9 % 100 mL IVPB     2 g 200 mL/hr  over 30 Minutes Intravenous Every 12 hours 03/26/18 0258 03/26/18 1127   04/06/2018 1902  clindamycin (CLEOCIN) 900 mg, gentamicin (GARAMYCIN) 240 mg in sodium chloride 0.9 % 1,000 mL for intraperitoneal lavage  Status:  Discontinued       As needed 04/09/2018 1903 03/13/2018 1938   04/09/2018 1400  neomycin (MYCIFRADIN) tablet 1,000 mg  Status:  Discontinued     1,000 mg Oral 3 times per day 04/10/2018 0953 03/14/2018 0954   03/21/2018 1400  metroNIDAZOLE (FLAGYL) tablet 1,000 mg  Status:  Discontinued     1,000 mg Oral 3 times per day 04/06/2018 0953 04/07/2018 0954   04/07/2018 1000  cefoTEtan (CEFOTAN) 2 g in sodium chloride 0.9 % 100 mL IVPB     2 g 200 mL/hr over 30 Minutes Intravenous On call to O.R. 03/26/2018 0953 04/06/2018 1919   03/24/2018 0600  clindamycin (CLEOCIN) 900 mg, gentamicin (GARAMYCIN) 240 mg in sodium chloride 0.9 % 1,000 mL for intraperitoneal lavage  Status:  Discontinued      Intraperitoneal To Surgery 03/24/18 0802 04/07/2018 2106        Note: Portions of this report may have been transcribed using voice recognition software. Every effort was made to ensure accuracy; however, inadvertent computerized transcription errors may be present.   Any transcriptional errors that result from this process are unintentional.     Adin Hector, MD, FACS, MASCRS Gastrointestinal and Minimally Invasive Surgery    [address] [place], [place] [address] [phone] Main /  Paging [phone] Fax

## 2018-03-27 NOTE — Progress Notes (Signed)
Pt. Was on the floor using restroom two days post op. Pt. Stood up to be cleaned by nurse and sat back down. Pt. Then became unresponsive lost his pulse and was not breathing. Nurses performed two rounds of chest compressions. Pt. Was intubated and taken to 1231 around 1130. Pt. Was unstable with vital signs of 60's/30's Pt. Received a total of 3 NS bolus's and is receiving another one at this time. Pt. Then received 1 amp of bicarb at 1200.Pt. Received a central line and art line at 1230. Pt. Placed on levophed to raise BP at 1245 Pt. Then started on vasopressin at 0430. Pt. BP is 120's/60's currently. PT. Remains stable and RN will continue to monitor closely.

## 2018-03-27 NOTE — Progress Notes (Signed)
Unsuccessful PIV attempt x1. R AC line remains patent. Pt will have central line placed.

## 2018-03-27 NOTE — Progress Notes (Signed)
CRITICAL VALUE ALERT  Critical Value: Troponin 0.04  Date & Time Notied:  03/27/18 1230  Provider Notified: [name]  Orders Received/Actions taken: CCM made aware no new orders.

## 2018-03-27 NOTE — Anesthesia Postprocedure Evaluation (Signed)
Anesthesia Post Note  Patient: HYLAND MOLLENKOPF  Procedure(s) Performed: XI ROBOT ASSISTED PROXIMAL COLECTOMY WITH LYSIS OF ADHESIONS (N/A Abdomen)     Patient location during evaluation: PACU Anesthesia Type: General Level of consciousness: awake and alert Pain management: pain level controlled Vital Signs Assessment: post-procedure vital signs reviewed and stable Respiratory status: spontaneous breathing, nonlabored ventilation, respiratory function stable and patient connected to nasal cannula oxygen Cardiovascular status: blood pressure returned to baseline and stable Postop Assessment: no apparent nausea or vomiting Anesthetic complications: no    Last Vitals:  Vitals:   03/27/18 0558 03/27/18 0601  BP: 120/82   Pulse: (!) 118   Resp:    Temp: 36.7 C   SpO2: (!) 89% 94%    Last Pain:  Vitals:   03/27/18 0740  TempSrc:   PainSc: 0-No pain                 Montez Hageman

## 2018-03-27 NOTE — Progress Notes (Signed)
Entered patient's room to hang albumin and found patient in the bathroom. He had made a bowel movement and stood up for me to clean him twice. Patient became unresponsive and code blue was called. With assistance of NT Amy, we lowered him to the floor to begin CPR. Code team arrived and patient was transferred to ICU. Report given to receiving RN. Donne Hazel, RN

## 2018-03-27 NOTE — Procedures (Signed)
Central Venous Catheter Insertion Procedure Note Ricardo Villa [id number] [birth year]  Procedure: Insertion of Central Venous Catheter Indications: Assessment of intravascular volume, Drug and/or fluid administration and Frequent blood sampling  Procedure Details Consent: Risks of procedure as well as the alternatives and risks of each were explained to the (patient/caregiver).  Consent for procedure obtained. Time Out: Verified patient identification, verified procedure, site/side was marked, verified correct patient position, special equipment/implants available, medications/allergies/relevent history reviewed, required imaging and test results available.  Performed  Maximum sterile technique was used including antiseptics, cap, gloves, gown, hand hygiene, mask and sheet. Skin prep: Chlorhexidine; local anesthetic administered A antimicrobial bonded/coated triple lumen catheter was placed in the left internal jugular vein using the Seldinger technique. Catheter placed to 20 cm. Blood aspirated via all 3 ports and then flushed x 3. Line sutured x 2 and dressing applied.  Ultrasound guidance used.Yes.    Evaluation Blood flow good Complications: No apparent complications Patient did tolerate procedure well. Chest X-ray ordered to verify placement.  CXR: pending.   [name], AGACNP-BC [place] Pager [phone] or [phone]  03/27/2018 11:56 AM

## 2018-03-27 NOTE — Care Management Important Message (Signed)
Important Message  Patient Details  Name: Ricardo Villa MRN: [id number] Date of Birth: [birth year]   Medicare Important Message Given:  Yes    Stefana Lodico 03/27/2018, 9:13 AM

## 2018-03-27 NOTE — Progress Notes (Addendum)
Pulmonary/ CCM pm note  Echo ok    Intake/Output Summary (Last 24 hours) at 03/27/2018 1743 Last data filed at 03/27/2018 1700 Gross per 24 hour  Intake 2521.46 ml  Output 702 ml  Net 1819.46 ml     CVP:  [11 mmHg-13 mmHg] 13 mmHg    Still on levophen drip > rec ns x 1 liter additional bolus    Christinia Gully, MD Pulmonary and [place] [phone] After 5:30 PM or weekends, use Bee[facility] [phone]

## 2018-03-27 NOTE — Consult Note (Signed)
NAME:  Ricardo Villa, MRN:  [id number], DOB:  1934/06/22, LOS: 2 ADMISSION DATE:  03/16/2018, CONSULTATION DATE:  1/17 REFERRING MD:  Dr. Johney Maine, CHIEF COMPLAINT:  Cardiac arrest   Brief History     History of present illness   84 year old male with past medical history as below, which is significant for bowel obstruction with ileal stricture.  In the past this is resolved without surgery, but he did require admission for bowel rest and IV fluids.  During recent colonoscopy was found to have significant stricture and a large polyp in the ascending colon.  Seeing his this is been the site of a prior polyp more definitive intervention was requested and he presented on 1/15 for robot assisted ileocolectomy, ileal enteroplasty, and lysis of adhesions.  He was in the ER for 4 hours under Dr. Johney Maine. In the immediate post operative period he was progressing as expected. He had been started on a diet and had been ambulating. In the AM hours of 1/17 after using the commode and standing up, he suffered a syncopal event, which resulted in loss of pulse. CPR/ACLS was initiated and persisted for only a couple of minutes. He was intubated for airway protection and transferred to [facility] ICU for further monitoring.    Past Medical History   has a past medical history of Acute renal failure ([place]) (03/16/2014), Allergy, Cataract, Crohn disease ([place]), Dehydration (03/01/2013), Diverticulosis, GERD (gastroesophageal reflux disease), Glaucoma, History of adenomatous polyp of colon, Hyperhomocysteinemia ([place]), Hyperlipidemia, PONV (postoperative nausea and vomiting), Prostate cancer ([place]), SBO (small bowel obstruction) ([place]) (03/17/2014), Small bowel obstruction ([place]), TIA, TIA (transient ischemic attack), and Vitamin B12 deficiency.  Significant Hospital Events   1/15 admit for elective bowel surgery 1/17 brief cardiac arrest after bowel movement. Intubated, Tx to ICU.   Consults:    Procedures:    Ileocolectomy ([place]ross) 1/15  ETT 1/17>>> LIJ CVL 1/17 > L rad art 1/17>  Significant Diagnostic Tests:  10/8 Colonoscopy: Stricture in the terminal ileum, not able to be traversed, fibrotic. Mucosa near the surgical anastomosis as outlined - unclear if normal variant versus flat polypoid lesion. Biopsied. Post-polypectomy scar at the hepatic flexure. Colonic spasm  Micro Data:    Antimicrobials:  Cefotetan 1/15 > Clindamycin 1/15 Gentamicin 1/15   Interim history/subjective:  Unable as patient is encephalopathic and intubated.   Objective   Blood pressure 120/82, pulse (!) 118, temperature 98 F (36.7 C), temperature source Oral, resp. rate 18, height 6' (1.829 m), weight 75.8 kg, SpO2 94 %.    FiO2 (%):  [100 %] 100 % Set Rate:  [15 bmp] 15 bmp Vt Set:  [620 mL] 620 mL PEEP:  [5 cmH20] 5 cmH20 Plateau Pressure:  [16 cmH20-18 cmH20] 18 cmH20   Intake/Output Summary (Last 24 hours) at 03/27/2018 1202 Last data filed at 03/27/2018 0840 Gross per 24 hour  Intake 1796.62 ml  Output 700 ml  Net 1096.62 ml   Filed Weights   03/13/2018 0959  Weight: 75.8 kg    Examination: General: frail, elderly male lying in bed on vent  HEENT: MM pink/dry, dried dark emesis on face  Neuro: Awakens to voice, makes eye contact, nods appropriately to yes/no questions  CV: s1s2 rrr, no m/r/g PULM: even/non-labored, lungs bilaterally clear  GI: soft, laparoscopic sites dressed c/d/i, decreased BS  Extremities: warm/dry, no edema  Skin: no rashes or lesions  Resolved Hospital Problem list     Assessment & Plan:   Vasovagal vs Cardiac Arrest  -  brief LOC while on toilet, brief CPR with ROSC P: ICU monitoring  Assess EKG, troponin, lactate, CBC, BMP Assess ECHO  Not a cooling candidate as mental status returned to baseline post event   Shock -suspect hypovolemic based on [place] assessment of IJ's, r/o sepsis, r/o adrenal insufficiency  P: Place aline & central line  Assess CVP Q4   Assess cortisol   Acute Hypoxemic Respiratory Failure  P: PRVC 8 cc/kg  Wean PEEP / FiO2 for sats >90% Follow intermittent CXR   AG/NG Metabolic Acidosis  AKI  Hypokalemia  P: Bicarb gtt at 46m/hr  Follow ABG  Trend BMP / urinary output Replace electrolytes as indicated, KCL 1/17 Avoid nephrotoxic agents, ensure adequate renal perfusion  S/P Ileocolectomy for Recurrent Polyps Chron's Disease   P: Post operative care per CCS  Assess KUB   Acute Metabolic Encephalopathy P: Minimize sedation as able  PRN fentanyl / versed while on vent  Frequent neuro exams  Early PT efforts   At Risk Malnutrition  P: Consider TF in am 1/18  PPI   Hyperglycemia  P: SSI   Best practice:  Diet: NPO Pain/Anxiety/Delirium protocol (if indicated): NA VAP protocol (if indicated): per protocol DVT prophylaxis: SCDs post op GI prophylaxis: PPI Glucose control: monitor on labs Mobility: BR Code Status: FULL Family Communication: Wife and daughter updated Disposition: ICU  Labs   CBC: Recent Labs  Lab 03/26/18 0434 03/27/18 0749 03/27/18 1038  WBC 13.3* 16.0* 13.2*  HGB 10.9* 12.7* 9.8*  HCT 34.6* 43.1 33.7*  MCV 92.0 98.4 101.2*  PLT 226 286 2440   Basic Metabolic Panel: Recent Labs  Lab 03/26/18 0434 03/27/18 0749 03/27/18 1038  NA 144  --  145  K 3.8 3.6 3.3*  CL 110  --  115*  CO2 25  --  12*  GLUCOSE 125*  --  154*  BUN 10  --  23  CREATININE 1.67* 3.31* 2.98*  CALCIUM 7.9*  --  7.2*  MG 1.6*  --   --    GFR: Estimated Creatinine Clearance: 20.8 mL/min (A) (by C-G formula based on SCr of 2.98 mg/dL (H)). Recent Labs  Lab 03/26/18 0434 03/27/18 0749 03/27/18 1038  WBC 13.3* 16.0* 13.2*    Liver Function Tests: Recent Labs  Lab 03/27/18 1038  AST 35  35  ALT 13  14  ALKPHOS 34*  35*  BILITOT 1.0  0.9  PROT 3.5*  3.4*  ALBUMIN 1.6*  1.6*   Recent Labs  Lab 03/27/18 1038  LIPASE 107*   No results for input(s): AMMONIA in  the last 168 hours.  ABG    Component Value Date/Time   PHART 7.046 (LL) 03/27/2018 1042   PCO2ART 41.3 03/27/2018 1042   PO2ART 117 (H) 03/27/2018 1042   HCO3 10.8 (L) 03/27/2018 1042   ACIDBASEDEF 18.9 (H) 03/27/2018 1042   O2SAT 95.1 03/27/2018 1042     Coagulation Profile: No results for input(s): INR, PROTIME in the last 168 hours.  Cardiac Enzymes: Recent Labs  Lab 03/27/18 1038  TROPONINI 0.04*    HbA1C: Hgb A1c MFr Bld  Date/Time Value Ref Range Status  03/19/2018 09:08 AM 5.2 4.8 - 5.6 % Final    Comment:    (NOTE) Pre diabetes:          5.7%-6.4% Diabetes:              >6.4% Glycemic control for   <7.0% adults with diabetes     CBG:  No results for input(s): GLUCAP in the last 168 hours.  Review of Systems:   Patient is encephalopathic and/or intubated. Therefore history has been obtained from chart review.   Past Medical History  He,  has a past medical history of Acute renal failure ([place]) (03/16/2014), Allergy, Cataract, Crohn disease ([place]), Dehydration (03/01/2013), Diverticulosis, GERD (gastroesophageal reflux disease), Glaucoma, History of adenomatous polyp of colon, Hyperhomocysteinemia ([place]), Hyperlipidemia, PONV (postoperative nausea and vomiting), Prostate cancer ([place]), SBO (small bowel obstruction) ([place]) (03/17/2014), Small bowel obstruction ([place]), TIA, TIA (transient ischemic attack), and Vitamin B12 deficiency.   Surgical History    Past Surgical History:  Procedure Laterality Date  . APPENDECTOMY    . BIOPSY  12/16/2017   Procedure: BIOPSY;  Surgeon: Yetta Flock, MD;  Location: [facility];  Service: Gastroenterology;;  . CATARACT EXTRACTION     both eyes  . CHOLECYSTECTOMY    . COLONOSCOPY    . COLONOSCOPY WITH PROPOFOL N/A 07/24/2015   Procedure: COLONOSCOPY WITH PROPOFOL;  Surgeon: Manus Gunning, MD;  Location: [facility];  Service: Gastroenterology;  Laterality: N/A;  . COLONOSCOPY WITH PROPOFOL N/A 12/12/2015    Procedure: COLONOSCOPY WITH PROPOFOL;  Surgeon: Manus Gunning, MD;  Location: [facility];  Service: Gastroenterology;  Laterality: N/A;  . COLONOSCOPY WITH PROPOFOL N/A 12/16/2017   Procedure: COLONOSCOPY WITH PROPOFOL;  Surgeon: Yetta Flock, MD;  Location: [facility];  Service: Gastroenterology;  Laterality: N/A;  . HOT HEMOSTASIS N/A 12/12/2015   Procedure: HOT HEMOSTASIS (ARGON PLASMA COAGULATION/BICAP);  Surgeon: Manus Gunning, MD;  Location: [facility];  Service: Gastroenterology;  Laterality: N/A;  . POLYPECTOMY    . terminal ileum resection    . TONSILLECTOMY    . VASECTOMY       Social History   reports that he quit smoking about 29 years ago. His smoking use included cigarettes. He has never used smokeless tobacco. He reports current alcohol use. He reports that he does not use drugs.   Family History   His family history includes Crohn's disease in his mother; Diabetes in his paternal grandfather; Heart disease in his father; Stroke in his father. There is no history of Colon cancer, Esophageal cancer, Rectal cancer, Stomach cancer, or Colon polyps.   Allergies No Known Allergies   Home Medications  Prior to Admission medications   Medication Sig Start Date End Date Taking? Authorizing Provider  aspirin EC 81 MG tablet Take 81 mg by mouth daily.   Yes [provider]  calcium carbonate (TUMS - DOSED IN MG ELEMENTAL CALCIUM) 500 MG chewable tablet Chew 1 tablet by mouth daily as needed for indigestion or heartburn.    Yes [provider]  diphenoxylate-atropine (LOMOTIL) 2.5-0.025 MG tablet TAKE ONE TABLET 1 OR 2 TIMES DAILY AS NEEDED FOR DIARRHEA OR LOOSE STOOLS Patient taking differently: Take 1 tablet by mouth daily.  03/09/18  Yes Armbruster, Carlota Raspberry, MD  fenofibrate 54 MG tablet Take 54 mg by mouth daily.   Yes [provider]  guaiFENesin (MUCINEX) 600 MG 12 hr tablet Take 600 mg by mouth 2 (two) times daily as  needed for cough or to loosen phlegm.   Yes [provider]  IRON PO Take 1 tablet by mouth daily.   Yes [provider]  latanoprost (XALATAN) 0.005 % ophthalmic solution Place 1 drop into both eyes at bedtime.   Yes [provider]  Multiple Vitamins-Minerals (PRESERVISION AREDS 2 PO) Take 1 tablet by mouth 2 (two) times daily.  Yes [provider]  vitamin B-12 (CYANOCOBALAMIN) 1000 MCG tablet Take 1,000 mcg by mouth every 7 (seven) days.    Yes [provider]     Critical care time: 60 mins     [name], AGACNP-BC [place] Pager [phone] or [phone]  03/27/2018 12:19 PM

## 2018-03-27 NOTE — Progress Notes (Signed)
CRITICAL VALUE ALERT  Critical Value:  LA 5.6  Date & Time Notied:  03/27/2018 1830  Provider Notified: [name]  Orders Received/Actions taken: CCM made aware LA is trending in the right direction.

## 2018-03-27 NOTE — Progress Notes (Addendum)
Ricardo Villa  [birth year] [id number]  Patient Care Team: Leighton Ruff, MD as PCP - General (Family Medicine) Michael Boston, MD as Consulting Physician (General Surgery) Armbruster, Carlota Raspberry, MD as Consulting Physician (Gastroenterology) Dohmeier, Asencion Partridge, MD as Consulting Physician (Neurology)   Pathology consistent with ileal stricturing including at the old ileocolonic anastomosis.  Ileal diverticulum development at a severe stricture causing stones as well mucosal changes in the ileocolonic region.  I called and discussed the head of pathology that personally read the specimen, Dr. Lyndon Code.  He did confirm he saw grossly and microscopically an adenomatous polyp in the colon (just distal to the anastomosis at prior tattooed location by gastroenterology in October) that appears to be a benign adenomatous polyp with no cancer.  Patient more alert.  Follows basic commands.  No definite focal deficits.  Somewhat confused but consolable.  Critical care nurse practitioner and nursing at bedside.  Arterial line just placed.  Chest x-ray shows no obvious pneumothorax or definite aspiration at this time.  EKG was sinus tachycardia.  Abdomen mains only mildly distended.  No peritonitis or guarding to palpation.  Not worse.  Mildly hypotensive.  Giving volume.  Hemoglobin 9 which is probably more accurate than the last 12.  Follow closely.  Orogastric tube with dark green almost black output.  Placed on Protonix for now under presumption of perhaps having some gastritis.  Okay to anticoagulate given his history of transient ischemic attacks from a surgical standpoint.  I had discussed with Dr. Melvyn Novas.  Transfuse as needed hemoglobin less than 7 or persistent shock.  Critical care help appreciated.  CCS to follow closely  Patient Active Problem List   Diagnosis Date Noted  . Crohn's disease of both small and large intestine with intestinal obstruction ([place]) 06/09/2014    Priority: High  . 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 Stricture of mid ileum s/p robotic enteroplasty 03/28/2018 03/27/2018    Priority: Medium  . Ileocolic anastomositic stricture s/p robotic resection 04/04/2018 03/20/2018    Priority: Medium  . Ascending colon polyp s/p robotic resection 03/14/2018     Priority: Medium  . AKI (acute kidney injury) ([place]) 12/10/2012    Priority: Medium  . Hypomagnesemia 03/26/2018  . CKD (chronic kidney disease), stage II 04/14/2016  . Hypernatremia   . Hypokalemia   . Leucocytosis 03/16/2014  . History of TIA (transient ischemic attack) 03/01/2013  . Vitamin B12 deficiency 03/23/2008  . 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 HYPERLIPIDEMIA 03/23/2008  . IRON DEFICIENCY ANEMIA, HX OF 03/23/2008  . History of colonic polyps 03/23/2008    Past Medical History:  Diagnosis Date  . Acute renal failure ([place]) 03/16/2014  . Allergy   . Cataract    been removed   . Crohn disease ([place])   . Dehydration 03/01/2013   Diarrhea due to crohn's disease.   . Diverticulosis   . GERD (gastroesophageal reflux disease)   . Glaucoma   . History of adenomatous polyp of colon   . Hyperhomocysteinemia ([place])   . Hyperlipidemia   . PONV (postoperative nausea and vomiting)    "due to lots of mucus and phelgm in throat"-all with colonoscopy and years ago with gall bladder surgery   . Prostate cancer [facility])    "watchful waiting"- rechecks every 3- 6 months- Dr. Alinda Money  . SBO (small bowel obstruction) ([place]) 03/17/2014  . Small bowel obstruction ([place])   . TIA    mini stroke    .  TIA (transient ischemic attack)    HX OF   . Vitamin B12 deficiency     Past Surgical History:  Procedure Laterality Date  . APPENDECTOMY    . BIOPSY  12/16/2017   Procedure: BIOPSY;  Surgeon: Yetta Flock, MD;  [facility];  Service: Gastroenterology;;  . CATARACT EXTRACTION     both eyes  . CHOLECYSTECTOMY    . COLONOSCOPY    . COLONOSCOPY  WITH PROPOFOL N/A 07/24/2015   Procedure: COLONOSCOPY WITH PROPOFOL;  Surgeon: Manus Gunning, MD;  [facility];  Service: Gastroenterology;  Laterality: N/A;  . COLONOSCOPY WITH PROPOFOL N/A 12/12/2015   Procedure: COLONOSCOPY WITH PROPOFOL;  Surgeon: Manus Gunning, MD;  [facility];  Service: Gastroenterology;  Laterality: N/A;  . COLONOSCOPY WITH PROPOFOL N/A 12/16/2017   Procedure: COLONOSCOPY WITH PROPOFOL;  Surgeon: Yetta Flock, MD;  [facility];  Service: Gastroenterology;  Laterality: N/A;  . HOT HEMOSTASIS N/A 12/12/2015   Procedure: HOT HEMOSTASIS (ARGON PLASMA COAGULATION/BICAP);  Surgeon: Manus Gunning, MD;  Location: [facility];  Service: Gastroenterology;  Laterality: N/A;  . POLYPECTOMY    . terminal ileum resection    . TONSILLECTOMY    . VASECTOMY      Social History   Socioeconomic History  . Marital status: Married    Spouse name: Occupational hygienist  . Number of children: 2  . Years of education: college  . Highest education level: Not on file  Occupational History  . Occupation: Retired  Scientific laboratory technician  . Financial resource strain: Not on file  . Food insecurity:    Worry: Not on file    Inability: Not on file  . Transportation needs:    Medical: Not on file    Non-medical: Not on file  Tobacco Use  . Smoking status: Former Smoker    Types: Cigarettes    Last attempt to quit: 07/16/1988    Years since quitting: 29.7  . Smokeless tobacco: Never Used  Substance and Sexual Activity  . Alcohol use: Yes    Alcohol/week: 0.0 standard drinks    Comment: occasionally to rare  . Drug use: No  . Sexual activity: Not Currently  Lifestyle  . Physical activity:    Days per week: Not on file    Minutes per session: Not on file  . Stress: Not on file  Relationships  . Social connections:    Talks on phone: Not on file    Gets together: Not on file    Attends religious service: Not on file    Active member of  club or organization: Not on file    Attends meetings of clubs or organizations: Not on file    Relationship status: Not on file  . Intimate partner violence:    Fear of current or ex partner: Not on file    Emotionally abused: Not on file    Physically abused: Not on file    Forced sexual activity: Not on file  Other Topics Concern  . Not on file  Social History Narrative   Caffeine 2 cups daily. De caff tea    Family History  Problem Relation Age of Onset  . Crohn's disease Mother   . Heart disease Father   . Stroke Father   . Diabetes Paternal Grandfather   . Colon cancer Neg Hx   . Esophageal cancer Neg Hx   . Rectal cancer Neg Hx   . Stomach cancer Neg Hx   .  Colon polyps Neg Hx     Current Facility-Administered Medications  Medication Dose Route Frequency Provider Last Rate Last Dose  . 0.9 %  sodium chloride infusion   Intravenous Continuous Christinia Gully B, MD      . albumin human 5 % solution 12.5 g  12.5 g Intravenous Q6H PRN Michael Boston, MD      . alum & mag hydroxide-simeth (MAALOX/MYLANTA) 200-200-20 MG/5ML suspension 30 mL  30 mL Oral Q6H PRN Michael Boston, MD      . bisacodyl (DULCOLAX) suppository 10 mg  10 mg Rectal Daily Michael Boston, MD      . diphenhydrAMINE (BENADRYL) injection 12.5 mg  12.5 mg Intravenous Q6H PRN Michael Boston, MD      . hydrALAZINE (APRESOLINE) injection 10 mg  10 mg Intravenous Q2H PRN Michael Boston, MD      . HYDROmorphone (DILAUDID) injection 0.5-2 mg  0.5-2 mg Intravenous Q4H PRN Michael Boston, MD   2 mg at 03/26/18 1621  . lactated ringers bolus 1,000 mL  1,000 mL Intravenous Once Tanda Rockers, MD      . latanoprost (XALATAN) 0.005 % ophthalmic solution 1 drop  1 drop Both Eyes Ardeen Fillers, MD   1 drop at 03/26/18 2139  . magnesium sulfate IVPB 2 g 50 mL  2 g Intravenous Once Ollis, Brandi L, NP      . metoCLOPramide (REGLAN) injection 10 mg  10 mg Intravenous Q6H PRN Michael Boston, MD      . metoprolol tartrate  (LOPRESSOR) injection 5 mg  5 mg Intravenous Q6H PRN Michael Boston, MD      . ondansetron (ZOFRAN) injection 4 mg  4 mg Intravenous Q6H PRN Michael Boston, MD      . pantoprazole (PROTONIX) injection 40 mg  40 mg Intravenous Gorden Harms, MD      . prochlorperazine (COMPAZINE) injection 5-10 mg  5-10 mg Intravenous Q6H PRN Michael Boston, MD      . sodium bicarbonate 1 mEq/mL injection           . sodium bicarbonate injection 50 mEq  50 mEq Intravenous Once Noe Gens L, NP         No Known Allergies  BP 120/82 (BP Location: Right Arm)   Pulse (!) 118   Temp 98 F (36.7 C) (Oral)   Resp 18   Ht 6' (1.829 m)   Wt 75.8 kg   SpO2 94%   BMI 22.65 kg/m   Dg Chest Port 1 View  Result Date: 03/27/2018 CLINICAL DATA:  Post intubation. EXAM: PORTABLE CHEST 1 VIEW COMPARISON:  Radiographs 07/29/2013. FINDINGS: 1006 hours. Endotracheal tube is in the mid trachea, tip 4.0 cm above the carina. Enteric tube projects below the diaphragm, tip not visualized. The heart size and mediastinal contours are stable. There are lower lung volumes with new right greater than left perihilar and lower lobe airspace opacities. No pneumothorax or large pleural effusion identified. Multiple telemetry leads overlie the chest. There appears to be some soft tissue emphysema within the left lateral chest wall and neck bilaterally. This is presumably related to recent abdominal surgery. IMPRESSION: 1. Satisfactory position of the endotracheal and enteric tubes. 2. Bibasilar atelectasis or infiltrates. 3. Soft tissue emphysema without evidence of pneumothorax. Electronically Signed   By: Richardean Sale M.D.   On: 03/27/2018 10:54    Note: This dictation was prepared with Dragon/digital dictation along with [employer]. Any transcriptional errors that result from this process are  unintentional.   .Adin Hector, M.D., F.A.C.S. Gastrointestinal and Minimally Invasive Surgery [facility] [address], [place], [place] [address] [phone] Main / Paging  03/27/2018 11:30 AM

## 2018-03-27 NOTE — Progress Notes (Signed)
  Echocardiogram 2D Echocardiogram has been performed.  Ricardo Villa 03/27/2018, 3:03 PM

## 2018-03-27 NOTE — Progress Notes (Signed)
Pt. Has had 4 liquid stools today RN spoke with surgery to see if a flexiseal would be appropriate after the GI surgery he had. RN was told this was not possible. RN was then notified by family that the pt. Usually takes Lomotil at home for his recurrent diarrhea resulting from Chron's. CCM made aware and does not want to start that at this time. Will re-visit tomorrow. RN will pass on to next nurse.

## 2018-03-27 NOTE — Progress Notes (Signed)
CRITICAL VALUE ALERT  Critical Value: LA 8.1  Date & Time Notied:  03/27/18 0430  Provider Notified: [name]  Orders Received/Actions taken: No new orders CCM notified.

## 2018-03-27 NOTE — Progress Notes (Addendum)
Ricardo Villa  1934-10-22 [id number]  Patient Care Team: Leighton Ruff, MD as PCP - General (Family Medicine) Michael Boston, MD as Consulting Physician (General Surgery) Armbruster, Carlota Raspberry, MD as Consulting Physician (Gastroenterology) Dohmeier, Asencion Partridge, MD as Consulting Physician (Neurology)  Paged by Modena Jansky, PA with our group was I was assisting on a case in the OR.  He heard code called overhead.  Patient was on commode with dark green bowel movements, feeling like he was cleaning himself out..  Denied abdominal pain through the morning.  No more emesis.  No nausea.  Not particularly hungry though.  His nurse, Engineer, agricultural, was helping patient transfer from commode back to bed when the patient passed out.  Gently brought to floor.  Not responsive.  Code called.  Intubated.  Emesis at the time of intubation.  Nothing definite going into the trachea though.  Had to be paralyzed for the code.  Chest compressions done.  Came back in sinus rhythm.  Heart rate in 100s.  Systolic 40J to 81X.  Now still blood pressure 140 with heart rate 106.  Surgery, anesthesia, medicine, nursing, chaplain at bedside.  Anesthesia and medicine hospitalist suspect vasovagal etiology.  No definite dysrhythmias.  Wife was nearby in room.  Chaplain, nursing, & myself updating her.  Dr Lucia Gaskins at bedside.  Patient transferred down to the intensive care unit.  On minimal vent settings.  Endotracheal tube suctioning reveals no contamination into the trachea at this point.  They do in and out catheterized last night and not placed a Foley despite my discussion.  Foley had been placed.  Dark yellow urine in bag.  Abdomen mildly distended but soft.  Incisions clean.  No drainage.  No DIC.  No cellulitis.  Not following commands at this moment.  Pupils pinpoint and weakly reactive.    Dr. Melvyn Novas with critical care team (nursing, respiratory therapy, etc. ) at bedside helping evaluate with critical care team.  Creatinine  elevated above 3.  Most likely will more aggressively hydrate to help correct his kidney issues, even if that means he may go into some pulmonary edema.  Chest x-ray EKG.  Rule out myocardial infarction or dysrhythmia.  Watch out for ARDS.  Bowel rest for now.  Follow closely.  Patient Active Problem List   Diagnosis Date Noted  . Hypomagnesemia 03/26/2018  . Stricture of intestine or colon ([place]) 04/03/2018  . CKD (chronic kidney disease), stage II 04/14/2016  . Colon polyp   . Crohn's disease of both small and large intestine with intestinal obstruction ([place]) 06/09/2014  . Hypernatremia   . Hypokalemia   . Leucocytosis 03/16/2014  . History of TIA (transient ischemic attack) 03/01/2013  . Vitamin B12 deficiency 03/23/2008  . 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 HYPERLIPIDEMIA 03/23/2008  . Crohn's disease ([place]) 03/23/2008  . IRON DEFICIENCY ANEMIA, HX OF 03/23/2008  . History of colonic polyps 03/23/2008    Past Medical History:  Diagnosis Date  . Acute renal failure ([place]) 03/16/2014  . Allergy   . Cataract    been removed   . Crohn disease ([place])   . Dehydration 03/01/2013   Diarrhea due to crohn's disease.   . Diverticulosis   . GERD (gastroesophageal reflux disease)   . Glaucoma   . History of adenomatous polyp of colon   . Hyperhomocysteinemia ([place])   . Hyperlipidemia   . PONV (postoperative nausea and vomiting)    "due to lots of mucus and phelgm in throat"-all with colonoscopy and years ago  with gall bladder surgery   . Prostate cancer [facility])    "watchful waiting"- rechecks every 3- 6 months- Dr. Alinda Money  . SBO (small bowel obstruction) ([place]) 03/17/2014  . Small bowel obstruction ([place])   . TIA    mini stroke    . TIA (transient ischemic attack)    HX OF   . Vitamin B12 deficiency     Past Surgical History:  Procedure Laterality Date  . APPENDECTOMY    . BIOPSY  12/16/2017   Procedure: BIOPSY;  Surgeon: Yetta Flock, MD;  Location: [facility];  Service:  Gastroenterology;;  . CATARACT EXTRACTION     both eyes  . CHOLECYSTECTOMY    . COLONOSCOPY    . COLONOSCOPY WITH PROPOFOL N/A 07/24/2015   Procedure: COLONOSCOPY WITH PROPOFOL;  Surgeon: Manus Gunning, MD;  [facility];  Service: Gastroenterology;  Laterality: N/A;  . COLONOSCOPY WITH PROPOFOL N/A 12/12/2015   Procedure: COLONOSCOPY WITH PROPOFOL;  Surgeon: Manus Gunning, MD;  [facility];  Service: Gastroenterology;  Laterality: N/A;  . COLONOSCOPY WITH PROPOFOL N/A 12/16/2017   Procedure: COLONOSCOPY WITH PROPOFOL;  Surgeon: Yetta Flock, MD;  [facility];  Service: Gastroenterology;  Laterality: N/A;  . HOT HEMOSTASIS N/A 12/12/2015   Procedure: HOT HEMOSTASIS (ARGON PLASMA COAGULATION/BICAP);  Surgeon: Manus Gunning, MD;  Location: [facility];  Service: Gastroenterology;  Laterality: N/A;  . POLYPECTOMY    . terminal ileum resection    . TONSILLECTOMY    . VASECTOMY      Social History   Socioeconomic History  . Marital status: Married    Spouse name: Occupational hygienist  . Number of children: 2  . Years of education: college  . Highest education level: Not on file  Occupational History  . Occupation: Retired  Scientific laboratory technician  . Financial resource strain: Not on file  . Food insecurity:    Worry: Not on file    Inability: Not on file  . Transportation needs:    Medical: Not on file    Non-medical: Not on file  Tobacco Use  . Smoking status: Former Smoker    Types: Cigarettes    Last attempt to quit: 07/16/1988    Years since quitting: 29.7  . Smokeless tobacco: Never Used  Substance and Sexual Activity  . Alcohol use: Yes    Alcohol/week: 0.0 standard drinks    Comment: occasionally to rare  . Drug use: No  . Sexual activity: Not Currently  Lifestyle  . Physical activity:    Days per week: Not on file    Minutes per session: Not on file  . Stress: Not on file  Relationships  . Social connections:    Talks on  phone: Not on file    Gets together: Not on file    Attends religious service: Not on file    Active member of club or organization: Not on file    Attends meetings of clubs or organizations: Not on file    Relationship status: Not on file  . Intimate partner violence:    Fear of current or ex partner: Not on file    Emotionally abused: Not on file    Physically abused: Not on file    Forced sexual activity: Not on file  Other Topics Concern  . Not on file  Social History Narrative   Caffeine 2 cups daily. De caff tea    Family History  Problem Relation Age of Onset  . Crohn's disease  Mother   . Heart disease Father   . Stroke Father   . Diabetes Paternal Grandfather   . Colon cancer Neg Hx   . Esophageal cancer Neg Hx   . Rectal cancer Neg Hx   . Stomach cancer Neg Hx   . Colon polyps Neg Hx     Current Facility-Administered Medications  Medication Dose Route Frequency Provider Last Rate Last Dose  . albumin human 5 % solution 12.5 g  12.5 g Intravenous Q6H PRN Michael Boston, MD      . alum & mag hydroxide-simeth (MAALOX/MYLANTA) 200-200-20 MG/5ML suspension 30 mL  30 mL Oral Q6H PRN Michael Boston, MD      . aspirin EC tablet 81 mg  81 mg Oral Daily Michael Boston, MD   81 mg at 03/26/18 0904  . bisacodyl (DULCOLAX) suppository 10 mg  10 mg Rectal Daily Michael Boston, MD      . dextrose 5 % and 0.45 % NaCl with KCl 40 mEq/L infusion   Intravenous Continuous Michael Boston, MD 50 mL/hr at 03/27/18 [id number]    . diphenhydrAMINE (BENADRYL) injection 12.5 mg  12.5 mg Intravenous Q6H PRN Michael Boston, MD      . enoxaparin (LOVENOX) injection 40 mg  40 mg Subcutaneous Q24H Michael Boston, MD   40 mg at 03/26/18 0904  . guaiFENesin (MUCINEX) 12 hr tablet 600 mg  600 mg Oral BID PRN Michael Boston, MD   600 mg at 03/26/18 2230  . hydrALAZINE (APRESOLINE) injection 10 mg  10 mg Intravenous Q2H PRN Michael Boston, MD      . HYDROmorphone (DILAUDID) injection 0.5-2 mg  0.5-2 mg Intravenous Q4H  PRN Michael Boston, MD   2 mg at 03/26/18 1621  . latanoprost (XALATAN) 0.005 % ophthalmic solution 1 drop  1 drop Both Eyes Ardeen Fillers, MD   1 drop at 03/26/18 2139  . metoCLOPramide (REGLAN) injection 10 mg  10 mg Intravenous Q6H PRN Michael Boston, MD      . metoprolol tartrate (LOPRESSOR) injection 5 mg  5 mg Intravenous Q6H PRN Michael Boston, MD      . ondansetron [facility]) injection 4 mg  4 mg Intravenous Q6H PRN Michael Boston, MD      . prochlorperazine (COMPAZINE) injection 5-10 mg  5-10 mg Intravenous Q6H PRN Michael Boston, MD         No Known Allergies  BP 120/82 (BP Location: Right Arm)   Pulse (!) 118   Temp 98 F (36.7 C) (Oral)   Resp 18   Ht 6' (1.829 m)   Wt 75.8 kg   SpO2 94%   BMI 22.65 kg/m   No results found.  Note: This dictation was prepared with Dragon/digital dictation along with [employer]. Any transcriptional errors that result from this process are unintentional.   .Adin Hector, M.D., F.A.C.S. Gastrointestinal and Minimally Invasive Surgery [facility] [address] [place], [place] [address] [phone] Main / Paging  03/27/2018 10:08 AM

## 2018-03-27 NOTE — Procedures (Signed)
Arterial Catheter Insertion Procedure Note Ricardo Villa [id number] 09/11/1934  Procedure: Insertion of Arterial Catheter  Indications: Blood pressure monitoring  Procedure Details Consent: Risks of procedure as well as the alternatives and risks of each were explained to the (patient/caregiver).  Consent for procedure obtained. Time Out: Verified patient identification, verified procedure, site/side was marked, verified correct patient position, special equipment/implants available, medications/allergies/relevent history reviewed, required imaging and test results available.  Performed  Maximum sterile technique was used including antiseptics, cap, gloves, gown, hand hygiene, mask and sheet. Skin prep: Chlorhexidine; local anesthetic administered 20 gauge catheter was inserted into left radial artery using the Seldinger technique. ULTRASOUND GUIDANCE USED: YES Evaluation Blood flow good; BP tracing good. Complications: No apparent complications.   [name], AGACNP-BC [place] Pager [phone] or [phone]  03/27/2018 11:58 AM

## 2018-03-27 NOTE — Progress Notes (Signed)
   03/27/18 1000  Clinical Encounter Type  Visited With Family  Visit Type Initial;Psychological support;Spiritual support;Code;Critical Care  Referral From Nurse  Consult/Referral To Chaplain  Spiritual Encounters  Spiritual Needs Emotional;Other (Comment) (Spiritual Care Conversation/Support)  Stress Factors  Patient Stress Factors Not reviewed  Family Stress Factors Lack of knowledge;Health changes;Major life changes   I provided spiritual support to the patient's wife when the patient coded. The patient's wife was in shock at the time of my visit. She was wondering what had happened. I provided a comforting presence and then walked her down to the icu waiting room once the patient was transferred.  I will follow up as needed.  Please, contact Spiritual Care for further assistance.  Chaplain Shanon Ace M.Div., [facility]

## 2018-03-28 DIAGNOSIS — N189 Chronic kidney disease, unspecified: Secondary | ICD-10-CM

## 2018-03-28 DIAGNOSIS — N179 Acute kidney failure, unspecified: Secondary | ICD-10-CM

## 2018-03-28 DIAGNOSIS — E872 Acidosis, unspecified: Secondary | ICD-10-CM | POA: Diagnosis not present

## 2018-03-28 LAB — CBC
HCT: 35.9 % — ABNORMAL LOW (ref 39.0–52.0)
Hemoglobin: 10.7 g/dL — ABNORMAL LOW (ref 13.0–17.0)
MCH: 29.2 pg (ref 26.0–34.0)
MCHC: 29.8 g/dL — ABNORMAL LOW (ref 30.0–36.0)
MCV: 98.1 fL (ref 80.0–100.0)
Platelets: 206 10*3/uL (ref 150–400)
RBC: 3.66 MIL/uL — ABNORMAL LOW (ref 4.22–5.81)
RDW: 14.3 % (ref 11.5–15.5)
WBC: 18 10*3/uL — ABNORMAL HIGH (ref 4.0–10.5)
nRBC: 0.2 % (ref 0.0–0.2)

## 2018-03-28 LAB — GLUCOSE, CAPILLARY
GLUCOSE-CAPILLARY: 98 mg/dL (ref 70–99)
GLUCOSE-CAPILLARY: 103 mg/dL — AB (ref 70–99)
GLUCOSE-CAPILLARY: 126 mg/dL — AB (ref 70–99)
Glucose-Capillary: 104 mg/dL — ABNORMAL HIGH (ref 70–99)
Glucose-Capillary: 106 mg/dL — ABNORMAL HIGH (ref 70–99)
Glucose-Capillary: 108 mg/dL — ABNORMAL HIGH (ref 70–99)
Glucose-Capillary: 117 mg/dL — ABNORMAL HIGH (ref 70–99)
Glucose-Capillary: 146 mg/dL — ABNORMAL HIGH (ref 70–99)
Glucose-Capillary: 227 mg/dL — ABNORMAL HIGH (ref 70–99)
Glucose-Capillary: 59 mg/dL — ABNORMAL LOW (ref 70–99)
Glucose-Capillary: 69 mg/dL — ABNORMAL LOW (ref 70–99)
Glucose-Capillary: 74 mg/dL (ref 70–99)
Glucose-Capillary: 78 mg/dL (ref 70–99)
Glucose-Capillary: 86 mg/dL (ref 70–99)

## 2018-03-28 LAB — LACTIC ACID, PLASMA
Lactic Acid, Venous: 12.3 mmol/L (ref 0.5–1.9)
Lactic Acid, Venous: 14.6 mmol/L (ref 0.5–1.9)

## 2018-03-28 LAB — BLOOD GAS, ARTERIAL
Acid-base deficit: 21 mmol/L — ABNORMAL HIGH (ref 0.0–2.0)
Acid-base deficit: 15.3 mmol/L — ABNORMAL HIGH (ref 0.0–2.0)
Bicarbonate: 7.1 mmol/L — ABNORMAL LOW (ref 20.0–28.0)
Bicarbonate: 9.9 mmol/L — ABNORMAL LOW (ref 20.0–28.0)
Drawn by: 308601
Drawn by: 103701
FIO2: 60
FIO2: 60
MECHVT: 620 mL
MECHVT: 620 mL
O2 Saturation: 93.5 %
O2 Saturation: 94.7 %
PATIENT TEMPERATURE: 37
PCO2 ART: 22.8 mmHg — AB (ref 32.0–48.0)
PEEP: 5 cmH2O
PEEP: 5 cmH2O
Patient temperature: 37
RATE: 15 resp/min
RATE: 30 resp/min
pCO2 arterial: 22.2 mmHg — ABNORMAL LOW (ref 32.0–48.0)
pH, Arterial: 7.118 — CL (ref 7.350–7.450)
pH, Arterial: 7.273 — ABNORMAL LOW (ref 7.350–7.450)
pO2, Arterial: 91.6 mmHg (ref 83.0–108.0)
pO2, Arterial: 88.9 mmHg (ref 83.0–108.0)

## 2018-03-28 LAB — TROPONIN I
TROPONIN I: 0.13 ng/mL — AB (ref <0.03)
Troponin I: 0.23 ng/mL (ref <0.03)
Troponin I: 0.3 ng/mL (ref <0.03)

## 2018-03-28 LAB — BASIC METABOLIC PANEL
Anion gap: 18 — ABNORMAL HIGH (ref 5–15)
BUN: 35 mg/dL — ABNORMAL HIGH (ref 8–23)
CO2: 11 mmol/L — AB (ref 22–32)
Calcium: 6.7 mg/dL — ABNORMAL LOW (ref 8.9–10.3)
Chloride: 115 mmol/L — ABNORMAL HIGH (ref 98–111)
Creatinine, Ser: 4.2 mg/dL — ABNORMAL HIGH (ref 0.61–1.24)
GFR calc Af Amer: 14 mL/min — ABNORMAL LOW (ref >60)
GFR calc non Af Amer: 12 mL/min — ABNORMAL LOW (ref >60)
GLUCOSE: 117 mg/dL — AB (ref 70–99)
Potassium: 4.8 mmol/L (ref 3.5–5.1)
Sodium: 144 mmol/L (ref 135–145)

## 2018-03-28 MED ORDER — DEXTROSE 50 % IV SOLN
INTRAVENOUS | Status: AC
  Filled 2018-03-28: qty 50

## 2018-03-28 MED ORDER — SODIUM BICARBONATE 8.4 % IV SOLN
200.0000 meq | Freq: Once | INTRAVENOUS | Status: AC
  Administered 2018-03-28: 200 meq via INTRAVENOUS
  Filled 2018-03-28: qty 50

## 2018-03-28 MED ORDER — PIPERACILLIN-TAZOBACTAM 3.375 G IVPB 30 MIN
3.3750 g | Freq: Once | INTRAVENOUS | Status: AC
  Administered 2018-03-28: 3.375 g via INTRAVENOUS
  Filled 2018-03-28 (×2): qty 50

## 2018-03-28 MED ORDER — DEXTROSE 10 % IV SOLN
INTRAVENOUS | Status: DC
  Administered 2018-03-28 (×2): via INTRAVENOUS

## 2018-03-28 MED ORDER — SODIUM CHLORIDE 0.9 % IV BOLUS
500.0000 mL | Freq: Once | INTRAVENOUS | Status: AC
  Administered 2018-03-28: 500 mL via INTRAVENOUS

## 2018-03-28 MED ORDER — DEXTROSE 50 % IV SOLN
50.0000 mL | Freq: Once | INTRAVENOUS | Status: AC
  Administered 2018-03-28: 50 mL via INTRAVENOUS

## 2018-03-28 MED ORDER — DEXTROSE 50 % IV SOLN
1.0000 | Freq: Once | INTRAVENOUS | Status: AC
  Administered 2018-03-28: 50 mL via INTRAVENOUS

## 2018-03-28 MED ORDER — MIDAZOLAM 50MG/50ML (1MG/ML) PREMIX INFUSION
1.0000 mg/h | INTRAVENOUS | Status: DC
  Administered 2018-03-28 (×2): 1 mg/h via INTRAVENOUS
  Filled 2018-03-28 (×2): qty 50

## 2018-03-28 MED ORDER — PIPERACILLIN-TAZOBACTAM IN DEX 2-0.25 GM/50ML IV SOLN
2.2500 g | Freq: Four times a day (QID) | INTRAVENOUS | Status: DC
  Administered 2018-03-28 – 2018-03-29 (×3): 2.25 g via INTRAVENOUS
  Filled 2018-03-28 (×4): qty 50

## 2018-03-28 MED ORDER — SODIUM BICARBONATE 8.4 % IV SOLN
INTRAVENOUS | Status: DC
  Administered 2018-03-28 – 2018-03-29 (×2): via INTRAVENOUS
  Filled 2018-03-28 (×3): qty 150

## 2018-03-28 MED ORDER — DEXTROSE 50 % IV SOLN
25.0000 mL | Freq: Once | INTRAVENOUS | Status: AC
  Administered 2018-03-28: 25 mL via INTRAVENOUS

## 2018-03-28 NOTE — Progress Notes (Signed)
CRITICAL VALUE ALERT  Critical Value:  LA 12.3 0655 14.6 0955  Date & Time Notied:  03/28/18 [id number] [id number]  Provider Notified: Dr. Melvyn Novas  Orders Received/Actions taken: No new orders at this time CCM is aware and is doing everything we can for this pt. Currently. RN will continue to monitor closely.

## 2018-03-28 NOTE — Progress Notes (Signed)
Family minister was present and having prayer when I arrived. Pt's wife, daughters, and sisters were bedside. They enjoyed talking about how pt loved to tell jokes, laugh and have fun. They talked about their faith and church affiliations. His wife spoke of her faith and yet how difficult it is now. We discussed how we love here and then have to let go. Family is at peace, but clearly sad, at times tearful, regarding their impending loss. Please page if additional support is needed at any time. Rio Dell, MDiv   03/28/18 1400  Clinical Encounter Type  Visited With Family

## 2018-03-28 NOTE — Progress Notes (Signed)
eLink Physician-Brief Progress Note Patient Name: Ricardo Villa DOB: Oct 22, 1934 MRN: [id number]   Date of Service  03/28/2018  HPI/Events of Note  Patient with ongoing metabolic acidosis with pH 7.02/05/90/7.  Had received 1 amp of bicarb earlier and has been consistently breathing over the vent to a rate of 30  eICU Interventions  Plan: Increase RR to 30 to match patient's ongoing resp drive 4 amps of bicarb IVP Bicarb gtt at 100cc/hr Continue with D10 ABG in 3 hours     Intervention Category Major Interventions: Acid-Base disturbance - evaluation and management  Jt Brabec 03/28/2018, 6:52 AM

## 2018-03-28 NOTE — Progress Notes (Signed)
Hypoglycemic Event  CBG: 69  Treatment: 85m of D50  Symptoms: hypotension and diaphoretic   Follow-up CBG: Time:0543 CBG Result:98  Possible Reasons for Event: pt NPO  Comments/MD notified: Dr. DJimmy Footmannotified. Received orders to start D10 at 362mhr and to give a 500cc NS bolus    HaCyril Loosen

## 2018-03-28 NOTE — Consult Note (Signed)
NAME:  Ricardo Villa, MRN:  [id number], DOB:  Aug 07, 1934, LOS: 3 ADMISSION DATE:  04/10/2018, CONSULTATION DATE:  1/17 REFERRING MD:  Dr. Johney Maine, CHIEF COMPLAINT:  Cardiac arrest   Brief History   63 yowm with chrohn's dz s/p complex abd surgery 1/15 with arrest am 1/7 p ? Asp    History of present illness   84 year old male with past medical history as below, which is significant for bowel obstruction with ileal stricture.  In the past this is resolved without surgery, but he did require admission for bowel rest and IV fluids.  During recent colonoscopy was found to have significant stricture and a large polyp in the ascending colon.  Seeing his this is been the site of a prior polyp more definitive intervention was requested and he presented on 1/15 for robot assisted ileocolectomy, ileal enteroplasty, and lysis of adhesions.  He was in the ER for 4 hours under Dr. Johney Maine. In the immediate post operative period he was progressing as expected. He had been started on a diet and had been ambulating. In the AM hours of 1/17 after using the commode and standing up, he suffered a syncopal event, which resulted in loss of pulse. CPR/ACLS was initiated and persisted for only a couple of minutes. He was intubated for airway protection and transferred to [facility] ICU for further monitoring.       Significant Hospital Events   1/15 admit for elective bowel surgery 1/17 brief cardiac arrest after bowel movement. Intubated, Tx to ICU.     Procedures:  Ileocolectomy (Gross) 1/15  ETT 1/17>>> LIJ CVL 1/17 > L rad art 1/17>  Significant Diagnostic Tests:  10/8 Colonoscopy: Stricture in the terminal ileum, not able to be traversed, fibrotic. Mucosa near the surgical anastomosis as outlined - unclear if normal variant versus flat polypoid lesion. Biopsied. Post-polypectomy scar at the hepatic flexure. Colonic spasm  Micro Data:  BC x 2  1/18 UC   1/18  Antimicrobials:  Cefotetan 1/15     Clindamycin 1/15 Gentamicin 1/15  Zosyn 1/18 >>>   Interim history/subjective:  No better over night - cvp falling despite +++ I/0, lactate rising, blood sugar falling off insulin   Objective   Blood pressure (!) 82/54, pulse (!) 122, temperature 98.2 F (36.8 C), temperature source Axillary, resp. rate (!) 34, height 6' (1.829 m), weight 84 kg, SpO2 99 %. CVP:  [4 mmHg-13 mmHg] 5 mmHg  Vent Mode: PRVC FiO2 (%):  [60 %-100 %] 60 % Set Rate:  [15 bmp-30 bmp] 30 bmp Vt Set:  [620 mL] 620 mL PEEP:  [5 cmH20] 5 cmH20 Plateau Pressure:  [18 cmH20-24 cmH20] 24 cmH20   Intake/Output Summary (Last 24 hours) at 03/28/2018 1105 Last data filed at 03/28/2018 0932 Gross per 24 hour  Intake 3770.07 ml  Output 197 ml  Net 3573.07 ml   Filed Weights   03/21/2018 0959 03/28/18 0406  Weight: 75.8 kg 84 kg    CVP:  [4 mmHg-13 mmHg] 5 mmHg    Examination:  Elderly wm orally intubated  Pt blank stare not f/c  No jvd Oropharynx oral et  Neck supple Lungs with distant bs  RRR distant s1s2 Abd mod distended / decreased bs  Extr relatively warm, no edema     I personally reviewed images and agree with radiology impression as follows:  CXR:   11:27pm 1/17 Unchanged support apparatus, right pleural effusion and bibasilar atelectasis.     Resolved Hospital Problem list  Assessment & Plan:   Vasovagal vs Cardiac Arrest  -brief LOC while on toilet, brief CPR with ROSC P: No evidence aspiration or MI but evolving septic shock picture   Shock - cortisol level 79 so no need for steroids - progressive lactic acidosis/3rd spacing into abd  c/w mesenteric ischemia but no  - no evidence infection but hard to r/o so rec bc x 2, uc and start zosyn      Acute Hypoxemic Respiratory Failure  P: PRVC 8 cc/kg  Wean PEEP / FiO2 for sats >01%    AG/NG Metabolic Acidosis  AKI / oliguric now Hypokalemia  P: Bicarb gtt   Follow ABG  Trend BMP / urinary output   Avoid  nephrotoxic agents, ensure adequate renal perfusion ? Needs angiogensin II but don't think it will make any difference as to outcome here   S/P Ileocolectomy for Recurrent Polyps Chron's Disease   P: Post operative care per CCS     Acute Metabolic Encephalopathy P: Minimize sedation as able  PRN fentanyl / versed while on vent  Frequent neuro exams      At Risk Malnutrition  P:  PPI  TPN would be needed if he responds to efforts to stablize him over the next 48 h and if not will make no short term difference   hypoglycemia  P:  titrating up d10 w very poor prognostic sign indicating failure of gluconeogenesis but the liver in this setting.   Best practice:  Diet: NPO Pain/Anxiety/Delirium protocol (if indicated): NA VAP protocol (if indicated): per protocol DVT prophylaxis: SCDs post op GI prophylaxis: PPI Glucose control: monitor on labs Mobility: BR Code Status: FULL Family Communication: Wife and daughter updated at bedside/ very poor prognosis here  Disposition: ICU  Labs   CBC: Recent Labs  Lab 03/26/18 0434 03/27/18 0749 03/27/18 1038 03/28/18 0239  WBC 13.3* 16.0* 13.2* 18.0*  HGB 10.9* 12.7* 9.8* 10.7*  HCT 34.6* 43.1 33.7* 35.9*  MCV 92.0 98.4 101.2* 98.1  PLT 226 286 217 751    Basic Metabolic Panel: Recent Labs  Lab 03/26/18 0434 03/27/18 0749 03/27/18 1038 03/27/18 1625 03/28/18 0239  NA 144  --  145 141 144  K 3.8 3.6 3.3* 4.1 4.8  CL 110  --  115* 111 115*  CO2 25  --  12* 19* 11*  GLUCOSE 125*  --  154* 94 117*  BUN 10  --  23 31* 35*  CREATININE 1.67* 3.31* 2.98* 3.39* 4.20*  CALCIUM 7.9*  --  7.2* 7.2* 6.7*  MG 1.6*  --   --   --   --    GFR: Estimated Creatinine Clearance: 15.1 mL/min (A) (by C-G formula based on SCr of 4.2 mg/dL (H)). Recent Labs  Lab 03/26/18 0434 03/27/18 0749 03/27/18 1038 03/27/18 1247 03/27/18 1700 03/28/18 0239 03/28/18 0655  WBC 13.3* 16.0* 13.2*  --   --  18.0*  --   LATICACIDVEN  --   --    --  8.0* 5.6*  --  12.3*    Liver Function Tests: Recent Labs  Lab 03/27/18 1038 03/27/18 1625  AST 35  35 79*  ALT 13  14 23   ALKPHOS 34*  35* 24*  BILITOT 1.0  0.9 1.3*  PROT 3.5*  3.4* 3.8*  ALBUMIN 1.6*  1.6* 1.9*   Recent Labs  Lab 03/27/18 1038  LIPASE 107*   No results for input(s): AMMONIA in the last 168 hours.  ABG  Component Value Date/Time   PHART 7.273 (L) 03/28/2018 1030   PCO2ART 22.2 (L) 03/28/2018 1030   PO2ART 88.9 03/28/2018 1030   HCO3 9.9 (L) 03/28/2018 1030   ACIDBASEDEF 15.3 (H) 03/28/2018 1030   O2SAT 94.7 03/28/2018 1030     Coagulation Profile: No results for input(s): INR, PROTIME in the last 168 hours.  Cardiac Enzymes: Recent Labs  Lab 03/27/18 1038 03/27/18 1625 03/27/18 2224 03/28/18 0658  TROPONINI 0.04* 0.08* 0.10* 0.13*    HbA1C: Hgb A1c MFr Bld  Date/Time Value Ref Range Status  03/19/2018 09:08 AM 5.2 4.8 - 5.6 % Final    Comment:    (NOTE) Pre diabetes:          5.7%-6.4% Diabetes:              >6.4% Glycemic control for   <7.0% adults with diabetes     CBG: Recent Labs  Lab 03/28/18 0507 03/28/18 0543 03/28/18 0728 03/28/18 0910 03/28/18 1041  GLUCAP 69* 98 86 74 104*    Review of Systems:   Patient is encephalopathic and/or intubated. Therefore history has been obtained from chart review.   Past Medical History  He,  has a past medical history of Acute renal failure ([place]) (03/16/2014), Allergy, Cataract, Crohn disease ([place]), Dehydration (03/01/2013), Diverticulosis, GERD (gastroesophageal reflux disease), Glaucoma, History of adenomatous polyp of colon, Hyperhomocysteinemia ([place]), Hyperlipidemia, PONV (postoperative nausea and vomiting), Prostate cancer ([place]), SBO (small bowel obstruction) ([place]) (03/17/2014), Small bowel obstruction ([place]), TIA, TIA (transient ischemic attack), and Vitamin B12 deficiency.   Surgical History    Past Surgical History:  Procedure Laterality Date  . APPENDECTOMY     . BIOPSY  12/16/2017   Procedure: BIOPSY;  Surgeon: Yetta Flock, MD;  [facility]: [facility];  Service: Gastroenterology;;  . CATARACT EXTRACTION     both eyes  . CHOLECYSTECTOMY    . COLONOSCOPY    . COLONOSCOPY WITH PROPOFOL N/A 07/24/2015   Procedure: COLONOSCOPY WITH PROPOFOL;  Surgeon: Manus Gunning, MD;  [facility]: [facility];  Service: Gastroenterology;  Laterality: N/A;  . COLONOSCOPY WITH PROPOFOL N/A 12/12/2015   Procedure: COLONOSCOPY WITH PROPOFOL;  Surgeon: Manus Gunning, MD;  [facility]: [facility];  Service: Gastroenterology;  Laterality: N/A;  . COLONOSCOPY WITH PROPOFOL N/A 12/16/2017   Procedure: COLONOSCOPY WITH PROPOFOL;  Surgeon: Yetta Flock, MD;  Location: [facility];  Service: Gastroenterology;  Laterality: N/A;  . HOT HEMOSTASIS N/A 12/12/2015   Procedure: HOT HEMOSTASIS (ARGON PLASMA COAGULATION/BICAP);  Surgeon: Manus Gunning, MD;  Location: [facility];  Service: Gastroenterology;  Laterality: N/A;  . POLYPECTOMY    . terminal ileum resection    . TONSILLECTOMY    . VASECTOMY       Social History   reports that he quit smoking about 29 years ago. His smoking use included cigarettes. He has never used smokeless tobacco. He reports current alcohol use. He reports that he does not use drugs.   Family History   His family history includes Crohn's disease in his mother; Diabetes in his paternal grandfather; Heart disease in his father; Stroke in his father. There is no history of Colon cancer, Esophageal cancer, Rectal cancer, Stomach cancer, or Colon polyps.   Allergies No Known Allergies     The patient is critically ill with multiple organ systems failure and requires high complexity decision making for assessment and support, frequent evaluation and titration of therapies, application of advanced monitoring technologies and extensive interpretation of multiple databases. Critical Care  Time devoted to patient care  services described in this note is 45 minutes.    Wife/ daughter updated at bedside    Christinia Gully, MD Pulmonary and [place] [phone] After 5:30 PM or weekends, use Beeper [phone]

## 2018-03-28 NOTE — Progress Notes (Signed)
Patient ID: Ricardo Villa, male   DOB: Apr 25, 1934, 84 y.o.   MRN: [id number]  [facility]  Patient seen and examined.  Discussed with Dr. Christinia Gully.  I contacted Dr. Neysa Bonito and discussed.  Patient seen earlier today by Dr. Romana Juniper.  Patient is in multiorgan system failure.  He is requiring maximal supportive measures.  This is being managed by the critical care team.  Family is at bedside including the patient's wife.  We discussed possible causes of multiorgan system failure related to the patient's recent surgery.  At this point, the patient is unlikely to survive.  We discussed the option of returning the patient to the operating room for exploration.  This would be high risk.  At this time, the family does not desire further operative intervention.  With the nursing staff in the room, we also discussed [place] for this patient.  The family does not wish for him to undergo cardiopulmonary resuscitation.  They will address this with the nursing staff and put the appropriate documentation in the patient's record.  General surgery will continue to follow and monitor the patient closely.  At this time, no further operative intervention is anticipated.  Ricardo Villa, [facility] Office: [phone]

## 2018-03-28 NOTE — Progress Notes (Signed)
Hypoglycemic Event  CBG: < 10  Treatment: 2 amps of D50  Symptoms: pt diaphoretic, and not responding, BP dropped   Follow-up CBG: Time: 2359 CBG Result:227  Possible Reasons for Event: Pt on ventilator and has been NPO since early morning 1/17, with no orders for CBG checks.  Comments/MD notified: RN noticed pt didn't look or seem right so RN checked BS, Dr. Jimmy Footman notified and put the orders in for the 2 amps of [facility]

## 2018-03-28 NOTE — Progress Notes (Signed)
Pharmacy Antibiotic Note  Ricardo Villa is a 84 y.o. male admitted on 04/01/2018 for ileocolectomy, ileal enteroplasty, LOA .  Pharmacy has been consulted for zosyn dosing.  Plan: Zosyn 3.375 gm IV x 1 dose over 30 minutes followed by zosyn 2.25 gm IV q6h  Height: 6' (182.9 cm) Weight: 185 lb 3 oz (84 kg) IBW/kg (Calculated) : 77.6  Temp (24hrs), Avg:98 F (36.7 C), Min:97.3 F (36.3 C), Max:98.3 F (36.8 C)  Recent Labs  Lab 03/26/18 0434 03/27/18 0749 03/27/18 1038 03/27/18 1247 03/27/18 1625 03/27/18 1700 03/28/18 0239 03/28/18 0655 03/28/18 0955  WBC 13.3* 16.0* 13.2*  --   --   --  18.0*  --   --   CREATININE 1.67* 3.31* 2.98*  --  3.39*  --  4.20*  --   --   LATICACIDVEN  --   --   --  8.0*  --  5.6*  --  12.3* 14.6*    Estimated Creatinine Clearance: 15.1 mL/min (A) (by C-G formula based on SCr of 4.2 mg/dL (H)).    No Known Allergies  Antimicrobials this admission: 1/14 Cefotetan>>1/15- sx px 1/15 gent/clinda irrigation x 1  1/18 zosyn >> Dose adjustments this admission:  Microbiology results: 1/18 BCx2>> 1/18 Ucx>>  Thank you for allowing pharmacy to be a part of this patient's care.  Eudelia Bunch, Pharm.D [phone] 03/28/2018 12:20 PM

## 2018-03-28 NOTE — Progress Notes (Signed)
Pt respirations began to increase to 35/min around 2200. RN gave both doses of prn 1 mg versed and 50 mcg fentanyl, with no change. RN called Dr. Jimmy Footman, and she put in one time dose for 50 mcg of fentanyl. And updated the range for the fentanyl. After giving the one time dose there was still no change. RN called D[facility] again and she upped the versed range. RN gave an additional 1 mg of versed. Will continue to monitor.

## 2018-03-28 NOTE — Progress Notes (Signed)
3 Days Post-Op   Subjective/Chief Complaint: Had cardiac arrest yesterday, intubated (with emesis at the time of intubation) and moved to unit. Continues to have loose bowel movements. Remains tachycardic, hypotensive requiring levo @ 38 and vaso @ 0.03. Low blood sugars overnight. On PRVC, FiO2 60% Peep 5. Acidotic- bocarb 11 (was on bicarb gtt now off, worsening AKI with creatinine now 4.2, almost anuric overnight despite 5L of crystalloid. WBC up as well to 18. Troponins were slowly uptrending last night.   He is on No sedation this morning. Able to answer some y/n questions but not terribly interactive.   Had 3 loose bowel movements yesterday. Only one overnight. Minimal OG output.   Objective: Vital signs in last 24 hours: Temp:  [97.3 F (36.3 C)-98.3 F (36.8 C)] 98 F (36.7 C) (01/18 0405) Pulse Rate:  [102-125] 121 (01/18 0438) Resp:  [21-35] 33 (01/18 0438) BP: (63-159)/(42-90) 82/54 (01/18 0438) SpO2:  [89 %-100 %] 97 % (01/18 0438) Arterial Line BP: (48-164)/(30-75) 95/59 (01/18 0330) FiO2 (%):  [60 %-100 %] 60 % (01/18 0438) Weight:  [84 kg] 84 kg (01/18 0406) Last BM Date: 03/27/17  Intake/Output from previous day: 01/17 0701 - 01/18 0700 In: 2407.3 [P.O.:120; I.V.:2066.1; IV Piggyback:221.2] Out: 152 [Urine:75; Emesis/NG output:75; Stool:2] Intake/Output this shift: Total I/O In: 1322.5 [I.V.:1289; IV Piggyback:33.5] Out: 150 [Urine:75; Emesis/NG output:75]  General appearance: alert and fatigued Resp: clear to auscultation bilaterally Cardio: tachycardic, regular GI: soft, mildly distended. OR dressings are dry, some old blood staining of honeycomb  Lab Results:  Recent Labs    03/27/18 1038 03/28/18 0239  WBC 13.2* 18.0*  HGB 9.8* 10.7*  HCT 33.7* 35.9*  PLT 217 206   BMET Recent Labs    03/27/18 1625 03/28/18 0239  NA 141 144  K 4.1 4.8  CL 111 115*  CO2 19* 11*  GLUCOSE 94 117*  BUN 31* 35*  CREATININE 3.39* 4.20*  CALCIUM 7.2* 6.7*    PT/INR No results for input(s): LABPROT, INR in the last 72 hours. ABG Recent Labs    03/27/18 1042 03/27/18 1525  PHART 7.046* 7.362  HCO3 10.8* 16.7*    Studies/Results: Dg Chest Port 1 View  Result Date: 03/28/2018 CLINICAL DATA:  Respiratory distress EXAM: PORTABLE CHEST 1 VIEW COMPARISON:  03/27/2018 FINDINGS: Support Apparatus: --Endotracheal tube: Tip just below the level of the clavicular heads. 3.8 cm above the inferior margin of the carina. --Enteric tube:Tip and sideport are below the field of view. --Catheter(s):Left internal jugular vein approach central venous catheter tip is at the cavoatrial junction. --Other: None Right pleural effusion and associated atelectasis. Mild left basilar atelectasis. IMPRESSION: Unchanged support apparatus, right pleural effusion and bibasilar atelectasis. Electronically Signed   By: Ulyses Jarred M.D.   On: 03/28/2018 00:03   Dg Chest Port 1 View  Result Date: 03/27/2018 CLINICAL DATA:  Intubation EXAM: PORTABLE CHEST 1 VIEW COMPARISON:  Portable exam 1150 hours compared to 1006 hours FINDINGS: Tip of endotracheal tube projects 4.1 cm above carina. LEFT jugular central venous catheter tip projects over SVC near cavoatrial junction, new since prior exam. Nasogastric tube extends into stomach. External pacing leads project over chest. Normal heart size, mediastinal contours, and pulmonary vascularity. Bibasilar atelectasis and questionable small RIGHT pleural effusion. No pneumothorax. Bones demineralized. Chest wall emphysema lateral LEFT chest extending into LEFT axilla and BILATERAL supraclavicular regions. IMPRESSION: Bibasilar atelectasis and questionable RIGHT pleural effusion. LEFT chest wall emphysema of uncertain etiology, extending into LEFT axilla and supraclavicular regions.  Electronically Signed   By: Lavonia Dana M.D.   On: 03/27/2018 12:26   Dg Chest Port 1 View  Result Date: 03/27/2018 CLINICAL DATA:  Post intubation. EXAM: PORTABLE  CHEST 1 VIEW COMPARISON:  Radiographs 07/29/2013. FINDINGS: 1006 hours. Endotracheal tube is in the mid trachea, tip 4.0 cm above the carina. Enteric tube projects below the diaphragm, tip not visualized. The heart size and mediastinal contours are stable. There are lower lung volumes with new right greater than left perihilar and lower lobe airspace opacities. No pneumothorax or large pleural effusion identified. Multiple telemetry leads overlie the chest. There appears to be some soft tissue emphysema within the left lateral chest wall and neck bilaterally. This is presumably related to recent abdominal surgery. IMPRESSION: 1. Satisfactory position of the endotracheal and enteric tubes. 2. Bibasilar atelectasis or infiltrates. 3. Soft tissue emphysema without evidence of pneumothorax. Electronically Signed   By: Richardean Sale M.D.   On: 03/27/2018 10:54   Dg Abd Portable 1v  Result Date: 03/27/2018 CLINICAL DATA:  Vomiting. EXAM: PORTABLE ABDOMEN - 1 VIEW COMPARISON:  Radiographs of February 09, 2015. FINDINGS: The bowel gas pattern is normal. Status post cholecystectomy. No radio-opaque calculi are seen. IMPRESSION: No evidence of bowel obstruction or ileus. Subcutaneous emphysema is noted bilaterally. Electronically Signed   By: Marijo Conception, M.D.   On: 03/27/2018 12:27    Anti-infectives: Anti-infectives (From admission, onward)   Start     Dose/Rate Route Frequency Ordered Stop   03/26/18 0800  cefoTEtan (CEFOTAN) 2 g in sodium chloride 0.9 % 100 mL IVPB     2 g 200 mL/hr over 30 Minutes Intravenous Every 12 hours 03/26/18 0258 03/26/18 1127   04/01/2018 1902  clindamycin (CLEOCIN) 900 mg, gentamicin (GARAMYCIN) 240 mg in sodium chloride 0.9 % 1,000 mL for intraperitoneal lavage  Status:  Discontinued       As needed 03/20/2018 1903 03/11/2018 1938   03/24/2018 1400  neomycin (MYCIFRADIN) tablet 1,000 mg  Status:  Discontinued     1,000 mg Oral 3 times per day 04/04/2018 0953 03/14/2018 0954    04/04/2018 1400  metroNIDAZOLE (FLAGYL) tablet 1,000 mg  Status:  Discontinued     1,000 mg Oral 3 times per day 03/27/2018 0953 03/18/2018 0954   04/07/2018 1000  cefoTEtan (CEFOTAN) 2 g in sodium chloride 0.9 % 100 mL IVPB     2 g 200 mL/hr over 30 Minutes Intravenous On call to O.R. 03/28/2018 0953 03/18/2018 1919   04/10/2018 0600  clindamycin (CLEOCIN) 900 mg, gentamicin (GARAMYCIN) 240 mg in sodium chloride 0.9 % 1,000 mL for intraperitoneal lavage  Status:  Discontinued      Intraperitoneal To Surgery 03/24/18 0802 04/10/2018 2106      Assessment/Plan: s/p Procedure(s) with comments: XI ROBOT ASSISTED PROXIMAL COLECTOMY WITH LYSIS OF ADHESIONS (N/A) - ERAS PATHWAY C/b cardiac arrest POD 2 with persistent shock Remains critically ill; abdomen seems fairly benign but if no improvement in next 48h would recommend CT Does not look like he was on steroids preop Acute kidney injury on chronic kidney disease stage II Hx Crohns, TIA, hyperhomocysteinemia. Anticoagulation is fine from surgical standpoint if recommended by medical / critical care service   Appreciate thoughtful care from PCCM    LOS: 3 days    Ricardo Villa 03/28/2018

## 2018-03-29 DIAGNOSIS — E872 Acidosis: Secondary | ICD-10-CM

## 2018-03-29 DIAGNOSIS — N183 Chronic kidney disease, stage 3 (moderate): Secondary | ICD-10-CM

## 2018-03-29 DIAGNOSIS — N170 Acute kidney failure with tubular necrosis: Secondary | ICD-10-CM

## 2018-03-29 LAB — BASIC METABOLIC PANEL
BUN: 40 mg/dL — ABNORMAL HIGH (ref 8–23)
CO2: <7 mmol/L — ABNORMAL LOW (ref 22–32)
Calcium: 5.5 mg/dL — CL (ref 8.9–10.3)
Chloride: 111 mmol/L (ref 98–111)
Creatinine, Ser: 5.64 mg/dL — ABNORMAL HIGH (ref 0.61–1.24)
GFR calc Af Amer: 10 mL/min — ABNORMAL LOW (ref >60)
GFR calc non Af Amer: 9 mL/min — ABNORMAL LOW (ref >60)
GLUCOSE: 131 mg/dL — AB (ref 70–99)
Potassium: 5.5 mmol/L — ABNORMAL HIGH (ref 3.5–5.1)
Sodium: 144 mmol/L (ref 135–145)

## 2018-03-29 LAB — CBC
HCT: 31 % — ABNORMAL LOW (ref 39.0–52.0)
Hemoglobin: 8.8 g/dL — ABNORMAL LOW (ref 13.0–17.0)
MCH: 29.6 pg (ref 26.0–34.0)
MCHC: 28.4 g/dL — ABNORMAL LOW (ref 30.0–36.0)
MCV: 104.4 fL — ABNORMAL HIGH (ref 80.0–100.0)
Platelets: 132 10*3/uL — ABNORMAL LOW (ref 150–400)
RBC: 2.97 MIL/uL — AB (ref 4.22–5.81)
RDW: 15.2 % (ref 11.5–15.5)
WBC: 25.4 10*3/uL — ABNORMAL HIGH (ref 4.0–10.5)
nRBC: 6.6 % — ABNORMAL HIGH (ref 0.0–0.2)

## 2018-03-29 LAB — GLUCOSE, CAPILLARY: Glucose-Capillary: 114 mg/dL — ABNORMAL HIGH (ref 70–99)

## 2018-03-29 MED ORDER — SODIUM CHLORIDE 0.9% FLUSH: 10.0000 mL | INTRAVENOUS | Status: DC | PRN

## 2018-03-29 MED ORDER — SODIUM CHLORIDE 0.9 % IV SOLN
2.0000 g | Freq: Once | INTRAVENOUS | Status: AC
  Administered 2018-03-29: 2 g via INTRAVENOUS
  Filled 2018-03-29: qty 20

## 2018-03-29 MED ORDER — NOREPINEPHRINE BITARTRATE 1 MG/ML IV SOLN
0.0000 ug/min | INTRAVENOUS | Status: DC
  Filled 2018-03-29 (×2): qty 4

## 2018-03-29 MED ORDER — MORPHINE 100MG IN NS 100ML (1MG/ML) PREMIX INFUSION: 1.0000 mg/h | INTRAVENOUS | Status: DC

## 2018-03-29 MED ORDER — MORPHINE 100MG IN NS 100ML (1MG/ML) PREMIX INFUSION
1.0000 mg/h | INTRAVENOUS | Status: DC
  Administered 2018-03-29: 1 mg/h via INTRAVENOUS
  Filled 2018-03-29: qty 100

## 2018-03-29 MED ORDER — CHLORHEXIDINE GLUCONATE CLOTH 2 % EX PADS: 6.0000 | MEDICATED_PAD | Freq: Every day | CUTANEOUS | Status: DC

## 2018-03-29 MED ORDER — SODIUM CHLORIDE 0.9% FLUSH: 10.0000 mL | Freq: Two times a day (BID) | INTRAVENOUS | Status: DC

## 2018-03-29 MED ORDER — NOREPINEPHRINE 16 MG/250ML-% IV SOLN
0.0000 ug/min | INTRAVENOUS | Status: DC
  Administered 2018-03-29: 40 ug/min via INTRAVENOUS
  Filled 2018-03-29: qty 250

## 2018-03-29 MED ORDER — MORPHINE BOLUS VIA INFUSION
2.0000 mg | INTRAVENOUS | Status: DC | PRN
  Filled 2018-03-29: qty 2

## 2018-03-30 LAB — URINE CULTURE: Culture: NO GROWTH

## 2018-03-31 ENCOUNTER — Telehealth: Payer: Self-pay

## 2018-03-31 NOTE — Telephone Encounter (Signed)
On  03/31/2018 I received a dc from [employer] (original). DC is for cremation. Patient is a patient of Human resources officer. Courier to take dc to Pulmonary unit for signature.  On 04/01/2018 I received the dc back from Doctor Wert. I got the dc ready and called the funeral home for pickup. I also faxed a copy to funeral home. [phone] mc

## 2018-04-02 LAB — CULTURE, BLOOD (ROUTINE X 2)
Culture: NO GROWTH
Culture: NO GROWTH
Special Requests: ADEQUATE
Special Requests: ADEQUATE

## 2018-04-11 NOTE — Progress Notes (Signed)
S:edated on vent   O: Blood pressure (!) 71/50, pulse (!) 108, temperature 98.6 F (37 C), temperature source Axillary, resp. rate (!) 30, height 6' (1.829 m), weight 90.9 kg, SpO2 96 %. Vent Mode: PRVC FiO2 (%):  [40 %-60 %] 40 % Set Rate:  [30 bmp] 30 bmp Vt Set:  [818 mL] 620 mL PEEP:  [5 cmH20] 5 cmH20 Plateau Pressure:  [20 cmH20-25 cmH20] 23 cmH20  Intake/Output from previous day:  Intake/Output Summary (Last 24 hours) at 04-08-2018 0901 Last data filed at Apr 08, 2018 0800 Gross per 24 hour  Intake 7187.17 ml  Output 10 ml  Net 7177.17 ml    On my exam:  Sedated/ fm at bedside holding vigil Lung with distant bs bilaterally / RRR / bp in 70's on multiple drips/ abd mod distended with absent bs / ext warm trace edema   Interim Data: Lab Results  Component Value Date   WBC 25.4 (H) Apr 08, 2018   HGB 8.8 (L) 04/08/2018   HCT 31.0 (L) April 08, 2018   MCV 104.4 (H) 04-08-18   PLT 132 (L) 2018-04-08   Lab Results  Component Value Date   NA 144 04/08/2018   K 5.5 (H) 04/08/18   CL 111 08-Apr-2018   CO2 <7 (L) 04-08-18   GLUCOSE 131 (H) 04/08/2018   BUN 40 (H) Apr 08, 2018   CREATININE 5.64 (H) 2018/04/08   CALCIUM 5.5 (LL) 04-08-18        Impression/Plan:  1)  Refractory shock likely abd sepsis from bowel infarction vs translocation (not perf viscus) though haven't ruled out, surgery does not think re-exp would help and further support futile - fm desires w/d support in as comfortable a mode if possible so rec d/c all drips first, then chang to t bar vent on ms drip   2) Anuric acute renal failure secondary to #1 with incredibly leaky capillaries (cvp 3 despite Pos 7 liters) > not HD candidate  3) Vent dep resp faiure/consder wean to t bar p d/c drips    Discussed at length goals of care with fm at bedside, concur with CCS start MS now and focus on comfort issues      Christinia Gully, MD Pulmonary [facility]  [phone] After 5:30 PM or weekends, use Beeper [phone]

## 2018-04-11 NOTE — Discharge Summary (Addendum)
Physician Discharge Summary    Patient ID: Ricardo Villa MRN: [id number] DOB/AGE: 82-Jun-1936  84 y.o.  Patient Care Team: Leighton Ruff, MD as PCP - General (Family Medicine) Michael Boston, MD as Consulting Physician (General Surgery) Armbruster, Carlota Raspberry, MD as Consulting Physician (Gastroenterology) Dohmeier, Asencion Partridge, MD as Consulting Physician (Neurology)  Admit date: 03/26/2018  Discharge date: 04-10-2018 Hospital Stay = 4 days    Discharge Diagnoses:  Principal Problem:   Ileocolic anastomositic stricture s/p robotic resection 04/09/2018 Active Problems:   Crohn's disease of both small and large intestine with intestinal obstruction ([place])   AKI (acute kidney injury) ([place])   Ascending colon polyp s/p robotic resection 03/20/2018   Stricture of distal ileum s/p robotic resection 04/10/2018   Stricture of mid ileum s/p robotic enteroplasty 03/24/2018   Vitamin B12 deficiency   IRON DEFICIENCY ANEMIA, HX OF   History of colonic polyps   History of TIA (transient ischemic attack)   CKD (chronic kidney disease), stage II   Hypomagnesemia   Hypovolemic shock ([place])   Endotracheally intubated   Acute respiratory failure with hypoxia and hypercapnia ([facility])   Lactic acidosis   Acute-on-chronic renal failure ([place])   SURGERY 03/24/2018  POST-OPERATIVE DIAGNOSIS:   Ileocolonic anastomotic stricture  Ascending colon polyp unresectable by colonoscopy Distal Ileal stricture with enteroliths Mid ileal stricture  PROCEDURE:  XI ROBOT ASSISTED ILEOCOLECTOMY ILEAL ENTEROPLASTY ROBOTIC LYSIS OF ADHESIONS X 4 HOURS (2/3 CASE)  SURGEON:  Adin Hector, MD  Consults: pulmonary/intensive care  Hospital Course:   Patient with numerous prior abdominal surgeries.  History of Crohn's disease.  History of recurrent strictures.  Recurrent bowel obstructions that have mostly resolved nonoperatively but still has had readmissions in the past 2 years.  Has a persistently recurrent  polyp in the colon just proximal to his chronic ileocolonic stricture.  Felt not amenable to endoscopic resection given recurrence by prior endoscopic attempts.  Pretty good performance status and activity level.  Surgical consultation requested to resect chronic ileocolinic anastomotic stricture and nearby polyp.  Technique risk benefits alternatives discussed in detail.  Cardiac clearance obtained.    The patient underwent the surgery above.  An extra few hours of prolonged lysis adhesions from his prior surgery but ultimately able to robotically excise the ileocolonic region with chronic stricture and polyp as well as a enteroplasty of distinct midileal stricture.  No evidence of active Crohn's disease or inflammation.  Postoperatively, patient was stable on the floor.  He had a nasogastric tube placed given prolonged lysis adhesions with minimal NGT residuals.  Nasogastric tube removed and placed on  liquids.  An episode of discomfort the afternoon of postop day 1 resolved with 1 dose of medicine.  Postop day 2 he had an episode of emesis with relief.  Then had numerous bowel movements and felt much better.  Decided to keep him on liquids for now.  Denied any abdominal pain on postoperative day 2.  Unfortunately later in the morning postoperative day 2, he had a syncopal event while transferring from the bathroom commode after some bowel movements.  Nursing was there assisting and was able to witness.  Pulse could not be detected.  Code called.  CPR /ACLS performed & recovered.  Patient intubated.  Episode of emesis around that time of intubation, but no definite aspiration.  Transitioned intubated into the intensive care unit.  Follow-up later by a few hours later in the day, the patient was alert and following commands.  Denying abdominal pain.  Not on any pressors.  Creatinine had increased from his baseline kidney disease but he was not anuric.  Unfortunately over the next 24 hours he became more  hypotensive, requiring more aggressive resuscitation and pressors.  Oliguric.  Surgery, critical care, intensive care team intimately involved.  No evidence of abdominal distention or definite abdominal peritonitis.  Discussion made of repeat scanning vs reoperation if not improved.  However, he became completely anuric and full renal failure and worsening multisystem organ failure.  Discussions with family were made.  They felt it was not appropriate for reoperation to be attempted.  He continued to rapidly deteriorate.  Given his multisystem organ failure including complete renal failure and failure to improve despite aggressive intensive care treatment for 48 hours, family requested withdrawal support.  He succumbed on postoperative day 4.   Discharged Condition: DEATH  Discharge Exam: Blood pressure (!) 71/50, pulse (!) 108, temperature 99 F (37.2 C), temperature source Axillary, resp. rate (!) 30, height 6' (1.829 m), weight 90.9 kg, SpO2 92 %.  n/a   Disposition:         Allergies as of 04/16/2018   No Known Allergies     Medication List    ASK your doctor about these medications   aspirin EC 81 MG tablet Take 81 mg by mouth daily.   calcium carbonate 500 MG chewable tablet Commonly known as:  TUMS - dosed in mg elemental calcium Chew 1 tablet by mouth daily as needed for indigestion or heartburn.   diphenoxylate-atropine 2.5-0.025 MG tablet Commonly known as:  LOMOTIL TAKE ONE TABLET 1 OR 2 TIMES DAILY AS NEEDED FOR DIARRHEA OR LOOSE STOOLS   fenofibrate 54 MG tablet Take 54 mg by mouth daily.   guaiFENesin 600 MG 12 hr tablet Commonly known as:  MUCINEX Take 600 mg by mouth 2 (two) times daily as needed for cough or to loosen phlegm.   IRON PO Take 1 tablet by mouth daily.   latanoprost 0.005 % ophthalmic solution Commonly known as:  XALATAN Place 1 drop into both eyes at bedtime.   PRESERVISION AREDS 2 PO Take 1 tablet by mouth 2 (two) times daily.     vitamin B-12 1000 MCG tablet Commonly known as:  CYANOCOBALAMIN Take 1,000 mcg by mouth every 7 (seven) days.       Significant Diagnostic Studies:  No results found for this or any previous visit (from the past 72 hour(s)).  No results found.  Past Medical History:  Diagnosis Date  . Acute renal failure ([place]) 03/16/2014  . Allergy   . Cataract    been removed   . Crohn disease ([place])   . Dehydration 03/01/2013   Diarrhea due to crohn's disease.   . Diverticulosis   . GERD (gastroesophageal reflux disease)   . Glaucoma   . History of adenomatous polyp of colon   . Hyperhomocysteinemia ([place])   . Hyperlipidemia   . PONV (postoperative nausea and vomiting)    "due to lots of mucus and phelgm in throat"-all with colonoscopy and years ago with gall bladder surgery   . Prostate cancer [facility])    "watchful waiting"- rechecks every 3- 6 months- Dr. Alinda Money  . SBO (small bowel obstruction) ([place]) 03/17/2014  . Small bowel obstruction ([place])   . TIA    mini stroke    . TIA (transient ischemic attack)    HX OF   . Vitamin B12 deficiency     Past Surgical History:  Procedure Laterality Date  .  APPENDECTOMY    . BIOPSY  12/16/2017   Procedure: BIOPSY;  Surgeon: Yetta Flock, MD;  Location: [facility] ENDOSCOPY;  Service: Gastroenterology;;  . CATARACT EXTRACTION     both eyes  . CHOLECYSTECTOMY    . COLONOSCOPY    . COLONOSCOPY WITH PROPOFOL N/A 07/24/2015   Procedure: COLONOSCOPY WITH PROPOFOL;  Surgeon: Manus Gunning, MD;  Location: [facility] ENDOSCOPY;  Service: Gastroenterology;  Laterality: N/A;  . COLONOSCOPY WITH PROPOFOL N/A 12/12/2015   Procedure: COLONOSCOPY WITH PROPOFOL;  Surgeon: Manus Gunning, MD;  Location: [facility] ENDOSCOPY;  Service: Gastroenterology;  Laterality: N/A;  . COLONOSCOPY WITH PROPOFOL N/A 12/16/2017   Procedure: COLONOSCOPY WITH PROPOFOL;  Surgeon: Yetta Flock, MD;  Location: [facility] ENDOSCOPY;  Service: Gastroenterology;  Laterality: N/A;  .  HOT HEMOSTASIS N/A 12/12/2015   Procedure: HOT HEMOSTASIS (ARGON PLASMA COAGULATION/BICAP);  Surgeon: Manus Gunning, MD;  Location: [facility];  Service: Gastroenterology;  Laterality: N/A;  . POLYPECTOMY    . terminal ileum resection    . TONSILLECTOMY    . VASECTOMY      Social History   Socioeconomic History  . Marital status: Married    Spouse name: Occupational hygienist  . Number of children: 2  . Years of education: college  . Highest education level: Not on file  Occupational History  . Occupation: Retired  Scientific laboratory technician  . Financial resource strain: Not on file  . Food insecurity:    Worry: Not on file    Inability: Not on file  . Transportation needs:    Medical: Not on file    Non-medical: Not on file  Tobacco Use  . Smoking status: Former Smoker    Types: Cigarettes    Last attempt to quit: 07/16/1988    Years since quitting: 29.7  . Smokeless tobacco: Never Used  Substance and Sexual Activity  . Alcohol use: Yes    Alcohol/week: 0.0 standard drinks    Comment: occasionally to rare  . Drug use: No  . Sexual activity: Not Currently  Lifestyle  . Physical activity:    Days per week: Not on file    Minutes per session: Not on file  . Stress: Not on file  Relationships  . Social connections:    Talks on phone: Not on file    Gets together: Not on file    Attends religious service: Not on file    Active member of club or organization: Not on file    Attends meetings of clubs or organizations: Not on file    Relationship status: Not on file  . Intimate partner violence:    Fear of current or ex partner: Not on file    Emotionally abused: Not on file    Physically abused: Not on file    Forced sexual activity: Not on file  Other Topics Concern  . Not on file  Social History Narrative   Caffeine 2 cups daily. De caff tea    Family History  Problem Relation Age of Onset  . Crohn's disease Mother   . Heart disease Father   . Stroke Father   . Diabetes  Paternal Grandfather   . Colon cancer Neg Hx   . Esophageal cancer Neg Hx   . Rectal cancer Neg Hx   . Stomach cancer Neg Hx   . Colon polyps Neg Hx     No current facility-administered medications for this encounter.    Current Outpatient Medications  Medication Sig Dispense Refill  .  aspirin EC 81 MG tablet Take 81 mg by mouth daily.    . calcium carbonate (TUMS - DOSED IN MG ELEMENTAL CALCIUM) 500 MG chewable tablet Chew 1 tablet by mouth daily as needed for indigestion or heartburn.     . diphenoxylate-atropine (LOMOTIL) 2.5-0.025 MG tablet TAKE ONE TABLET 1 OR 2 TIMES DAILY AS NEEDED FOR DIARRHEA OR LOOSE STOOLS (Patient taking differently: Take 1 tablet by mouth daily. ) 60 tablet 0  . fenofibrate 54 MG tablet Take 54 mg by mouth daily.    Marland Kitchen guaiFENesin (MUCINEX) 600 MG 12 hr tablet Take 600 mg by mouth 2 (two) times daily as needed for cough or to loosen phlegm.    . IRON PO Take 1 tablet by mouth daily.    Marland Kitchen latanoprost (XALATAN) 0.005 % ophthalmic solution Place 1 drop into both eyes at bedtime.    . Multiple Vitamins-Minerals (PRESERVISION AREDS 2 PO) Take 1 tablet by mouth 2 (two) times daily.    . vitamin B-12 (CYANOCOBALAMIN) 1000 MCG tablet Take 1,000 mcg by mouth every 7 (seven) days.        No Known Allergies  Signed: Morton Peters, MD, FACS, MASCRS Gastrointestinal and Minimally Invasive Surgery    [address], [place], [place] [address] [phone] Main / Paging [phone] Fax   04/08/2018, 8:25 AM

## 2018-04-11 NOTE — Progress Notes (Addendum)
4 Days Post-Op   Subjective/Chief Complaint: Patient continued to worsen yesterday and overnight now in [facility]; max levo and vaso and systolic Bps remaining in 60s, remains severely acidotic despite bicarb gtt. His wife, 2 daughters and son in law are at the bedside this morning.    Objective: Vital signs in last 24 hours: Temp:  [98.6 F (37 C)-99.6 F (37.6 C)] 98.6 F (37 C) (01/19 0408) Pulse Rate:  [113-118] 113 (01/19 0305) Resp:  [31-34] 31 (01/19 0500) BP: (71-86)/(50-56) 71/50 (01/19 0305) SpO2:  [94 %-99 %] 97 % (01/19 0600) Arterial Line BP: (55-132)/(38-74) 60/39 (01/19 0645) FiO2 (%):  [40 %-60 %] 40 % (01/19 0305) Weight:  [90.9 kg] 90.9 kg (01/19 0500) Last BM Date: 03/28/18  Intake/Output from previous day: 01/18 0701 - 01/19 0700 In: 6979.4 [I.V.:6711.5; IV Piggyback:267.9] Out: 10 [Urine:10] Intake/Output this shift: No intake/output data recorded.  Intubated, unresponsive   Lab Results:  Recent Labs    03/28/18 0239 Apr 08, 2018 0409  WBC 18.0* 25.4*  HGB 10.7* 8.8*  HCT 35.9* 31.0*  PLT 206 132*   BMET Recent Labs    03/28/18 0239 04/08/2018 0409  NA 144 144  K 4.8 5.5*  CL 115* 111  CO2 11* <7*  GLUCOSE 117* 131*  BUN 35* 40*  CREATININE 4.20* 5.64*  CALCIUM 6.7* 5.5*   PT/INR No results for input(s): LABPROT, INR in the last 72 hours. ABG Recent Labs    03/28/18 0625 03/28/18 1030  PHART 7.118* 7.273*  HCO3 7.1* 9.9*    Studies/Results: Dg Chest Port 1 View  Result Date: 03/28/2018 CLINICAL DATA:  Respiratory distress EXAM: PORTABLE CHEST 1 VIEW COMPARISON:  03/27/2018 FINDINGS: Support Apparatus: --Endotracheal tube: Tip just below the level of the clavicular heads. 3.8 cm above the inferior margin of the carina. --Enteric tube:Tip and sideport are below the field of view. --Catheter(s):Left internal jugular vein approach central venous catheter tip is at the cavoatrial junction. --Other: None Right pleural effusion and associated  atelectasis. Mild left basilar atelectasis. IMPRESSION: Unchanged support apparatus, right pleural effusion and bibasilar atelectasis. Electronically Signed   By: Ulyses Jarred M.D.   On: 03/28/2018 00:03   Dg Chest Port 1 View  Result Date: 03/27/2018 CLINICAL DATA:  Intubation EXAM: PORTABLE CHEST 1 VIEW COMPARISON:  Portable exam 1150 hours compared to 1006 hours FINDINGS: Tip of endotracheal tube projects 4.1 cm above carina. LEFT jugular central venous catheter tip projects over SVC near cavoatrial junction, new since prior exam. Nasogastric tube extends into stomach. External pacing leads project over chest. Normal heart size, mediastinal contours, and pulmonary vascularity. Bibasilar atelectasis and questionable small RIGHT pleural effusion. No pneumothorax. Bones demineralized. Chest wall emphysema lateral LEFT chest extending into LEFT axilla and BILATERAL supraclavicular regions. IMPRESSION: Bibasilar atelectasis and questionable RIGHT pleural effusion. LEFT chest wall emphysema of uncertain etiology, extending into LEFT axilla and supraclavicular regions. Electronically Signed   By: Lavonia Dana M.D.   On: 03/27/2018 12:26   Dg Chest Port 1 View  Result Date: 03/27/2018 CLINICAL DATA:  Post intubation. EXAM: PORTABLE CHEST 1 VIEW COMPARISON:  Radiographs 07/29/2013. FINDINGS: 1006 hours. Endotracheal tube is in the mid trachea, tip 4.0 cm above the carina. Enteric tube projects below the diaphragm, tip not visualized. The heart size and mediastinal contours are stable. There are lower lung volumes with new right greater than left perihilar and lower lobe airspace opacities. No pneumothorax or large pleural effusion identified. Multiple telemetry leads overlie the chest. There appears to  be some soft tissue emphysema within the left lateral chest wall and neck bilaterally. This is presumably related to recent abdominal surgery. IMPRESSION: 1. Satisfactory position of the endotracheal and enteric  tubes. 2. Bibasilar atelectasis or infiltrates. 3. Soft tissue emphysema without evidence of pneumothorax. Electronically Signed   By: Richardean Sale M.D.   On: 03/27/2018 10:54   Dg Abd Portable 1v  Result Date: 03/27/2018 CLINICAL DATA:  Vomiting. EXAM: PORTABLE ABDOMEN - 1 VIEW COMPARISON:  Radiographs of February 09, 2015. FINDINGS: The bowel gas pattern is normal. Status post cholecystectomy. No radio-opaque calculi are seen. IMPRESSION: No evidence of bowel obstruction or ileus. Subcutaneous emphysema is noted bilaterally. Electronically Signed   By: Marijo Conception, M.D.   On: 03/27/2018 12:27    Anti-infectives: Anti-infectives (From admission, onward)   Start     Dose/Rate Route Frequency Ordered Stop   03/28/18 1800  piperacillin-tazobactam (ZOSYN) IVPB 2.25 g     2.25 g 100 mL/hr over 30 Minutes Intravenous Every 6 hours 03/28/18 1222     03/28/18 1200  piperacillin-tazobactam (ZOSYN) IVPB 3.375 g     3.375 g 100 mL/hr over 30 Minutes Intravenous  Once 03/28/18 1141 03/28/18 1246   03/26/18 0800  cefoTEtan (CEFOTAN) 2 g in sodium chloride 0.9 % 100 mL IVPB     2 g 200 mL/hr over 30 Minutes Intravenous Every 12 hours 03/26/18 0258 03/26/18 1127   03/15/2018 1902  clindamycin (CLEOCIN) 900 mg, gentamicin (GARAMYCIN) 240 mg in sodium chloride 0.9 % 1,000 mL for intraperitoneal lavage  Status:  Discontinued       As needed 04/09/2018 1903 03/19/2018 1938   03/31/2018 1400  neomycin (MYCIFRADIN) tablet 1,000 mg  Status:  Discontinued     1,000 mg Oral 3 times per day 03/21/2018 0953 03/22/2018 0954   03/20/2018 1400  metroNIDAZOLE (FLAGYL) tablet 1,000 mg  Status:  Discontinued     1,000 mg Oral 3 times per day 03/24/2018 0953 03/22/2018 0954   03/27/2018 1000  cefoTEtan (CEFOTAN) 2 g in sodium chloride 0.9 % 100 mL IVPB     2 g 200 mL/hr over 30 Minutes Intravenous On call to O.R. 03/28/2018 0953 03/28/2018 1919   04/07/2018 0600  clindamycin (CLEOCIN) 900 mg, gentamicin (GARAMYCIN) 240 mg in sodium  chloride 0.9 % 1,000 mL for intraperitoneal lavage  Status:  Discontinued      Intraperitoneal To Surgery 03/24/18 0802 03/24/2018 2106      Assessment/Plan: s/p Procedure(s) with comments: XI ROBOT ASSISTED PROXIMAL COLECTOMY WITH LYSIS OF ADHESIONS (N/A) - ERAS PATHWAY C/b cardiac arrest POD 2 with persistent shock Shock and multisystem organ failure have continued to worsen despite maximal supportive measures. Family is aware of the severity of his situation and understand that he is dying. They again do not wish for further operative intervention, and I do not think that returning to the operating room would have changed the outcome. They are wondering about how the next steps work, whether they should have pressors stopped or let things take their course. We discussed comfort measures. They are going to spend some more time with him this morning. Will initiate morphine gtt for comfort.    Appreciate thoughtful care from PCCM    LOS: 4 days    Ricardo Villa 03-31-2018

## 2018-04-11 NOTE — Progress Notes (Signed)
Pt is maxed out on levophed, with systolic in the 72B and MAP around 65. RN called E-link and notified Dr. Jimmy Footman and she is aware and said no further escalation in care. Will continue to monitor pt and update family.

## 2018-04-11 NOTE — Progress Notes (Signed)
Pt's wife, daughters, and sister were bedside. They said they spent the night. One daughter noted, her dad is going to heaven today... he is going to be with Jesus. At the time of our visit, they were waiting for a call from the pt's grandchildren in [place] in order for them to speak to pt. Although the family is sad and tearful, they are accepting of pt's impending transition. Please page if additional support is needed. [place], MDiv   04/07/18 1000  Clinical Encounter Type  Visited With Family

## 2018-04-11 NOTE — Progress Notes (Signed)
eLink Physician-Brief Progress Note Patient Name: Ricardo Villa DOB: 10-25-1934 MRN: [id number]   Date of Service  2018-04-27  HPI/Events of Note  Hypocalcemia  eICU Interventions  Calcium replaced     Intervention Category Intermediate Interventions: Electrolyte abnormality - evaluation and management  Harris Kistler Apr 27, 2018, 5:02 AM

## 2018-04-11 NOTE — Progress Notes (Signed)
CRITICAL VALUE ALERT  Critical Value:  Calcium 5.5  Date & Time Notied:  2018-04-09 0447  Provider Notified: Dr. Johnette Abraham. [place]   Orders Received/Actions taken: awaiting orders

## 2018-04-11 NOTE — Progress Notes (Signed)
Family decided to stop the pressors at 1138 pt. BP at the time was 33/20. Pt. Time of death was at 1143.

## 2018-04-11 NOTE — Progress Notes (Signed)
75 ml of morphine and 69m of versed were wasted with ZYehuda BuddRN.

## 2018-04-11 NOTE — Progress Notes (Signed)
Pt. Is maxed out on pressors BP is 50/35 family has decided they would like to make him comfortable and take him off of the drips. RN awaiting for family to say they are ready to turn the drips off. Morphine drip has started. RN will wait and continue to monitor.

## 2018-04-11 DEATH — deceased
